# Patient Record
Sex: Male | Born: 1945 | Race: White | Hispanic: No | Marital: Single | Smoking: Former smoker
Health system: Southern US, Community
[De-identification: ages and names within clinical notes are randomized; demographics above are authoritative.]

## PROBLEM LIST (undated history)

## (undated) DIAGNOSIS — I10 Essential (primary) hypertension: Secondary | ICD-10-CM

## (undated) DIAGNOSIS — I493 Ventricular premature depolarization: Secondary | ICD-10-CM

## (undated) DIAGNOSIS — Z720 Tobacco use: Secondary | ICD-10-CM

## (undated) DIAGNOSIS — I2510 Atherosclerotic heart disease of native coronary artery without angina pectoris: Secondary | ICD-10-CM

## (undated) DIAGNOSIS — I70219 Atherosclerosis of native arteries of extremities with intermittent claudication, unspecified extremity: Secondary | ICD-10-CM

## (undated) DIAGNOSIS — I509 Heart failure, unspecified: Secondary | ICD-10-CM

## (undated) DIAGNOSIS — J309 Allergic rhinitis, unspecified: Secondary | ICD-10-CM

## (undated) DIAGNOSIS — E7800 Pure hypercholesterolemia, unspecified: Secondary | ICD-10-CM

## (undated) HISTORY — DX: Pure hypercholesterolemia, unspecified: E78.00

## (undated) HISTORY — DX: Allergic rhinitis, unspecified: J30.9

## (undated) HISTORY — DX: Tobacco use: Z72.0

## (undated) HISTORY — DX: Essential (primary) hypertension: I10

## (undated) HISTORY — DX: Ventricular premature depolarization: I49.3

## (undated) HISTORY — DX: Atherosclerotic heart disease of native coronary artery without angina pectoris: I25.10

## (undated) HISTORY — DX: Atherosclerosis of native arteries of extremities with intermittent claudication, unspecified extremity: I70.219

---

## 1998-03-16 DIAGNOSIS — I2510 Atherosclerotic heart disease of native coronary artery without angina pectoris: Secondary | ICD-10-CM

## 1998-03-16 HISTORY — DX: Atherosclerotic heart disease of native coronary artery without angina pectoris: I25.10

## 1998-03-16 HISTORY — PX: CORONARY ARTERY BYPASS GRAFT: SHX141

## 1998-03-31 ENCOUNTER — Inpatient Hospital Stay (HOSPITAL_COMMUNITY): Admission: EM | Admit: 1998-03-31 | Discharge: 1998-05-21 | Payer: Self-pay | Admitting: Emergency Medicine

## 1998-04-01 ENCOUNTER — Encounter: Payer: Self-pay | Admitting: Cardiothoracic Surgery

## 1998-04-01 ENCOUNTER — Encounter: Payer: Self-pay | Admitting: Thoracic Surgery (Cardiothoracic Vascular Surgery)

## 1998-04-02 ENCOUNTER — Encounter: Payer: Self-pay | Admitting: Thoracic Surgery (Cardiothoracic Vascular Surgery)

## 1998-04-03 ENCOUNTER — Encounter: Payer: Self-pay | Admitting: Cardiothoracic Surgery

## 1998-04-04 ENCOUNTER — Encounter: Payer: Self-pay | Admitting: Cardiothoracic Surgery

## 1998-04-05 ENCOUNTER — Encounter: Payer: Self-pay | Admitting: Cardiothoracic Surgery

## 1998-04-05 ENCOUNTER — Encounter: Payer: Self-pay | Admitting: Emergency Medicine

## 1998-04-06 ENCOUNTER — Encounter: Payer: Self-pay | Admitting: Cardiothoracic Surgery

## 1998-04-07 ENCOUNTER — Encounter: Payer: Self-pay | Admitting: Thoracic Surgery (Cardiothoracic Vascular Surgery)

## 1998-04-08 ENCOUNTER — Encounter: Payer: Self-pay | Admitting: Cardiothoracic Surgery

## 1998-04-09 ENCOUNTER — Encounter: Payer: Self-pay | Admitting: Cardiothoracic Surgery

## 1998-04-10 ENCOUNTER — Encounter: Payer: Self-pay | Admitting: Cardiothoracic Surgery

## 1998-04-11 ENCOUNTER — Encounter: Payer: Self-pay | Admitting: Cardiothoracic Surgery

## 1998-04-12 ENCOUNTER — Encounter: Payer: Self-pay | Admitting: Cardiothoracic Surgery

## 1998-04-13 ENCOUNTER — Encounter: Payer: Self-pay | Admitting: Cardiothoracic Surgery

## 1998-04-14 ENCOUNTER — Encounter: Payer: Self-pay | Admitting: Thoracic Surgery (Cardiothoracic Vascular Surgery)

## 1998-04-14 ENCOUNTER — Encounter: Payer: Self-pay | Admitting: Cardiothoracic Surgery

## 1998-04-15 ENCOUNTER — Encounter: Payer: Self-pay | Admitting: Cardiothoracic Surgery

## 1998-04-16 ENCOUNTER — Encounter: Payer: Self-pay | Admitting: Cardiothoracic Surgery

## 1998-04-17 ENCOUNTER — Encounter: Payer: Self-pay | Admitting: Cardiothoracic Surgery

## 1998-04-18 ENCOUNTER — Encounter: Payer: Self-pay | Admitting: Cardiothoracic Surgery

## 1998-04-19 ENCOUNTER — Encounter: Payer: Self-pay | Admitting: Cardiothoracic Surgery

## 1998-04-20 ENCOUNTER — Encounter: Payer: Self-pay | Admitting: Cardiothoracic Surgery

## 1998-04-21 ENCOUNTER — Encounter: Payer: Self-pay | Admitting: Cardiothoracic Surgery

## 1998-04-21 ENCOUNTER — Encounter: Payer: Self-pay | Admitting: Pulmonary Disease

## 1998-04-22 ENCOUNTER — Encounter: Payer: Self-pay | Admitting: Cardiothoracic Surgery

## 1998-04-23 ENCOUNTER — Encounter: Payer: Self-pay | Admitting: Thoracic Surgery (Cardiothoracic Vascular Surgery)

## 1998-04-24 ENCOUNTER — Encounter: Payer: Self-pay | Admitting: Cardiothoracic Surgery

## 1998-04-25 ENCOUNTER — Encounter: Payer: Self-pay | Admitting: Cardiothoracic Surgery

## 1998-04-27 ENCOUNTER — Encounter: Payer: Self-pay | Admitting: Cardiothoracic Surgery

## 1998-05-01 ENCOUNTER — Encounter: Payer: Self-pay | Admitting: Pulmonary Disease

## 1998-05-15 ENCOUNTER — Encounter: Payer: Self-pay | Admitting: Cardiothoracic Surgery

## 1998-05-21 ENCOUNTER — Encounter: Payer: Self-pay | Admitting: Pulmonary Disease

## 1998-05-21 ENCOUNTER — Inpatient Hospital Stay: Admission: RE | Admit: 1998-05-21 | Discharge: 1998-06-05 | Payer: Self-pay | Admitting: Cardiology

## 1998-06-06 ENCOUNTER — Encounter: Admission: RE | Admit: 1998-06-06 | Discharge: 1998-06-24 | Payer: Self-pay | Admitting: Cardiothoracic Surgery

## 1998-06-26 ENCOUNTER — Encounter: Admission: RE | Admit: 1998-06-26 | Discharge: 1998-09-24 | Payer: Self-pay | Admitting: Cardiothoracic Surgery

## 1998-09-10 ENCOUNTER — Encounter (HOSPITAL_COMMUNITY): Admission: RE | Admit: 1998-09-10 | Discharge: 1998-12-09 | Payer: Self-pay | Admitting: Cardiothoracic Surgery

## 1998-12-02 ENCOUNTER — Encounter (HOSPITAL_COMMUNITY): Admission: RE | Admit: 1998-12-02 | Discharge: 1999-03-02 | Payer: Self-pay | Admitting: Cardiothoracic Surgery

## 1999-03-03 ENCOUNTER — Encounter (HOSPITAL_COMMUNITY): Admission: RE | Admit: 1999-03-03 | Discharge: 1999-06-01 | Payer: Self-pay | Admitting: Cardiothoracic Surgery

## 1999-06-02 ENCOUNTER — Encounter (HOSPITAL_COMMUNITY): Admission: RE | Admit: 1999-06-02 | Discharge: 1999-08-31 | Payer: Self-pay | Admitting: Cardiothoracic Surgery

## 2000-11-18 ENCOUNTER — Inpatient Hospital Stay (HOSPITAL_COMMUNITY): Admission: AC | Admit: 2000-11-18 | Discharge: 2000-11-24 | Payer: Self-pay

## 2000-11-18 ENCOUNTER — Encounter: Payer: Self-pay | Admitting: *Deleted

## 2000-11-19 ENCOUNTER — Encounter: Payer: Self-pay | Admitting: General Surgery

## 2000-11-20 ENCOUNTER — Encounter: Payer: Self-pay | Admitting: General Surgery

## 2000-11-23 ENCOUNTER — Encounter: Payer: Self-pay | Admitting: Neurological Surgery

## 2000-12-24 ENCOUNTER — Encounter: Payer: Self-pay | Admitting: Neurological Surgery

## 2000-12-24 ENCOUNTER — Ambulatory Visit (HOSPITAL_COMMUNITY): Admission: RE | Admit: 2000-12-24 | Discharge: 2000-12-24 | Payer: Self-pay | Admitting: Neurological Surgery

## 2004-11-04 ENCOUNTER — Ambulatory Visit (HOSPITAL_COMMUNITY): Admission: RE | Admit: 2004-11-04 | Discharge: 2004-11-04 | Payer: Self-pay | Admitting: Gastroenterology

## 2006-01-19 ENCOUNTER — Ambulatory Visit: Payer: Self-pay | Admitting: Oncology

## 2013-01-01 ENCOUNTER — Other Ambulatory Visit: Payer: Self-pay | Admitting: Cardiology

## 2013-01-03 ENCOUNTER — Encounter: Payer: Self-pay | Admitting: Cardiology

## 2013-01-03 ENCOUNTER — Encounter: Payer: Self-pay | Admitting: *Deleted

## 2013-01-04 ENCOUNTER — Encounter: Payer: Self-pay | Admitting: Cardiology

## 2013-01-04 DIAGNOSIS — I2510 Atherosclerotic heart disease of native coronary artery without angina pectoris: Secondary | ICD-10-CM | POA: Insufficient documentation

## 2013-01-04 DIAGNOSIS — Z720 Tobacco use: Secondary | ICD-10-CM | POA: Insufficient documentation

## 2013-01-04 DIAGNOSIS — E785 Hyperlipidemia, unspecified: Secondary | ICD-10-CM | POA: Insufficient documentation

## 2013-01-04 DIAGNOSIS — I10 Essential (primary) hypertension: Secondary | ICD-10-CM | POA: Insufficient documentation

## 2013-01-05 ENCOUNTER — Encounter (INDEPENDENT_AMBULATORY_CARE_PROVIDER_SITE_OTHER): Payer: Self-pay

## 2013-01-05 ENCOUNTER — Ambulatory Visit (INDEPENDENT_AMBULATORY_CARE_PROVIDER_SITE_OTHER): Payer: Medicare Other | Admitting: Cardiology

## 2013-01-05 ENCOUNTER — Encounter: Payer: Self-pay | Admitting: Cardiology

## 2013-01-05 VITALS — BP 118/64 | HR 49 | Ht 63.0 in | Wt 124.0 lb

## 2013-01-05 DIAGNOSIS — F17200 Nicotine dependence, unspecified, uncomplicated: Secondary | ICD-10-CM

## 2013-01-05 DIAGNOSIS — I25810 Atherosclerosis of coronary artery bypass graft(s) without angina pectoris: Secondary | ICD-10-CM

## 2013-01-05 DIAGNOSIS — I10 Essential (primary) hypertension: Secondary | ICD-10-CM

## 2013-01-05 DIAGNOSIS — I2510 Atherosclerotic heart disease of native coronary artery without angina pectoris: Secondary | ICD-10-CM

## 2013-01-05 DIAGNOSIS — Z720 Tobacco use: Secondary | ICD-10-CM

## 2013-01-05 DIAGNOSIS — E7800 Pure hypercholesterolemia, unspecified: Secondary | ICD-10-CM

## 2013-01-05 NOTE — Patient Instructions (Addendum)
Your physician recommends that you continue on your current medications as directed. Please refer to the Current Medication list given to you today. Your physician wants you to follow-up in: 12 months with Dr. Turner.  You will receive a reminder letter in the mail two months in advance. If you don't receive a letter, please call our office to schedule the follow-up appointment.  

## 2013-01-05 NOTE — Progress Notes (Signed)
[address] [place], [place]  [address] Phone: [phone] Fax:  [phone]  Date:  01/05/2013   ID:  Derrick Marsh, DOB September 10, 1945, MRN [id number]  PCP:  No primary provider on file.  Cardiologist:  Armanda Magic, MD     History of Present Illness: Derrick Marsh is a 67 y.o. male with a history of CAD s/p CABG, HTN, dyslipidemia and ongoing tobacco abuse.  He is doing well.  He denies any chest pain, SOB, DOE, LEedema, dizziness, palpitations or syncope.   Wt Readings from Last 3 Encounters:  No data found for Wt     Past Medical History  Diagnosis Date  . Allergic rhinitis   . Atherosclerosis of native arteries of the extremities with intermittent claudication   . Tobacco abuse   . CAD (coronary artery disease) 2000    s/p acute IWMI with vfib arrest s/p CABG with LIMA to LAD, SVG to OM, SVG to RCA  . Hypercholesteremia   . HTN (hypertension)     Current Outpatient Prescriptions  Medication Sig Dispense Refill  . albuterol (PROVENTIL HFA;VENTOLIN HFA) 108 (90 BASE) MCG/ACT inhaler Inhale 2 puffs into the lungs every 4 (four) hours as needed for wheezing.      Marland Kitchen aspirin 325 MG tablet Take 325 mg by mouth daily.      Marland Kitchen atorvastatin (LIPITOR) 40 MG tablet TAKE 1 TABLET BY MOUTH EVERY DAY  30 tablet  11  . colchicine 0.6 MG tablet Take 0.6 mg by mouth daily.      Marland Kitchen ezetimibe (ZETIA) 10 MG tablet Take 10 mg by mouth daily.      . metoprolol tartrate (LOPRESSOR) 25 MG tablet Take 25 mg by mouth 2 (two) times daily.      . tadalafil (CIALIS) 20 MG tablet Take 20 mg by mouth daily as needed for erectile dysfunction.      . traMADol-acetaminophen (ULTRACET) 37.5-325 MG per tablet Take 1 tablet by mouth every 6 (six) hours as needed for pain.       No current facility-administered medications for this visit.    Allergies:   No Known Allergies  Social History:  The patient  reports that he has been smoking.  He does not have any smokeless tobacco history on  file. He reports that he does not drink alcohol or use illicit drugs.   Family History:  The patient's family history includes CVA in his father; Emphysema in his father; Heart disease in his father; Heart failure in his mother.   ROS:  Please see the history of present illness.      All other systems reviewed and negative.   PHYSICAL EXAM: VS:  There were no vitals taken for this visit. Well nourished, well developed, in no acute distress HEENT: normal Neck: no JVD Cardiac:  normal S1, S2; RRR; no murmur Lungs:  clear to auscultation bilaterally, no wheezing, rhonchi or rales Abd: soft, nontender, no hepatomegaly Ext: no edema Skin: warm and dry Neuro:  CNs 2-12 intact, no focal abnormalities noted   EKG:  Sinus bradycardia at 49bpm with incomplete LBBB  ASSESSMENT AND PLAN:  1. CAD s/p CABG with no angina  - continue ASA/metoprolol 2. HTN controlled  - continue metoprolol 3. Dyslipidemia  - continue Atorvastatin  - he stopped taking the Zetia - he says he feels better now  - he will have Dr. Nehemiah Settle send a copy of his lipids next month 4.  Tobacco abuse  -  we talked about quitting but he is not ready.  He is down to about 3 cigars daily  Followup with me in 1 year  Signed, Armanda Magic, MD 01/05/2013 11:12 AM

## 2013-01-22 ENCOUNTER — Other Ambulatory Visit: Payer: Self-pay | Admitting: Cardiology

## 2013-04-15 ENCOUNTER — Encounter: Payer: Self-pay | Admitting: *Deleted

## 2013-05-30 ENCOUNTER — Other Ambulatory Visit: Payer: Self-pay

## 2013-05-30 MED ORDER — METOPROLOL TARTRATE 25 MG PO TABS: ORAL_TABLET | ORAL | Status: DC

## 2013-05-30 MED ORDER — ATORVASTATIN CALCIUM 40 MG PO TABS: ORAL_TABLET | ORAL | Status: DC

## 2013-06-30 ENCOUNTER — Encounter: Payer: Self-pay | Admitting: Cardiology

## 2013-12-04 ENCOUNTER — Other Ambulatory Visit: Payer: Self-pay

## 2013-12-04 MED ORDER — ATORVASTATIN CALCIUM 40 MG PO TABS: ORAL_TABLET | ORAL | Status: DC

## 2014-01-09 ENCOUNTER — Ambulatory Visit (INDEPENDENT_AMBULATORY_CARE_PROVIDER_SITE_OTHER): Payer: Medicare Other | Admitting: Cardiology

## 2014-01-09 ENCOUNTER — Encounter: Payer: Self-pay | Admitting: Cardiology

## 2014-01-09 VITALS — BP 124/72 | HR 56 | Ht 63.0 in | Wt 118.0 lb

## 2014-01-09 DIAGNOSIS — E7800 Pure hypercholesterolemia, unspecified: Secondary | ICD-10-CM

## 2014-01-09 DIAGNOSIS — Z720 Tobacco use: Secondary | ICD-10-CM

## 2014-01-09 DIAGNOSIS — I2584 Coronary atherosclerosis due to calcified coronary lesion: Secondary | ICD-10-CM

## 2014-01-09 DIAGNOSIS — I10 Essential (primary) hypertension: Secondary | ICD-10-CM

## 2014-01-09 DIAGNOSIS — I2510 Atherosclerotic heart disease of native coronary artery without angina pectoris: Secondary | ICD-10-CM

## 2014-01-09 NOTE — Progress Notes (Signed)
[address] [place], [place]  [address] Phone: [phone] Fax:  [phone]  Date:  01/09/2014   ID:  Derrick Marsh, DOB 02-17-1946, MRN [id number]  PCP:  No primary provider on file.  Cardiologist:  Armanda Magicraci Kmya Placide, MD    History of Present Illness: Derrick Marsh is a 68 y.o. male with a history of CAD s/p CABG, HTN, dyslipidemia and ongoing tobacco abuse. He is doing well. He denies any chest pain, SOB, DOE, LEedema, dizziness, palpitations or syncope.    Wt Readings from Last 3 Encounters:  01/09/14 118 lb (53.524 kg)  01/05/13 124 lb (56.246 kg)     Past Medical History  Diagnosis Date  . Allergic rhinitis   . Atherosclerosis of native arteries of the extremities with intermittent claudication   . Tobacco abuse   . CAD (coronary artery disease) 2000    s/p acute IWMI with vfib arrest s/p CABG with LIMA to LAD, SVG to OM, SVG to RCA  . Hypercholesteremia   . HTN (hypertension)     Current Outpatient Prescriptions  Medication Sig Dispense Refill  . albuterol (PROVENTIL HFA;VENTOLIN HFA) 108 (90 BASE) MCG/ACT inhaler Inhale 2 puffs into the lungs every 4 (four) hours as needed for wheezing.      Marland Kitchen. aspirin 325 MG tablet Take 325 mg by mouth daily.      Marland Kitchen. atorvastatin (LIPITOR) 40 MG tablet TAKE 1 TABLET BY MOUTH EVERY DAY  90 tablet  1  . colchicine 0.6 MG tablet Take 0.6 mg by mouth daily.      . metoprolol tartrate (LOPRESSOR) 25 MG tablet TAKE 0.5 TABS BY MOUTH TWICE A DAY  90 tablet  1  . tadalafil (CIALIS) 20 MG tablet Take 20 mg by mouth daily as needed for erectile dysfunction.      . traMADol-acetaminophen (ULTRACET) 37.5-325 MG per tablet Take 1 tablet by mouth every 6 (six) hours as needed for pain.       No current facility-administered medications for this visit.    Allergies:   No Known Allergies  Social History:  The patient  reports that he has been smoking Cigars.  He does not have any smokeless tobacco history on file. He reports that he  does not drink alcohol or use illicit drugs.   Family History:  The patient's family history includes CVA in his father; Emphysema in his father; Heart disease in his father; Heart failure in his mother.   ROS:  Please see the history of present illness.      All other systems reviewed and negative.   PHYSICAL EXAM: VS:  BP 124/72  Pulse 56  Ht 5\' 3"  (1.6 m)  Wt 118 lb (53.524 kg)  BMI 20.91 kg/m2 Well nourished, well developed, in no acute distress HEENT: normal Neck: no JVD Cardiac:  normal S1, S2; RRR; no murmur Lungs:  clear to auscultation bilaterally, no wheezing, rhonchi or rales Abd: soft, nontender, no hepatomegaly Ext: no edema Skin: warm and dry Neuro:  CNs 2-12 intact, no focal abnormalities noted  EKG:     NSR with nonspecific T wave abnormality in inferolateral leads - unchanged from 1 year ago  ASSESSMENT AND PLAN:  1. CAD s/p CABG with no angina - continue ASA/metoprolol  2. HTN controlled - continue metoprolol  3. Dyslipidemia - continue Atorvastatin  - I will get his most recent lipids from his PCP 4. Tobacco abuse  - we talked about quitting but he is not  ready. He is down to about 3 cigars daily   Followup with me in 1 year    Signed, Armanda Magicraci Manual Navarra, MD [facility] 01/09/2014 3:37 PM

## 2014-01-09 NOTE — Patient Instructions (Signed)
Your physician wants you to follow-up in: 1 year with Dr. Turner. You will receive a reminder letter in the mail two months in advance. If you don't receive a letter, please call our office to schedule the follow-up appointment.  Your physician recommends that you continue on your current medications as directed. Please refer to the Current Medication list given to you today.  

## 2014-02-12 ENCOUNTER — Encounter: Payer: Self-pay | Admitting: Cardiology

## 2014-05-17 ENCOUNTER — Other Ambulatory Visit: Payer: Self-pay | Admitting: Cardiology

## 2014-05-23 ENCOUNTER — Other Ambulatory Visit: Payer: Self-pay | Admitting: Interventional Cardiology

## 2014-10-18 ENCOUNTER — Encounter: Payer: Self-pay | Admitting: Cardiology

## 2014-11-08 ENCOUNTER — Other Ambulatory Visit: Payer: Self-pay | Admitting: Cardiology

## 2014-11-18 ENCOUNTER — Other Ambulatory Visit: Payer: Self-pay | Admitting: Interventional Cardiology

## 2015-01-09 NOTE — Progress Notes (Signed)
Cardiology Office Note   Date:  01/10/2015   ID:  Derrick Marsh, DOB 1945-11-09, MRN [id number]  PCP:  Katy Apo, MD    Chief Complaint  Patient presents with  . Coronary Artery Disease  . Hypertension      History of Present Illness: Derrick Marsh is a 68 y.o. male with a history of CAD s/p CABG, HTN, dyslipidemia and ongoing tobacco abuse. He is doing well. He denies any chest pain, SOB (escept with allergies), DOE, LEedema, dizziness, palpitations or syncope.      Past Medical History  Diagnosis Date  . Allergic rhinitis   . Atherosclerosis of native arteries of the extremities with intermittent claudication   . Tobacco abuse   . CAD (coronary artery disease) 2000    s/p acute IWMI with vfib arrest s/p CABG with LIMA to LAD, SVG to OM, SVG to RCA  . Hypercholesteremia   . HTN (hypertension)     Past Surgical History  Procedure Laterality Date  . Coronary artery bypass graft  2000     Current Outpatient Prescriptions  Medication Sig Dispense Refill  . albuterol (PROVENTIL HFA;VENTOLIN HFA) 108 (90 BASE) MCG/ACT inhaler Inhale 2 puffs into the lungs every 4 (four) hours as needed for wheezing.    Marland Kitchen aspirin 325 MG tablet Take 325 mg by mouth daily.    Marland Kitchen atorvastatin (LIPITOR) 40 MG tablet TAKE 1 TABLET BY MOUTH EVERY DAY 90 tablet 0  . colchicine 0.6 MG tablet Take 0.6 mg by mouth daily.    . metoprolol tartrate (LOPRESSOR) 25 MG tablet TAKE 1/2 TABLET BY MOUTH TWICE DAILY 90 tablet 1   No current facility-administered medications for this visit.    Allergies:   Review of patient's allergies indicates no known allergies.    Social History:  The patient  reports that he has been smoking Cigars.  He does not have any smokeless tobacco history on file. He reports that he does not drink alcohol or use illicit drugs.   Family History:  The patient's family history includes CVA in his father; Emphysema in his father; Heart disease in his  father; Heart failure in his mother.    ROS:  Please see the history of present illness.   Otherwise, review of systems are positive for none.   All other systems are reviewed and negative.    PHYSICAL EXAM: VS:  BP 140/72 mmHg  Pulse 56  Ht  (1.575 m)  Wt 115 lb (52.164 kg)  BMI 21.03 kg/m2 , BMI Body mass index is 21.03 kg/(m^2). GEN: Well nourished, well developed, in no acute distress HEENT: normal Neck: no JVD, carotid bruits, or masses Cardiac: RRR; no murmurs, rubs, or gallops,no edema  Respiratory:  clear to auscultation bilaterally, normal work of breathing GI: soft, nontender, nondistended, + BS MS: no deformity or atrophy Skin: warm and dry, no rash Neuro:  Strength and sensation are intact Psych: euthymic mood, full affect   EKG:  EKG was ordered today and showed sinus bradycardia at 56bpm with nonspecific IVCD unchanged from EKG 2015   Recent Labs: No results found for requested labs within last 365 days.    Lipid Panel No results found for: CHOL, TRIG, HDL, CHOLHDL, VLDL, LDLCALC, LDLDIRECT    Wt Readings from Last 3 Encounters:  01/10/15 115 lb (52.164 kg)  01/09/14 118 lb (53.524 kg)  01/05/13 124 lb (56.246  kg)    ASSESSMENT AND PLAN:  1.  CAD s/p CABG with no angina - continue ASA/metoprolol/statin 2.  HTN controlled - continue metoprolol 3.  Dyslipidemia - continue Atorvastatin  - I will check an FLP and ALT today 4. Tobacco abuse  - we talked about quitting but he is not ready. He is down to about 3 cigars daily  5.  CKD - he was supposed to see a renal MD but did not go.  I will check a BMET today.      Current medicines are reviewed at length with the patient today.  The patient does not have concerns regarding medicines.  The following changes have been made:  no change  Labs/ tests ordered today: See above Assessment and Plan No orders of the defined types were placed in this encounter.     Disposition:   FU with me in 1  year  Signed, Quintella ReichertURNER,Gabrielle Wakeland R, MD  01/10/2015 11:25 AM    [facility] [address], [place], [place]  [address] Phone: [phone](336) [phone]; Fax: [phone]

## 2015-01-10 ENCOUNTER — Ambulatory Visit (INDEPENDENT_AMBULATORY_CARE_PROVIDER_SITE_OTHER): Payer: Medicare Other | Admitting: Cardiology

## 2015-01-10 ENCOUNTER — Encounter: Payer: Self-pay | Admitting: Cardiology

## 2015-01-10 VITALS — BP 140/72 | HR 56 | Ht 62.0 in | Wt 115.0 lb

## 2015-01-10 DIAGNOSIS — Z720 Tobacco use: Secondary | ICD-10-CM

## 2015-01-10 DIAGNOSIS — I2583 Coronary atherosclerosis due to lipid rich plaque: Principal | ICD-10-CM

## 2015-01-10 DIAGNOSIS — I2510 Atherosclerotic heart disease of native coronary artery without angina pectoris: Secondary | ICD-10-CM

## 2015-01-10 DIAGNOSIS — E7800 Pure hypercholesterolemia, unspecified: Secondary | ICD-10-CM | POA: Diagnosis not present

## 2015-01-10 DIAGNOSIS — I10 Essential (primary) hypertension: Secondary | ICD-10-CM | POA: Diagnosis not present

## 2015-01-10 LAB — BASIC METABOLIC PANEL
BUN: 15 mg/dL (ref 7–25)
CO2: 27 mmol/L (ref 20–31)
Calcium: 9.4 mg/dL (ref 8.6–10.3)
Chloride: 107 mmol/L (ref 98–110)
Creat: 1.64 mg/dL — ABNORMAL HIGH (ref 0.70–1.25)
GLUCOSE: 92 mg/dL (ref 65–99)
Potassium: 4.5 mmol/L (ref 3.5–5.3)
Sodium: 141 mmol/L (ref 135–146)

## 2015-01-10 LAB — HEPATIC FUNCTION PANEL
ALBUMIN: 3.9 g/dL (ref 3.6–5.1)
ALT: 13 U/L (ref 9–46)
AST: 18 U/L (ref 10–35)
Alkaline Phosphatase: 97 U/L (ref 40–115)
Bilirubin, Direct: 0.1 mg/dL (ref <=0.2)
Indirect Bilirubin: 0.3 mg/dL (ref 0.2–1.2)
TOTAL PROTEIN: 7.2 g/dL (ref 6.1–8.1)
Total Bilirubin: 0.4 mg/dL (ref 0.2–1.2)

## 2015-01-10 LAB — LIPID PANEL
CHOLESTEROL: 131 mg/dL (ref 125–200)
HDL: 43 mg/dL (ref >=40)
LDL Cholesterol: 69 mg/dL (ref <130)
Total CHOL/HDL Ratio: 3 Ratio (ref <=5.0)
Triglycerides: 95 mg/dL (ref <150)
VLDL: 19 mg/dL (ref <30)

## 2015-01-10 NOTE — Patient Instructions (Addendum)

## 2015-02-16 ENCOUNTER — Other Ambulatory Visit: Payer: Self-pay | Admitting: Cardiology

## 2015-02-25 ENCOUNTER — Other Ambulatory Visit: Payer: Self-pay | Admitting: Nephrology

## 2015-02-25 DIAGNOSIS — N183 Chronic kidney disease, stage 3 unspecified: Secondary | ICD-10-CM

## 2015-02-28 ENCOUNTER — Ambulatory Visit
Admission: RE | Admit: 2015-02-28 | Discharge: 2015-02-28 | Disposition: A | Discharge status: Home / Self Care | Payer: Medicare Other | Source: Ambulatory Visit | Attending: Nephrology | Admitting: Nephrology

## 2015-02-28 DIAGNOSIS — N183 Chronic kidney disease, stage 3 unspecified: Secondary | ICD-10-CM

## 2015-04-09 ENCOUNTER — Other Ambulatory Visit: Payer: Self-pay | Admitting: Nephrology

## 2015-04-09 DIAGNOSIS — N183 Chronic kidney disease, stage 3 unspecified: Secondary | ICD-10-CM

## 2015-04-11 ENCOUNTER — Other Ambulatory Visit: Payer: Medicare Other

## 2015-05-02 ENCOUNTER — Other Ambulatory Visit: Payer: Self-pay | Admitting: Cardiology

## 2015-09-09 ENCOUNTER — Ambulatory Visit
Admission: RE | Admit: 2015-09-09 | Discharge: 2015-09-09 | Disposition: A | Discharge status: Home / Self Care | Payer: Medicare Other | Source: Ambulatory Visit | Attending: Nephrology | Admitting: Nephrology

## 2015-09-09 DIAGNOSIS — N183 Chronic kidney disease, stage 3 unspecified: Secondary | ICD-10-CM

## 2016-01-10 ENCOUNTER — Encounter (INDEPENDENT_AMBULATORY_CARE_PROVIDER_SITE_OTHER): Payer: Self-pay

## 2016-01-10 ENCOUNTER — Encounter: Payer: Self-pay | Admitting: Cardiology

## 2016-01-10 ENCOUNTER — Ambulatory Visit (INDEPENDENT_AMBULATORY_CARE_PROVIDER_SITE_OTHER): Payer: Medicare Other | Admitting: Cardiology

## 2016-01-10 VITALS — BP 134/80 | HR 62 | Ht 62.0 in | Wt 118.4 lb

## 2016-01-10 DIAGNOSIS — I10 Essential (primary) hypertension: Secondary | ICD-10-CM

## 2016-01-10 DIAGNOSIS — E7800 Pure hypercholesterolemia, unspecified: Secondary | ICD-10-CM

## 2016-01-10 DIAGNOSIS — I2510 Atherosclerotic heart disease of native coronary artery without angina pectoris: Secondary | ICD-10-CM

## 2016-01-10 DIAGNOSIS — I493 Ventricular premature depolarization: Secondary | ICD-10-CM | POA: Insufficient documentation

## 2016-01-10 LAB — HEPATIC FUNCTION PANEL
ALBUMIN: 4 g/dL (ref 3.6–5.1)
ALT: 11 U/L (ref 9–46)
AST: 19 U/L (ref 10–35)
Alkaline Phosphatase: 84 U/L (ref 40–115)
Bilirubin, Direct: 0.1 mg/dL (ref <=0.2)
Indirect Bilirubin: 0.4 mg/dL (ref 0.2–1.2)
TOTAL PROTEIN: 6.9 g/dL (ref 6.1–8.1)
Total Bilirubin: 0.5 mg/dL (ref 0.2–1.2)

## 2016-01-10 LAB — LIPID PANEL
CHOLESTEROL: 128 mg/dL (ref 125–200)
HDL: 52 mg/dL (ref >=40)
LDL Cholesterol: 65 mg/dL (ref <130)
Total CHOL/HDL Ratio: 2.5 Ratio (ref <=5.0)
Triglycerides: 54 mg/dL (ref <150)
VLDL: 11 mg/dL (ref <30)

## 2016-01-10 NOTE — Patient Instructions (Signed)
Medication Instructions:  Your physician recommends that you continue on your current medications as directed. Please refer to the Current Medication list given to you today.   Labwork: TODAY: LFTs, Lipids  Testing/Procedures: None  Follow-Up: Your physician wants you to follow-up in: 1 year with Dr. Turner. You will receive a reminder letter in the mail two months in advance. If you don't receive a letter, please call our office to schedule the follow-up appointment.   Any Other Special Instructions Will Be Listed Below (If Applicable).     If you need a refill on your cardiac medications before your next appointment, please call your pharmacy.   

## 2016-01-10 NOTE — Progress Notes (Signed)
Cardiology Office Note    Date:  01/10/2016   ID:  Derrick Marsh, DOB [birth year], MRN [id number]  PCP:  Katy ApoPOLITE,RONALD D, MD  Cardiologist:  Armanda Magicraci Turner, MD   Chief Complaint  Patient presents with  . Hypertension  . Hyperlipidemia  . Coronary Artery Disease    History of Present Illness:  Derrick Marsh is a 70 y.o. male with a history of CAD s/p CABG, HTN, dyslipidemia and ongoing tobacco abuse. He is doing well. He denies any chest pain, SOB (except with allergies or extreme exertion), PND, orthopnea, LEedema, dizziness, palpitations or syncope.    Past Medical History:  Diagnosis Date  . Allergic rhinitis   . Atherosclerosis of native arteries of the extremities with intermittent claudication   . 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 HTN (hypertension)   . Hypercholesteremia   . PVC's (premature ventricular contractions)   . Tobacco abuse     Past Surgical History:  Procedure Laterality Date  . CORONARY ARTERY BYPASS GRAFT  2000    Current Medications: Outpatient Medications Prior to Visit  Medication Sig Dispense Refill  . aspirin 325 MG tablet Take 325 mg by mouth daily.    Marland Kitchen. atorvastatin (LIPITOR) 40 MG tablet TAKE 1 TABLET BY MOUTH EVERY DAY 90 tablet 3  . colchicine 0.6 MG tablet Take 0.6 mg by mouth daily.    . metoprolol tartrate (LOPRESSOR) 25 MG tablet TAKE 1/2 TABLET BY MOUTH TWICE DAILY 90 tablet 2  . albuterol (PROVENTIL HFA;VENTOLIN HFA) 108 (90 BASE) MCG/ACT inhaler Inhale 2 puffs into the lungs every 4 (four) hours as needed for wheezing.     No facility-administered medications prior to visit.      Allergies:   Review of patient's allergies indicates no known allergies.   Social History   Social History  . Marital status: Single    Spouse name: N/A  . Number of children: N/A  . Years of education: N/A   Social History Main Topics  . Smoking status:  Current Some Day Smoker    Packs/day: 0.50    Types: Cigars  . Smokeless tobacco: Never Used  . Alcohol use No  . Drug use: No  . Sexual activity: Not Asked   Other Topics Concern  . None   Social History Narrative  . None     Family History:  The patient's family history includes CVA in his father; Emphysema in his father; Heart disease in his father; Heart failure in his mother.   ROS:   Please see the history of present illness.    ROS All other systems reviewed and are negative.  No flowsheet data found.     PHYSICAL EXAM:   VS:  BP 134/80   Pulse 62   Ht 5\' 2"  (1.575 m)   Wt 118 lb 6.4 oz (53.7 kg)   BMI 21.66 kg/m    GEN: Well nourished, well developed, in no acute distress  HEENT: normal  Neck: no JVD, carotid bruits, or masses Cardiac: RRR; no murmurs, rubs, or gallops,no edema.  Intact distal pulses bilaterally.  Respiratory:  clear to auscultation bilaterally, normal work of breathing GI: soft, nontender, nondistended, + BS MS: no deformity or atrophy  Skin: warm and dry, no rash Neuro:  Alert and Oriented x 3, Strength and sensation are intact Psych: euthymic mood, full affect  Wt Readings  from Last 3 Encounters:  01/10/16 118 lb 6.4 oz (53.7 kg)  01/10/15 115 lb (52.2 kg)  01/09/14 118 lb (53.5 kg)      Studies/Labs Reviewed:   EKG:  EKG is  ordered today.  The ekg ordered today demonstrates NSR at 62bpm with PVCs and LVH with repolarization abnormality unchanged from 12/2014  Recent Labs: 01/10/2015: ALT 13; BUN 15; Creat 1.64; Potassium 4.5; Sodium 141   Lipid Panel    Component Value Date/Time   CHOL 131 01/10/2015 1148   TRIG 95 01/10/2015 1148   HDL 43 01/10/2015 1148   CHOLHDL 3.0 01/10/2015 1148   VLDL 19 01/10/2015 1148   LDLCALC 69 01/10/2015 1148    Additional studies/ records that were reviewed today include:  none    ASSESSMENT:    1. Coronary artery disease involving native coronary artery of native heart without  angina pectoris   2. Essential hypertension   3. Hypercholesteremia   4. PVC's (premature ventricular contractions)      PLAN:  In order of problems listed above:  1. ASCAD s/p remote CABG.  He has no anginal symptoms.  Continue ASA/BB and statin.  Decrease ASA to 81mg  daily.   2. HTN - BP controlled on current meds.  Continue BB. 3. Hyperlipidemia - LDL goal < 70.  Continue statin.  Check FLP and ALT.    Medication Adjustments/Labs and Tests Ordered: Current medicines are reviewed at length with the patient today.  Concerns regarding medicines are outlined above.  Medication changes, Labs and Tests ordered today are listed in the Patient Instructions below.  There are no Patient Instructions on file for this visit.   Signed, Armanda Magic, MD  01/10/2016 10:07 AM    [facility] [address] [place], [place], [place]  [address] Phone: [phone]; Fax: [phone]

## 2016-01-16 ENCOUNTER — Other Ambulatory Visit: Payer: Self-pay | Admitting: Cardiology

## 2016-01-31 ENCOUNTER — Other Ambulatory Visit: Payer: Self-pay | Admitting: Cardiology

## 2016-12-27 ENCOUNTER — Other Ambulatory Visit: Payer: Self-pay | Admitting: Cardiology

## 2017-01-06 ENCOUNTER — Encounter: Payer: Self-pay | Admitting: Cardiology

## 2017-01-06 ENCOUNTER — Ambulatory Visit (INDEPENDENT_AMBULATORY_CARE_PROVIDER_SITE_OTHER): Payer: Medicare Other | Admitting: Cardiology

## 2017-01-06 VITALS — BP 134/72 | HR 57 | Ht 62.0 in | Wt 111.0 lb

## 2017-01-06 DIAGNOSIS — I10 Essential (primary) hypertension: Secondary | ICD-10-CM

## 2017-01-06 DIAGNOSIS — I493 Ventricular premature depolarization: Secondary | ICD-10-CM

## 2017-01-06 DIAGNOSIS — Z23 Encounter for immunization: Secondary | ICD-10-CM | POA: Diagnosis not present

## 2017-01-06 DIAGNOSIS — E7800 Pure hypercholesterolemia, unspecified: Secondary | ICD-10-CM | POA: Diagnosis not present

## 2017-01-06 DIAGNOSIS — I2510 Atherosclerotic heart disease of native coronary artery without angina pectoris: Secondary | ICD-10-CM

## 2017-01-06 NOTE — Patient Instructions (Signed)
Your physician recommends that you continue on your current medications as directed. Please refer to the Current Medication list given to you today.  Your physician wants you to follow-up in: 1 year with Dr. Turner. You will receive a reminder letter in the mail two months in advance. If you don't receive a letter, please call our office to schedule the follow-up appointment.  

## 2017-01-06 NOTE — Progress Notes (Signed)
Cardiology Office Note:    Date:  01/06/2017   ID:  Derrick Marsh, DOB 09-Aug-1945, MRN [id number]  PCP:  Renford Dills, MD  Cardiologist:  Armanda Magic, MD   Referring MD: Renford Dills, MD   Chief Complaint  Patient presents with  . Coronary Artery Disease  . Hypertension  . Hyperlipidemia    History of Present Illness:    Derrick Marsh is a 71 y.o. male with a hx of CAD s/p CABG, HTN, dyslipidemia and ongoing tobacco abuse.  He is here today for followup and is doing well.  He denies any chest pain or pressure, SOB, DOE ( except with extreme exertion), PND, orthopnea, LE edema, dizziness, palpitations or syncope. He is compliant with his meds and is tolerating meds with no SE.    Past Medical History:  Diagnosis Date  . Allergic rhinitis   . Atherosclerosis of native arteries of the extremities with intermittent claudication   . 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 HTN (hypertension)   . Hypercholesteremia   . PVC's (premature ventricular contractions)   . Tobacco abuse     Past Surgical History:  Procedure Laterality Date  . CORONARY ARTERY BYPASS GRAFT  2000    Current Medications: Current Meds  Medication Sig  . aspirin EC 81 MG tablet Take 81 mg by mouth daily.  Marland Kitchen atorvastatin (LIPITOR) 40 MG tablet TAKE 1 TABLET BY MOUTH EVERY DAY  . colchicine 0.6 MG tablet Take 0.6 mg by mouth daily.  . metoprolol tartrate (LOPRESSOR) 25 MG tablet TAKE 1/2 TABLET BY MOUTH TWICE DAILY     Allergies:   Patient has no known allergies.   Social History   Social History  . Marital status: Single    Spouse name: N/A  . Number of children: N/A  . Years of education: N/A   Social History Main Topics  . Smoking status: Current Some Day Smoker    Packs/day: 0.50    Types: Cigars  . Smokeless tobacco: Never Used  . Alcohol use No  . Drug use: No  . Sexual activity: Not Asked   Other  Topics Concern  . None   Social History Narrative  . None     Family History: The patient's family history includes CVA in his father; Emphysema in his father; Heart disease in his father; Heart failure in his mother.  ROS:   Please see the history of present illness.    ROS  All other systems reviewed and negative.   EKGs/Labs/Other Studies Reviewed:    The following studies were reviewed today: none  EKG:  EKG is  ordered today.  The ekg ordered today demonstrates Sinus bradycardia at 57bpm with low voltage in limb lieads and inferior infarct and nonspecific T wave abnormality  Recent Labs: 01/10/2016: ALT 11   Recent Lipid Panel    Component Value Date/Time   CHOL 128 01/10/2016 1023   TRIG 54 01/10/2016 1023   HDL 52 01/10/2016 1023   CHOLHDL 2.5 01/10/2016 1023   VLDL 11 01/10/2016 1023   LDLCALC 65 01/10/2016 1023    Physical Exam:    VS:  BP 134/72   Pulse (!) 57   Ht 5\' 2"  (1.575 m)   Wt 111 lb (50.3 kg)   BMI 20.30 kg/m     Wt Readings from Last 3 Encounters:  01/06/17 111 lb (  50.3 kg)  01/10/16 118 lb 6.4 oz (53.7 kg)  01/10/15 115 lb (52.2 kg)     GEN:  Well nourished, well developed in no acute distress HEENT: Normal NECK: No JVD; No carotid bruits LYMPHATICS: No lymphadenopathy CARDIAC: RRR, no murmurs, rubs, gallops RESPIRATORY:  Clear to auscultation without rales, wheezing or rhonchi  ABDOMEN: Soft, non-tender, non-distended MUSCULOSKELETAL:  No edema; No deformity  SKIN: Warm and dry NEUROLOGIC:  Alert and oriented x 3 PSYCHIATRIC:  Normal affect   ASSESSMENT:    1. Coronary artery disease involving native coronary artery of native heart without angina pectoris   2. Essential hypertension   3. PVC's (premature ventricular contractions)   4. Hypercholesteremia    PLAN:    In order of problems listed above:  1.  ASCAD - s/p remote CABG.  He has no anginal chest pain.  He will continue on ASA 81mg  daily, metoprolol 25mg  BID and  statin.    2.  HTN - BP is well controlled on exam today. He will continue on current dose of BB.  3.  PVC's - asymptomatic and controlled on BB.  4.  Hyperlipidemia with LDL goal < 70.  He will continue on Atorvastatin 40mg  daily. His LDL at 71 on 09/02/2016   Medication Adjustments/Labs and Tests Ordered: Current medicines are reviewed at length with the patient today.  Concerns regarding medicines are outlined above.  No orders of the defined types were placed in this encounter.  No orders of the defined types were placed in this encounter.   Signed, Armanda Magicraci Turner, MD  01/06/2017 10:14 AM    [facility]

## 2017-01-08 NOTE — Addendum Note (Signed)
Addended by: Madalyn RobOX, Johannes Everage A on: 01/08/2017 10:50 AM   Modules accepted: Orders

## 2017-01-11 ENCOUNTER — Other Ambulatory Visit: Payer: Self-pay | Admitting: Cardiology

## 2017-03-16 DIAGNOSIS — I484 Atypical atrial flutter: Secondary | ICD-10-CM

## 2017-03-16 HISTORY — DX: Atypical atrial flutter: I48.4

## 2017-08-26 ENCOUNTER — Telehealth: Payer: Self-pay | Admitting: Cardiology

## 2017-08-26 NOTE — Telephone Encounter (Signed)
Please have him see his PCP tomorrow to make sure he is not in atrial fibrillation

## 2017-08-26 NOTE — Telephone Encounter (Signed)
Pt is c/o sob, congestion and palpitations that has been ongoing since the start of spring. For the past week he has had some increased palpitations that he describes as flutters. He stated he has been taking mucinex-D and Bronkaid for the past 1 week to help with congestion. I advised patient that he should avoid medication with decongestants because they increase palpitations. Pt denies chest pain, dizziness, weight gain or swelling. I advised pt to stop mucinex-D and call his primary MD to schedule a f/u appt for early next week and if he develops chest pain or sob gets worse to go ED or urgent care to be evaluated. He states he will call today and thankful for the call.

## 2017-08-26 NOTE — Telephone Encounter (Signed)
Left a message informing pt to schedule appt with PCP for tomorrow to make sure he is not in atrial fibrillation and call back to confirm.

## 2017-08-26 NOTE — Telephone Encounter (Signed)
New Message   Pt c/o Shortness Of Breath: STAT if SOB developed within the last 24 hours or pt is noticeably SOB on the phone  1. Are you currently SOB (can you hear that pt is SOB on the phone)? no  2. How long have you been experiencing SOB? When spring first started and the pollen came in  3. Are you SOB when sitting or when up moving around? both  4. Are you currently experiencing any other symptoms? Headache every once in a while and weakness

## 2017-08-27 ENCOUNTER — Other Ambulatory Visit: Payer: Self-pay

## 2017-08-27 ENCOUNTER — Emergency Department (HOSPITAL_COMMUNITY): Payer: Medicare Other

## 2017-08-27 ENCOUNTER — Encounter (HOSPITAL_COMMUNITY): Payer: Self-pay | Admitting: Emergency Medicine

## 2017-08-27 ENCOUNTER — Inpatient Hospital Stay (HOSPITAL_COMMUNITY)
Admission: EM | Admit: 2017-08-27 | Discharge: 2017-09-07 | DRG: 291 | Disposition: A | Discharge status: Home Health | Payer: Medicare Other | Attending: Cardiology | Admitting: Cardiology

## 2017-08-27 DIAGNOSIS — N179 Acute kidney failure, unspecified: Secondary | ICD-10-CM | POA: Diagnosis present

## 2017-08-27 DIAGNOSIS — I130 Hypertensive heart and chronic kidney disease with heart failure and stage 1 through stage 4 chronic kidney disease, or unspecified chronic kidney disease: Principal | ICD-10-CM | POA: Diagnosis present

## 2017-08-27 DIAGNOSIS — I70219 Atherosclerosis of native arteries of extremities with intermittent claudication, unspecified extremity: Secondary | ICD-10-CM | POA: Diagnosis present

## 2017-08-27 DIAGNOSIS — I248 Other forms of acute ischemic heart disease: Secondary | ICD-10-CM | POA: Diagnosis present

## 2017-08-27 DIAGNOSIS — E785 Hyperlipidemia, unspecified: Secondary | ICD-10-CM | POA: Diagnosis present

## 2017-08-27 DIAGNOSIS — N261 Atrophy of kidney (terminal): Secondary | ICD-10-CM

## 2017-08-27 DIAGNOSIS — I361 Nonrheumatic tricuspid (valve) insufficiency: Secondary | ICD-10-CM | POA: Diagnosis not present

## 2017-08-27 DIAGNOSIS — Z515 Encounter for palliative care: Secondary | ICD-10-CM | POA: Diagnosis not present

## 2017-08-27 DIAGNOSIS — I9581 Postprocedural hypotension: Secondary | ICD-10-CM | POA: Diagnosis not present

## 2017-08-27 DIAGNOSIS — I2510 Atherosclerotic heart disease of native coronary artery without angina pectoris: Secondary | ICD-10-CM | POA: Diagnosis present

## 2017-08-27 DIAGNOSIS — J309 Allergic rhinitis, unspecified: Secondary | ICD-10-CM | POA: Diagnosis present

## 2017-08-27 DIAGNOSIS — J810 Acute pulmonary edema: Secondary | ICD-10-CM | POA: Diagnosis not present

## 2017-08-27 DIAGNOSIS — J9621 Acute and chronic respiratory failure with hypoxia: Secondary | ICD-10-CM | POA: Diagnosis present

## 2017-08-27 DIAGNOSIS — Z886 Allergy status to analgesic agent status: Secondary | ICD-10-CM

## 2017-08-27 DIAGNOSIS — J189 Pneumonia, unspecified organism: Secondary | ICD-10-CM

## 2017-08-27 DIAGNOSIS — I471 Supraventricular tachycardia: Secondary | ICD-10-CM | POA: Diagnosis present

## 2017-08-27 DIAGNOSIS — Z681 Body mass index (BMI) 19 or less, adult: Secondary | ICD-10-CM

## 2017-08-27 DIAGNOSIS — K7200 Acute and subacute hepatic failure without coma: Secondary | ICD-10-CM | POA: Diagnosis not present

## 2017-08-27 DIAGNOSIS — R0602 Shortness of breath: Secondary | ICD-10-CM | POA: Diagnosis not present

## 2017-08-27 DIAGNOSIS — I10 Essential (primary) hypertension: Secondary | ICD-10-CM | POA: Diagnosis present

## 2017-08-27 DIAGNOSIS — I340 Nonrheumatic mitral (valve) insufficiency: Secondary | ICD-10-CM | POA: Diagnosis not present

## 2017-08-27 DIAGNOSIS — Z823 Family history of stroke: Secondary | ICD-10-CM | POA: Diagnosis not present

## 2017-08-27 DIAGNOSIS — N184 Chronic kidney disease, stage 4 (severe): Secondary | ICD-10-CM | POA: Diagnosis present

## 2017-08-27 DIAGNOSIS — M1A9XX0 Chronic gout, unspecified, without tophus (tophi): Secondary | ICD-10-CM | POA: Diagnosis present

## 2017-08-27 DIAGNOSIS — I5041 Acute combined systolic (congestive) and diastolic (congestive) heart failure: Secondary | ICD-10-CM | POA: Diagnosis not present

## 2017-08-27 DIAGNOSIS — E872 Acidosis: Secondary | ICD-10-CM | POA: Diagnosis not present

## 2017-08-27 DIAGNOSIS — Z66 Do not resuscitate: Secondary | ICD-10-CM | POA: Diagnosis not present

## 2017-08-27 DIAGNOSIS — E7800 Pure hypercholesterolemia, unspecified: Secondary | ICD-10-CM | POA: Diagnosis present

## 2017-08-27 DIAGNOSIS — R570 Cardiogenic shock: Secondary | ICD-10-CM | POA: Diagnosis not present

## 2017-08-27 DIAGNOSIS — I420 Dilated cardiomyopathy: Secondary | ICD-10-CM | POA: Diagnosis present

## 2017-08-27 DIAGNOSIS — Z825 Family history of asthma and other chronic lower respiratory diseases: Secondary | ICD-10-CM | POA: Diagnosis not present

## 2017-08-27 DIAGNOSIS — Z8249 Family history of ischemic heart disease and other diseases of the circulatory system: Secondary | ICD-10-CM

## 2017-08-27 DIAGNOSIS — N170 Acute kidney failure with tubular necrosis: Secondary | ICD-10-CM | POA: Diagnosis not present

## 2017-08-27 DIAGNOSIS — D72829 Elevated white blood cell count, unspecified: Secondary | ICD-10-CM

## 2017-08-27 DIAGNOSIS — E875 Hyperkalemia: Secondary | ICD-10-CM | POA: Diagnosis present

## 2017-08-27 DIAGNOSIS — R748 Abnormal levels of other serum enzymes: Secondary | ICD-10-CM | POA: Diagnosis not present

## 2017-08-27 DIAGNOSIS — I700 Atherosclerosis of aorta: Secondary | ICD-10-CM | POA: Diagnosis present

## 2017-08-27 DIAGNOSIS — J811 Chronic pulmonary edema: Secondary | ICD-10-CM

## 2017-08-27 DIAGNOSIS — N183 Chronic kidney disease, stage 3 unspecified: Secondary | ICD-10-CM | POA: Diagnosis present

## 2017-08-27 DIAGNOSIS — I4892 Unspecified atrial flutter: Secondary | ICD-10-CM | POA: Clinically undetermined

## 2017-08-27 DIAGNOSIS — I252 Old myocardial infarction: Secondary | ICD-10-CM

## 2017-08-27 DIAGNOSIS — I5042 Chronic combined systolic (congestive) and diastolic (congestive) heart failure: Secondary | ICD-10-CM | POA: Diagnosis present

## 2017-08-27 DIAGNOSIS — R778 Other specified abnormalities of plasma proteins: Secondary | ICD-10-CM

## 2017-08-27 DIAGNOSIS — Z951 Presence of aortocoronary bypass graft: Secondary | ICD-10-CM | POA: Diagnosis not present

## 2017-08-27 DIAGNOSIS — Z7982 Long term (current) use of aspirin: Secondary | ICD-10-CM | POA: Diagnosis not present

## 2017-08-27 DIAGNOSIS — I5043 Acute on chronic combined systolic (congestive) and diastolic (congestive) heart failure: Secondary | ICD-10-CM | POA: Diagnosis present

## 2017-08-27 DIAGNOSIS — F17290 Nicotine dependence, other tobacco product, uncomplicated: Secondary | ICD-10-CM | POA: Diagnosis present

## 2017-08-27 DIAGNOSIS — I959 Hypotension, unspecified: Secondary | ICD-10-CM

## 2017-08-27 DIAGNOSIS — R64 Cachexia: Secondary | ICD-10-CM | POA: Diagnosis present

## 2017-08-27 DIAGNOSIS — I4891 Unspecified atrial fibrillation: Secondary | ICD-10-CM | POA: Diagnosis present

## 2017-08-27 DIAGNOSIS — R000 Tachycardia, unspecified: Secondary | ICD-10-CM | POA: Diagnosis not present

## 2017-08-27 DIAGNOSIS — I081 Rheumatic disorders of both mitral and tricuspid valves: Secondary | ICD-10-CM | POA: Diagnosis present

## 2017-08-27 DIAGNOSIS — J441 Chronic obstructive pulmonary disease with (acute) exacerbation: Secondary | ICD-10-CM | POA: Diagnosis present

## 2017-08-27 DIAGNOSIS — R911 Solitary pulmonary nodule: Secondary | ICD-10-CM | POA: Diagnosis present

## 2017-08-27 DIAGNOSIS — Z720 Tobacco use: Secondary | ICD-10-CM | POA: Diagnosis not present

## 2017-08-27 DIAGNOSIS — E876 Hypokalemia: Secondary | ICD-10-CM | POA: Diagnosis not present

## 2017-08-27 DIAGNOSIS — R002 Palpitations: Secondary | ICD-10-CM | POA: Diagnosis present

## 2017-08-27 DIAGNOSIS — I484 Atypical atrial flutter: Secondary | ICD-10-CM | POA: Diagnosis present

## 2017-08-27 DIAGNOSIS — Z452 Encounter for adjustment and management of vascular access device: Secondary | ICD-10-CM

## 2017-08-27 DIAGNOSIS — R7989 Other specified abnormal findings of blood chemistry: Secondary | ICD-10-CM | POA: Diagnosis present

## 2017-08-27 DIAGNOSIS — E874 Mixed disorder of acid-base balance: Secondary | ICD-10-CM | POA: Diagnosis not present

## 2017-08-27 DIAGNOSIS — J439 Emphysema, unspecified: Secondary | ICD-10-CM | POA: Diagnosis present

## 2017-08-27 LAB — I-STAT TROPONIN, ED: Troponin i, poc: 0.09 ng/mL (ref 0.00–0.08)

## 2017-08-27 LAB — CBC
HCT: 39.1 % (ref 39.0–52.0)
Hemoglobin: 12.5 g/dL — ABNORMAL LOW (ref 13.0–17.0)
MCH: 31.3 pg (ref 26.0–34.0)
MCHC: 32 g/dL (ref 30.0–36.0)
MCV: 98 fL (ref 78.0–100.0)
Platelets: 366 K/uL (ref 150–400)
RBC: 3.99 MIL/uL — ABNORMAL LOW (ref 4.22–5.81)
RDW: 15.6 % — ABNORMAL HIGH (ref 11.5–15.5)
WBC: 10.6 K/uL — ABNORMAL HIGH (ref 4.0–10.5)

## 2017-08-27 LAB — BASIC METABOLIC PANEL
Anion gap: 11 (ref 5–15)
BUN: 40 mg/dL — AB (ref 6–20)
CALCIUM: 9.8 mg/dL (ref 8.9–10.3)
CO2: 21 mmol/L — ABNORMAL LOW (ref 22–32)
CREATININE: 2.36 mg/dL — AB (ref 0.61–1.24)
Chloride: 106 mmol/L (ref 101–111)
GFR calc Af Amer: 30 mL/min — ABNORMAL LOW (ref >60)
GFR, EST NON AFRICAN AMERICAN: 26 mL/min — AB (ref >60)
Glucose, Bld: 108 mg/dL — ABNORMAL HIGH (ref 65–99)
POTASSIUM: 4.9 mmol/L (ref 3.5–5.1)
SODIUM: 138 mmol/L (ref 135–145)

## 2017-08-27 MED ORDER — IPRATROPIUM BROMIDE 0.02 % IN SOLN
0.5000 mg | RESPIRATORY_TRACT | Status: DC
  Administered 2017-08-28: 0.5 mg via RESPIRATORY_TRACT
  Filled 2017-08-27: qty 2.5

## 2017-08-27 MED ORDER — ATORVASTATIN CALCIUM 40 MG PO TABS
40.0000 mg | ORAL_TABLET | Freq: Every day | ORAL | Status: DC
  Administered 2017-08-28 – 2017-08-31 (×4): 40 mg via ORAL
  Filled 2017-08-27 (×4): qty 1

## 2017-08-27 MED ORDER — ACETAMINOPHEN 325 MG PO TABS: 650.0000 mg | ORAL_TABLET | Freq: Four times a day (QID) | ORAL | Status: DC | PRN

## 2017-08-27 MED ORDER — MORPHINE SULFATE (PF) 4 MG/ML IV SOLN: 1.0000 mg | INTRAVENOUS | Status: DC | PRN

## 2017-08-27 MED ORDER — ONDANSETRON HCL 4 MG/2ML IJ SOLN
4.0000 mg | Freq: Three times a day (TID) | INTRAMUSCULAR | Status: DC | PRN
Start: 2017-08-27 — End: 2017-09-07

## 2017-08-27 MED ORDER — METOPROLOL TARTRATE 25 MG PO TABS
25.0000 mg | ORAL_TABLET | Freq: Two times a day (BID) | ORAL | Status: DC
  Administered 2017-08-28 (×2): 25 mg via ORAL
  Filled 2017-08-27 (×2): qty 1

## 2017-08-27 MED ORDER — METOPROLOL TARTRATE 5 MG/5ML IV SOLN
5.0000 mg | Freq: Once | INTRAVENOUS | Status: AC
  Administered 2017-08-27: 5 mg via INTRAVENOUS
  Filled 2017-08-27: qty 5

## 2017-08-27 MED ORDER — METOPROLOL TARTRATE 25 MG PO TABS
25.0000 mg | ORAL_TABLET | Freq: Once | ORAL | Status: AC
  Administered 2017-08-27: 25 mg via ORAL
  Filled 2017-08-27: qty 1

## 2017-08-27 MED ORDER — DM-GUAIFENESIN ER 30-600 MG PO TB12
1.0000 | ORAL_TABLET | Freq: Two times a day (BID) | ORAL | Status: DC | PRN
  Filled 2017-08-27: qty 1

## 2017-08-27 MED ORDER — COLCHICINE 0.6 MG PO TABS: 0.6000 mg | ORAL_TABLET | Freq: Every day | ORAL | Status: DC | PRN

## 2017-08-27 MED ORDER — NITROGLYCERIN 0.4 MG SL SUBL: 0.4000 mg | SUBLINGUAL_TABLET | SUBLINGUAL | Status: DC | PRN

## 2017-08-27 MED ORDER — METHYLPREDNISOLONE SODIUM SUCC 125 MG IJ SOLR
60.0000 mg | Freq: Two times a day (BID) | INTRAMUSCULAR | Status: DC
  Administered 2017-08-28 (×2): 60 mg via INTRAVENOUS
  Filled 2017-08-27 (×2): qty 2

## 2017-08-27 MED ORDER — ASPIRIN 81 MG PO CHEW
324.0000 mg | CHEWABLE_TABLET | Freq: Every day | ORAL | Status: DC
  Administered 2017-08-28 – 2017-08-29 (×2): 324 mg via ORAL
  Filled 2017-08-27 (×2): qty 4

## 2017-08-27 MED ORDER — AZITHROMYCIN 250 MG PO TABS
250.0000 mg | ORAL_TABLET | Freq: Every day | ORAL | Status: DC
  Administered 2017-08-28 – 2017-08-30 (×3): 250 mg via ORAL
  Filled 2017-08-27 (×3): qty 1

## 2017-08-27 MED ORDER — FUROSEMIDE 10 MG/ML IJ SOLN
40.0000 mg | Freq: Once | INTRAMUSCULAR | Status: AC
  Administered 2017-08-27: 40 mg via INTRAVENOUS
  Filled 2017-08-27: qty 4

## 2017-08-27 MED ORDER — METOPROLOL TARTRATE 5 MG/5ML IV SOLN
5.0000 mg | INTRAVENOUS | Status: DC | PRN
  Administered 2017-08-28 – 2017-08-30 (×4): 5 mg via INTRAVENOUS
  Filled 2017-08-27 (×4): qty 5

## 2017-08-27 MED ORDER — NICOTINE 21 MG/24HR TD PT24
21.0000 mg | MEDICATED_PATCH | Freq: Every day | TRANSDERMAL | Status: DC
  Administered 2017-08-31 – 2017-09-07 (×8): 21 mg via TRANSDERMAL
  Filled 2017-08-27 (×10): qty 1

## 2017-08-27 MED ORDER — SODIUM CHLORIDE 0.9 % IV SOLN
1.0000 g | Freq: Once | INTRAVENOUS | Status: AC
  Administered 2017-08-27: 1 g via INTRAVENOUS
  Filled 2017-08-27: qty 10

## 2017-08-27 MED ORDER — METOPROLOL TARTRATE 5 MG/5ML IV SOLN: 5.0000 mg | Freq: Once | INTRAVENOUS | Status: DC

## 2017-08-27 MED ORDER — AZITHROMYCIN 250 MG PO TABS
500.0000 mg | ORAL_TABLET | Freq: Once | ORAL | Status: AC
  Administered 2017-08-27: 500 mg via ORAL
  Filled 2017-08-27: qty 2

## 2017-08-27 MED ORDER — HEPARIN SODIUM (PORCINE) 5000 UNIT/ML IJ SOLN
5000.0000 [IU] | Freq: Three times a day (TID) | INTRAMUSCULAR | Status: DC
  Administered 2017-08-28 (×2): 5000 [IU] via SUBCUTANEOUS
  Filled 2017-08-27 (×2): qty 1

## 2017-08-27 MED ORDER — HYDRALAZINE HCL 20 MG/ML IJ SOLN: 5.0000 mg | INTRAMUSCULAR | Status: DC | PRN

## 2017-08-27 MED ORDER — LEVALBUTEROL HCL 1.25 MG/0.5ML IN NEBU
1.2500 mg | INHALATION_SOLUTION | Freq: Four times a day (QID) | RESPIRATORY_TRACT | Status: DC
  Administered 2017-08-28: 1.25 mg via RESPIRATORY_TRACT
  Filled 2017-08-27 (×3): qty 0.5

## 2017-08-27 MED ORDER — ASPIRIN 81 MG PO CHEW
324.0000 mg | CHEWABLE_TABLET | Freq: Once | ORAL | Status: AC
  Administered 2017-08-27: 324 mg via ORAL
  Filled 2017-08-27: qty 4

## 2017-08-27 MED ORDER — ZOLPIDEM TARTRATE 5 MG PO TABS: 5.0000 mg | ORAL_TABLET | Freq: Every evening | ORAL | Status: DC | PRN

## 2017-08-27 NOTE — ED Notes (Signed)
ED Provider at bedside. 

## 2017-08-27 NOTE — ED Provider Notes (Addendum)
[facility] EMERGENCY DEPARTMENT Provider Note   CSN: [id number] Arrival date & time: 08/27/17  1545     History   Chief Complaint Chief Complaint  Patient presents with  . Palpitations  . Shortness of Breath    HPI Derrick Marsh is a 72 y.o. male who presents with shortness of breath.  Past medical history significant for coronary artery disease status post CABG, hypertension, hyperlipidemia, tobacco abuse, COPD.  The patient states that he has had intermittent shortness of breath which started in the spring time.  He states that it was worse when he would mow his lawn.  He will go about 5 steps and get SOB. He is not SOB when lying flat. He has taken several over-the-counter medicines (Mucinex-D, Bronchaid) and every time he would take it it felt like he would have pounding in his chest.  Over the past week he has had gradually worsening shortness of breath.  He also reports a dry cough and mild wheezing occasionally.  He called his cardiologist office who told him to call his PCP.  He went to his PCP today and he was told to come to the emergency room because he had an abnormal EKG.  He states he is possibly had fever and chills.  He denies lightheadedness, syncope, chest pain, leg swelling.  Cardiologist: Dr. Mayford Knife  HPI  Past Medical History:  Diagnosis Date  . Allergic rhinitis   . Atherosclerosis of native arteries of the extremities with intermittent claudication   . 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 HTN (hypertension)   . Hypercholesteremia   . PVC's (premature ventricular contractions)   . Tobacco abuse     Patient Active Problem List   Diagnosis Date Noted  . PVC's (premature ventricular contractions)   . CAD (coronary artery disease)   . Hypercholesteremia   . HTN (hypertension)   . Tobacco abuse     Past Surgical History:  Procedure Laterality Date  .  CORONARY ARTERY BYPASS GRAFT  2000        Home Medications    Prior to Admission medications   Medication Sig Start Date End Date Taking? Authorizing Provider  aspirin EC 81 MG tablet Take 81 mg by mouth daily.    [provider]  atorvastatin (LIPITOR) 40 MG tablet TAKE 1 TABLET EVERY DAY 01/11/17   Quintella Reichert, MD  colchicine 0.6 MG tablet Take 0.6 mg by mouth daily.    [provider]  metoprolol tartrate (LOPRESSOR) 25 MG tablet TAKE 1/2 TABLET BY MOUTH TWICE DAILY 12/28/16   Quintella Reichert, MD    Family History Family History  Problem Relation Age of Onset  . Heart disease Father   . Emphysema Father   . CVA Father   . Heart failure Mother     Social History Social History   Tobacco Use  . Smoking status: Current Some Day Smoker    Packs/day: 0.50    Types: Cigars  . Smokeless tobacco: Never Used  Substance Use Topics  . Alcohol use: No  . Drug use: No     Allergies   Patient has no known allergies.   Review of Systems Review of Systems  Constitutional: Positive for appetite change, chills and fever.  Respiratory: Positive for cough, shortness of breath and wheezing. Negative for chest tightness.   Cardiovascular: Positive for  palpitations. Negative for chest pain and leg swelling.  Gastrointestinal: Negative for abdominal pain, nausea and vomiting.  Neurological: Negative for syncope and light-headedness.  All other systems reviewed and are negative.    Physical Exam Updated Vital Signs BP (!) 139/92 (BP Location: Right Arm)   Pulse (!) 125   Temp 97.9 F (36.6 C) (Oral)   Resp 20   SpO2 92%   Physical Exam  Constitutional: He is oriented to person, place, and time. He appears well-developed and well-nourished. No distress.  Thin, calm, cooperative. Mildly tachypnea noted with talking  HENT:  Head: Normocephalic and atraumatic.  Eyes: Pupils are equal, round, and reactive to light. Conjunctivae are normal. Right eye  exhibits no discharge. Left eye exhibits no discharge. No scleral icterus.  Neck: Normal range of motion.  Cardiovascular: An irregularly irregular rhythm present. Tachycardia present.  Pulmonary/Chest: Effort normal and breath sounds normal. No respiratory distress.  Abdominal: Soft. Bowel sounds are normal. He exhibits no distension. There is no tenderness.  Musculoskeletal:  No peripheral edema  Neurological: He is alert and oriented to person, place, and time.  Skin: Skin is warm and dry.  Psychiatric: He has a normal mood and affect. His behavior is normal.  Nursing note and vitals reviewed.    ED Treatments / Results  Labs (all labs ordered are listed, but only abnormal results are displayed) Labs Reviewed  CBC - Abnormal; Notable for the following components:      Result Value   WBC 10.6 (*)    RBC 3.99 (*)    Hemoglobin 12.5 (*)    RDW 15.6 (*)    All other components within normal limits  I-STAT TROPONIN, ED - Abnormal; Notable for the following components:   Troponin i, poc 0.09 (*)    All other components within normal limits  BASIC METABOLIC PANEL  BRAIN NATRIURETIC PEPTIDE    EKG EKG Interpretation  Date/Time:  Friday August 27 2017 19:29:58 EDT Ventricular Rate:  116 PR Interval:    QRS Duration: 116 QT Interval:  335 QTC Calculation: 466 R Axis:   -109 Text Interpretation:  Sinus or ectopic atrial tachycardia Ventricular premature complex LAD, consider left anterior fascicular block Borderline T abnormalities, inferior leads When compared with ECG of EARLIER SAME DATE No significant change was found Confirmed by Dione Booze ([id number]) on 08/27/2017 11:20:41 PM   Radiology Dg Chest 2 View  Result Date: 08/27/2017 CLINICAL DATA:  Palpitations, shortness of breath with minimal exertion, chest tightness and intermittent dizziness for few days, history COPD EXAM: CHEST - 2 VIEW COMPARISON:  07/04/2009 FINDINGS: Enlargement of cardiac silhouette post CABG with mild  pulmonary vascular congestion. Atherosclerotic calcification aorta. Patchy BILATERAL pulmonary infiltrates could represent pulmonary edema or multifocal pneumonia. More focal opacity in the RIGHT upper lobe, likely related to infiltrate though underlying nodule not excluded. No pleural effusion or pneumothorax. Bones demineralized. IMPRESSION: Enlargement of cardiac silhouette with pulmonary vascular congestion post CABG. Patchy BILATERAL pulmonary infiltrates question pulmonary edema versus multifocal pneumonia. Follow-up exams until resolution recommended to exclude underlying pulmonary nodule in the RIGHT upper lobe. Electronically Signed   By: Ulyses Southward M.D.   On: 08/27/2017 17:02   Ct Chest Wo Contrast  Result Date: 08/27/2017 CLINICAL DATA:  Shortness of breath for 2 months. Worsening over the last 2 days. Tobacco abuse. EXAM: CT CHEST WITHOUT CONTRAST TECHNIQUE: Multidetector CT imaging of the chest was performed following the standard protocol without IV contrast. COMPARISON:  Plain films of earlier today.  No prior CT. FINDINGS: Cardiovascular: Aortic and branch vessel atherosclerosis. Moderate cardiomegaly, without pericardial effusion. Median sternotomy for CABG. Pulmonary artery enlargement, outflow tract 3.1 cm. Mediastinum/Nodes: Right paratracheal node of 1.3 cm. Subcarinal node measures 1.6 cm. Suspect bilateral hilar adenopathy. Lungs/Pleura: Small bilateral pleural effusions. Advanced bullous type emphysema. Upper lung and peripheral predominant areas of patchy airspace and ground-glass opacity. Suspect some areas of mild bronchiectasis related to architectural distortion. These are basilar predominant. Pulmonary nodules, including a 5 mm left upper lobe nodule on image 29/4 and a 7 mm right lower lobe pulmonary nodule on image 99/4. Upper Abdomen: Normal imaged portions of the liver, spleen, stomach, pancreas, adrenal glands, right kidney. Marked left renal atrophy. Abdominal aortic  atherosclerosis. Musculoskeletal: Moderate thoracic spondylosis. Mild to moderate T11 compression deformity. IMPRESSION: 1. Airspace and ground-glass opacity which is bilateral and peripheral predominant. Concurrent bilateral pleural effusions. Favor pulmonary edema and superimposed infection. Inflammatory etiologies such as organizing pneumonia felt less likely. Consider antibiotic therapy and diuresis with subsequent plain film radiographic follow-up. 2.  Emphysema (ICD10-J43.9). 3.  Aortic Atherosclerosis (ICD10-I70.0). 4. Pulmonary artery enlargement suggests pulmonary arterial hypertension. 5. Bilateral pulmonary nodules of maximally 7 mm. Non-contrast chest CT at 6-12 months is recommended. If the nodule is stable at time of repeat CT, then future CT at 18-24 months (from today's scan) is considered optional for low-risk patients, but is recommended for high-risk patients. This recommendation follows the consensus statement: Guidelines for Management of Incidental Pulmonary Nodules Detected on CT Images: From the Fleischner Society 2017; Radiology 2017; 284:228-243. 6. Thoracic adenopathy, favored to be reactive. Electronically Signed   By: Jeronimo Greaves M.D.   On: 08/27/2017 19:36    Procedures Procedures (including critical care time)  CRITICAL CARE Performed by: Bethel Born   Total critical care time: 30 minutes  Critical care time was exclusive of separately billable procedures and treating other patients.  Critical care was necessary to treat or prevent imminent or life-threatening deterioration.  Critical care was time spent personally by me on the following activities: development of treatment plan with patient and/or surrogate as well as nursing, discussions with consultants, evaluation of patient's response to treatment, examination of patient, obtaining history from patient or surrogate, ordering and performing treatments and interventions, ordering and review of laboratory  studies, ordering and review of radiographic studies, pulse oximetry and re-evaluation of patient's condition.   Medications Ordered in ED Medications  aspirin chewable tablet 324 mg (has no administration in time range)  metoprolol tartrate (LOPRESSOR) injection 5 mg (5 mg Intravenous Given 08/27/17 1804)     Initial Impression / Assessment and Plan / ED Course  I have reviewed the triage vital signs and the nursing notes.  Pertinent labs & imaging results that were available during my care of the patient were reviewed by me and considered in my medical decision making (see chart for details).  72 year old male presents with worsening dyspnea on exertion and intermittent palpitations. Initial EKG shows A flutter with RVR with rates in the 120s. POC troponin is 0.09. He was given 324mg  ASA. Initial EKG shows possible A. Flutter. He was given an IV dose of Metoprolol and EKG was repeated which shows more of a ectopic atrial tachycardia. His CXR shows pulmonary edema vs pneumonia. CBC is remarkable for mild leukocytosis of 10.6 and mild anemia. BMP is remarkable for elevated SCr from baseline (2.36). Will obtain CT non-contrast of chest to further characterize.   8:15 PM Spoke with Cardiology who  will come to see. CT chest shows ground glass opacities as well as bilateral pleural effusions with pulmonary edema. Will give Lasix and antibiotics.  9:14 PM Spoke with Dr. Clyde LundborgNiu who will admit.    Final Clinical Impressions(s) / ED Diagnoses   Final diagnoses:  Community acquired pneumonia, unspecified laterality  Acute pulmonary edema (HCC)  AKI (acute kidney injury) (HCC)  Elevated troponin    ED Discharge Orders    None       Bethel BornGekas, Jasman Murri Marie, PA-C 08/28/17 0026    Bethel BornGekas, Deshunda Thackston Marie, PA-C 09/16/17 1633    Vanetta MuldersZackowski, Scott, MD 09/22/17 [id number]

## 2017-08-27 NOTE — ED Notes (Signed)
Trop [facility] 0.09 [facility]

## 2017-08-27 NOTE — H&P (Signed)
History and Physical    Derrick Marsh ZOX:[id number] DOB: 1945-06-06 DOA: 08/27/2017  Referring MD/NP/PA:   PCP: Renford Dills, MD   Patient coming from:  The patient is coming from home.  At baseline, pt is independent for most of ADL.   Chief Complaint: SOB  HPI: Derrick Marsh is a 72 y.o. male with medical history significant of hypertension, hyperlipidemia, COPD, gout, CAD, CABG, PAD, tobacco abuse, PVC, who presents with shortness of breath.  Patient states that he has been having shortness of breath for several weeks, which has worsened in the past several days.  It is aggravated by exertion.  Patient denies chest pain. He has mild dizziness an iIntermittent palpitation. He has dry cough, but no fever or chills. No leg edema. Pt was instructed by cardiologist to get EKG at his PCPs office.The ECG in the office documented atrial flutter/fibrillation and the patient was therefore advised to come to the ED for further evaluation.  Patient denies nausea, vomiting, diarrhea, abdominal pain, symptoms of UTI or unilateral weakness.  He does not have leg edema.  ED Course: pt was found to have troponin 0.09, BNP 3310, cre 2.36 which was 1.64 on 01/10/15, temperature normal, tachycardia, tachypnea, oxygen saturation 90% on room air.  Patient is admitted to telemetry bed as inpatient.  Cardiology, Dr. Deforest Hoyles was consulted.  # CXR showed: Enlargement of cardiac silhouette with pulmonary vascular congestion, post CABG; patchy BILATERAL pulmonary infiltrates question pulmonary edema versus multifocal pneumonia.  CT-chest showed: 1. Airspace and ground-glass opacity which is bilateral and peripheral predominant. Concurrent bilateral pleural effusions. Favor pulmonary edema and superimposed infection. Inflammatory etiologies such as organizing pneumonia felt less likely.\ 2.  Emphysema (ICD10-J43.9). 3.  Aortic Atherosclerosis (ICD10-I70.0). 4. Pulmonary artery enlargement suggests pulmonary  arterial hypertension. 5. Bilateral pulmonary nodules of maximally 7 mm. Non-contrast chest. CT at 6-12 months is recommended. 6. Thoracic adenopathy, favored to be reactive.  Review of Systems:   General: no fevers, chills, no body weight gain, has poor appetite, has fatigue HEENT: no blurry vision, hearing changes or sore throat Respiratory: has dyspnea, coughing, no wheezing CV: no chest pain, has palpitations GI: no nausea, vomiting, abdominal pain, diarrhea, constipation GU: no dysuria, burning on urination, increased urinary frequency, hematuria  Ext: no leg edema Neuro: no unilateral weakness, numbness, or tingling, no vision change or hearing loss Skin: no rash, no skin tear. Has skin tattoo MSK: No muscle spasm, no deformity, no limitation of range of movement in spin Heme: No easy bruising.  Travel history: No recent long distant travel.  Allergy:  Allergies  Allergen Reactions  . Aleve [Naproxen] Other (See Comments)    Was told by a MD to not take this; conflicted with his other meds    Past Medical History:  Diagnosis Date  . Allergic rhinitis   . Atherosclerosis of native arteries of the extremities with intermittent claudication   . 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 HTN (hypertension)   . Hypercholesteremia   . PVC's (premature ventricular contractions)   . Tobacco abuse     Past Surgical History:  Procedure Laterality Date  . CORONARY ARTERY BYPASS GRAFT  2000    Social History:  reports that he has been smoking cigars.  He has been smoking about 0.50 packs per day. He has never used smokeless tobacco. He reports that he does not drink  alcohol or use drugs.  Family History:  Family History  Problem Relation Age of Onset  . Heart disease Father   . Emphysema Father   . CVA Father   . Heart failure Mother      Prior to Admission medications   Medication Sig Start  Date End Date Taking? Authorizing Provider  aspirin EC 81 MG tablet Take 81 mg by mouth daily.    [provider]  atorvastatin (LIPITOR) 40 MG tablet TAKE 1 TABLET EVERY DAY 01/11/17   Quintella Reichert, MD  colchicine 0.6 MG tablet Take 0.6 mg by mouth daily.    [provider]  metoprolol tartrate (LOPRESSOR) 25 MG tablet TAKE 1/2 TABLET BY MOUTH TWICE DAILY 12/28/16   Quintella Reichert, MD    Physical Exam: Vitals:   08/27/17 2342 08/28/17 0026 08/28/17 0529 08/28/17 0600  BP: 100/74   106/71  Pulse: (!) 117   (!) 105  Resp: 18   20  Temp: (!) 97.4 F (36.3 C)   97.7 F (36.5 C)  TempSrc: Oral   Oral  SpO2: 92% 94%  94%  Weight:    48.3 kg (106 lb 7.7 oz)  Height:   5\' 2"  (1.575 m)    General: Not in acute distress HEENT:       Eyes: PERRL, EOMI, no scleral icterus.       ENT: No discharge from the ears and nose, no pharynx injection, no tonsillar enlargement.        Neck: No JVD, no bruit, no mass felt. Heme: No neck lymph node enlargement. Cardiac: S1/S2, RRR, No murmurs, No gallops or rubs. Respiratory: has fine crackles and rhonchi bilaterally GI: Soft, nondistended, nontender, no rebound pain, no organomegaly, BS present. GU: No hematuria Ext: No pitting leg edema bilaterally. 2+DP/PT pulse bilaterally. Musculoskeletal: No joint deformities, No joint redness or warmth, no limitation of ROM in spin. Skin: No rashes. Has skin tattoo Neuro: Alert, oriented X3, cranial nerves II-XII grossly intact, moves all extremities normally.  Psych: Patient is not psychotic, no suicidal or hemocidal ideation.  Labs on Admission: I have personally reviewed following labs and imaging studies  CBC: Recent Labs  Lab 08/27/17 1555  WBC 10.6*  HGB 12.5*  HCT 39.1  MCV 98.0  PLT 366   Basic Metabolic Panel: Recent Labs  Lab 08/27/17 1555  NA 138  K 4.9  CL 106  CO2 21*  GLUCOSE 108*  BUN 40*  CREATININE 2.36*  CALCIUM 9.8   GFR: Estimated Creatinine  Clearance: 19.3 mL/min (A) (by C-G formula based on SCr of 2.36 mg/dL (H)). Liver Function Tests: No results for input(s): AST, ALT, ALKPHOS, BILITOT, PROT, ALBUMIN in the last 168 hours. No results for input(s): LIPASE, AMYLASE in the last 168 hours. No results for input(s): AMMONIA in the last 168 hours. Coagulation Profile: No results for input(s): INR, PROTIME in the last 168 hours. Cardiac Enzymes: Recent Labs  Lab 08/27/17 2239  TROPONINI 0.06*   BNP (last 3 results) No results for input(s): PROBNP in the last 8760 hours. HbA1C: Recent Labs    08/28/17 0520  HGBA1C 6.4*   CBG: No results for input(s): GLUCAP in the last 168 hours. Lipid Profile: No results for input(s): CHOL, HDL, LDLCALC, TRIG, CHOLHDL, LDLDIRECT in the last 72 hours. Thyroid Function Tests: No results for input(s): TSH, T4TOTAL, FREET4, T3FREE, THYROIDAB in the last 72 hours. Anemia Panel: No results for input(s): VITAMINB12, FOLATE, FERRITIN, TIBC, IRON, RETICCTPCT in the  last 72 hours. Urine analysis: No results found for: COLORURINE, APPEARANCEUR, LABSPEC, PHURINE, GLUCOSEU, HGBUR, BILIRUBINUR, KETONESUR, PROTEINUR, UROBILINOGEN, NITRITE, LEUKOCYTESUR Sepsis Labs: @LABRCNTIP (procalcitonin:4,lacticidven:4) ) Recent Results (from the past 240 hour(s))  Respiratory Panel by PCR     Status: None   Collection Time: 08/27/17 10:50 PM  Result Value Ref Range Status   Adenovirus NOT DETECTED NOT DETECTED Final   Coronavirus 229E NOT DETECTED NOT DETECTED Final   Coronavirus HKU1 NOT DETECTED NOT DETECTED Final   Coronavirus NL63 NOT DETECTED NOT DETECTED Final   Coronavirus OC43 NOT DETECTED NOT DETECTED Final   Metapneumovirus NOT DETECTED NOT DETECTED Final   Rhinovirus / Enterovirus NOT DETECTED NOT DETECTED Final   Influenza A NOT DETECTED NOT DETECTED Final   Influenza B NOT DETECTED NOT DETECTED Final   Parainfluenza Virus 1 NOT DETECTED NOT DETECTED Final   Parainfluenza Virus 2 NOT DETECTED  NOT DETECTED Final   Parainfluenza Virus 3 NOT DETECTED NOT DETECTED Final   Parainfluenza Virus 4 NOT DETECTED NOT DETECTED Final   Respiratory Syncytial Virus NOT DETECTED NOT DETECTED Final   Bordetella pertussis NOT DETECTED NOT DETECTED Final   Chlamydophila pneumoniae NOT DETECTED NOT DETECTED Final   Mycoplasma pneumoniae NOT DETECTED NOT DETECTED Final     Radiological Exams on Admission: Dg Chest 2 View  Result Date: 08/27/2017 CLINICAL DATA:  Palpitations, shortness of breath with minimal exertion, chest tightness and intermittent dizziness for few days, history COPD EXAM: CHEST - 2 VIEW COMPARISON:  07/04/2009 FINDINGS: Enlargement of cardiac silhouette post CABG with mild pulmonary vascular congestion. Atherosclerotic calcification aorta. Patchy BILATERAL pulmonary infiltrates could represent pulmonary edema or multifocal pneumonia. More focal opacity in the RIGHT upper lobe, likely related to infiltrate though underlying nodule not excluded. No pleural effusion or pneumothorax. Bones demineralized. IMPRESSION: Enlargement of cardiac silhouette with pulmonary vascular congestion post CABG. Patchy BILATERAL pulmonary infiltrates question pulmonary edema versus multifocal pneumonia. Follow-up exams until resolution recommended to exclude underlying pulmonary nodule in the RIGHT upper lobe. Electronically Signed   By: Ulyses SouthwardMark  Boles M.D.   On: 08/27/2017 17:02   Ct Chest Wo Contrast  Result Date: 08/27/2017 CLINICAL DATA:  Shortness of breath for 2 months. Worsening over the last 2 days. Tobacco abuse. EXAM: CT CHEST WITHOUT CONTRAST TECHNIQUE: Multidetector CT imaging of the chest was performed following the standard protocol without IV contrast. COMPARISON:  Plain films of earlier today.  No prior CT. FINDINGS: Cardiovascular: Aortic and branch vessel atherosclerosis. Moderate cardiomegaly, without pericardial effusion. Median sternotomy for CABG. Pulmonary artery enlargement, outflow tract  3.1 cm. Mediastinum/Nodes: Right paratracheal node of 1.3 cm. Subcarinal node measures 1.6 cm. Suspect bilateral hilar adenopathy. Lungs/Pleura: Small bilateral pleural effusions. Advanced bullous type emphysema. Upper lung and peripheral predominant areas of patchy airspace and ground-glass opacity. Suspect some areas of mild bronchiectasis related to architectural distortion. These are basilar predominant. Pulmonary nodules, including a 5 mm left upper lobe nodule on image 29/4 and a 7 mm right lower lobe pulmonary nodule on image 99/4. Upper Abdomen: Normal imaged portions of the liver, spleen, stomach, pancreas, adrenal glands, right kidney. Marked left renal atrophy. Abdominal aortic atherosclerosis. Musculoskeletal: Moderate thoracic spondylosis. Mild to moderate T11 compression deformity. IMPRESSION: 1. Airspace and ground-glass opacity which is bilateral and peripheral predominant. Concurrent bilateral pleural effusions. Favor pulmonary edema and superimposed infection. Inflammatory etiologies such as organizing pneumonia felt less likely. Consider antibiotic therapy and diuresis with subsequent plain film radiographic follow-up. 2.  Emphysema (ICD10-J43.9). 3.  Aortic Atherosclerosis (ICD10-I70.0).  4. Pulmonary artery enlargement suggests pulmonary arterial hypertension. 5. Bilateral pulmonary nodules of maximally 7 mm. Non-contrast chest CT at 6-12 months is recommended. If the nodule is stable at time of repeat CT, then future CT at 18-24 months (from today's scan) is considered optional for low-risk patients, but is recommended for high-risk patients. This recommendation follows the consensus statement: Guidelines for Management of Incidental Pulmonary Nodules Detected on CT Images: From the Fleischner Society 2017; Radiology 2017; 284:228-243. 6. Thoracic adenopathy, favored to be reactive. Electronically Signed   By: Jeronimo Greaves M.D.   On: 08/27/2017 19:36     EKG: Independently reviewed.  QTC  460, tachycardia, narrow complex, LAD, poor R wave progression, T wave inversion in V6.   Assessment/Plan Principal Problem:   Acute on chronic respiratory failure with hypoxia (HCC) Active Problems:   CAD (coronary artery disease)   Hypercholesteremia   HTN (hypertension)   Tobacco abuse   Acute renal failure superimposed on stage 3 chronic kidney disease (HCC)   COPD with acute exacerbation (HCC)   Lung nodule   Elevated troponin   Acute on chronic respiratory failure with hypoxia (HCC): Probable atypical infection and COPD exacerbation.. CT chest showed groundglas opacity. His BNP is 3310, but he does not have any leg edema JVD, does not seem to have CHF, but will f/u  2d echo for next decision.  He received 1 dose of Lasix 40 mg in ED, will not continue due to worsening renal function.  -will admit to tele bed as inpt -Nebulizers: scheduled Atrovent and prn Xopenex Nebs -Solu-Medrol 60 mg IV bid -Z pak -Mucinex for cough  -Incentive spirometry -Urine S. pneumococcal antigen -Follow up blood culture x2, sputum culture, respiratory virus panel -Nasal cannula oxygen as needed to maintain O2 saturation 92% or greater  Elevated troponin and hx of CAD: s/p of CABG. Troponin 0 0.09.  Currently no chest pain.  Cardiology, Dr. Gilmore Laroche was consulted. - cycle CE q6 x3 and repeat EKG in the am  - prn Nitroglycerin, Morphine, and aspirin, lipitor  - Risk factor stratification: will check FLP and A1C  - 2d echo  Narrow complex tachycardia: HR is 120s to 130s. Per Dr. Deforest Hoyles, "differential diagnosis can include atypical atrial flutter versus atrial tachycardia versus atrial fibrillation". -will increase metoprolol dose from 12.5 to 25 mg twice daily -PRN metoprolol IV for HR>125  HLD: -lipitor  HTN:  -Continue home medications: Metoprolol -IV hydralazine prn  Tobacco abuse: -nicotine patch  AKI vs. CKD: pt had cre 1.64 on 01/10/15, not sure if this was one time point elevation or  CKD. His cre is up to 2.36 and BUN 40. -will hold off further diuretics - Follow up renal function by BMP - Check FeUrea  Lung nodule: CT showed Bilateral pulmonary nodules of maximally 7 mm -f/u with PCP   DVT ppx: SQ Heparin   Code Status: Full code Family Communication:    Yes, patient's friend at bed side Disposition Plan:  Anticipate discharge back to previous home environment Consults called:  Dr. Deforest Hoyles Admission status:   Inpatient/tele     Date of Service 08/28/2017    Lorretta Harp [facility] Pager [phone]  If 7PM-7AM, please contact night-coverage [email or web address] Password [facility] 08/28/2017, 6:54 AM

## 2017-08-27 NOTE — ED Notes (Addendum)
Lab results reported to Derrick Marsh at [facility].

## 2017-08-27 NOTE — ED Triage Notes (Signed)
Patient to ED c/o SOB, worse with minimal exertion, chest tightness, and intermittent dizziness for a few days - states the pollen gets bad and he got the wrong Mucinex, which makes his heart race. Patient has hx COPD but states it usually isn't a problem. He denies lightheadedness/dizziness at this time. Resp e/u, skin warm/dry.

## 2017-08-27 NOTE — Consult Note (Signed)
[facility] Consult Note   Primary Physician:  Renford Dills Primary Cardiologist:   Carolanne Grumbling  Reason for Consultation: Exertional shortness of breath and palpitations  HPI:    Derrick Marsh is a 72 year old male with a past medical history significant for CAD with CABG (in 2000), COPD, hypertension, hyperlipidemia, and tobacco abuse. He has been noticing a steady decrease in his exercise tolerance over the past 2 weeks. He states that he takes 15 steps and then has to stop to catch his breath. He has also noticed some off and on palpitations and dizziness. He also has a dry cough with mild wheezing. He had been taking over the counter decongestants (Mucinex-D and Bronchaid) for allergy symptoms. He stopped these in the recent past.  He does not report any orthopnea or PND. He does not have any leg swelling. There has been no recent weight gain. His appetite has been poor over the past few days.  Concerned about his symptoms the patient called the Cardiology office. He was told to get an ECG at his PCPs office. The ECG in the office documented atrial flutter/fibrillation and the patient was therefore advised to come to the ED for further evaluation.  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 He is s/p CABG in 2000 with LIMA to LAD, SVG to OM and SVG to RCA.     Review of Systems  Constitutional:       No recent weight gain  HENT: Negative.   Eyes: Negative.   Respiratory: Positive for cough.   Cardiovascular: Positive for palpitations. Negative for orthopnea and PND.       Shortness breath on exertion  Gastrointestinal: Negative for abdominal pain, nausea and vomiting.  Genitourinary: Negative for frequency.  Musculoskeletal: Negative for myalgias.  Skin: Negative.  Negative for rash.  Neurological: Positive for dizziness.  Endo/Heme/Allergies: Does not bruise/bleed easily.  Psychiatric/Behavioral: Negative for depression.        Home Medications Prior to Admission medications   Medication Sig Start Date End Date Taking? Authorizing Provider  aspirin EC 81 MG tablet Take 81 mg by mouth daily.    [provider]  atorvastatin (LIPITOR) 40 MG tablet TAKE 1 TABLET EVERY DAY 01/11/17   Quintella Reichert, MD  colchicine 0.6 MG tablet Take 0.6 mg by mouth daily.    [provider]  metoprolol tartrate (LOPRESSOR) 25 MG tablet TAKE 1/2 TABLET BY MOUTH TWICE DAILY 12/28/16   Quintella Reichert, MD    Past Medical History: Past Medical History:  Diagnosis Date  . Allergic rhinitis   . Atherosclerosis of native arteries of the extremities with intermittent claudication   . 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 HTN (hypertension)   . Hypercholesteremia   . PVC's (premature ventricular contractions)   . Tobacco abuse     Past Surgical History: Past Surgical History:  Procedure Laterality Date  . CORONARY ARTERY BYPASS GRAFT  2000    Family History: Family History  Problem Relation Age of Onset  . Heart disease Father   . Emphysema Father   . CVA Father   . Heart failure Mother     Social History: smokes 2-3 cigars per day   Social History   Socioeconomic History  . Marital status: Single    Spouse name: Not  on file  . Number of children: Not on file  . Years of education: Not on file  . Highest education level: Not on file  Occupational History  . Not on file  Social Needs  . Financial resource strain: Not on file  . Food insecurity:    Worry: Not on file    Inability: Not on file  . Transportation needs:    Medical: Not on file    Non-medical: Not on file  Tobacco Use  . Smoking status: Current Some Day Smoker    Packs/day: 0.50    Types: Cigars  . Smokeless tobacco: Never Used  Substance and Sexual Activity  . Alcohol use: No  . Drug use: No  . Sexual activity: Not on file  Lifestyle   . Physical activity:    Days per week: Not on file    Minutes per session: Not on file  . Stress: Not on file  Relationships  . Social connections:    Talks on phone: Not on file    Gets together: Not on file    Attends religious service: Not on file    Active member of club or organization: Not on file    Attends meetings of clubs or organizations: Not on file    Relationship status: Not on file  Other Topics Concern  . Not on file  Social History Narrative  . Not on file    Allergies:  No Known Allergies  Objective:    Vital Signs:   Temp:  [97.9 F (36.6 C)] 97.9 F (36.6 C) (06/14 1613) Pulse Rate:  [114-128] 114 (06/14 1948) Resp:  [20-27] 27 (06/14 1948) BP: (122-139)/(90-99) 132/95 (06/14 1948) SpO2:  [90 %-96 %] 96 % (06/14 1948)    Weight change: There were no vitals filed for this visit.  Intake/Output:  No intake or output data in the 24 hours ending 08/27/17 2022    Physical Exam    General:  Well appearing. No resp difficulty HEENT: normal Neck: supple. JVP . Carotids 2+ bilat; no bruits. No lymphadenopathy or thyromegaly appreciated. Cor: PMI nondisplaced. Tachycardiac, No rubs, gallops or murmurs. Lungs: Occasional rhonchi Abdomen: soft, nontender, nondistended. No hepatosplenomegaly. No bruits or masses. Good bowel sounds. Extremities: no cyanosis, clubbing, rash, edema Neuro: alert & orientedx3, cranial nerves grossly intact. moves all 4 extremities w/o difficulty. Affect pleasant    EKG    Ventricular rate of 116 bpm, atrial tachycardia, flat T waves in inferior leads, poor R progression in the precordial leads.  Labs   Basic Metabolic Panel: Recent Labs  Lab 08/27/17 1555  NA 138  K 4.9  CL 106  CO2 21*  GLUCOSE 108*  BUN 40*  CREATININE 2.36*  CALCIUM 9.8    Liver Function Tests: No results for input(s): AST, ALT, ALKPHOS, BILITOT, PROT, ALBUMIN in the last 168 hours. No results for input(s): LIPASE, AMYLASE in the last  168 hours. No results for input(s): AMMONIA in the last 168 hours.  CBC: Recent Labs  Lab 08/27/17 1555  WBC 10.6*  HGB 12.5*  HCT 39.1  MCV 98.0  PLT 366    Cardiac Enzymes: Troponin 0.09  BNP: BNP (last 3 results) No results for input(s): BNP in the last 8760 hours.  ProBNP (last 3 results) No results for input(s): PROBNP in the last 8760 hours.   CBG: No results for input(s): GLUCAP in the last 168 hours.  Coagulation Studies: No results for input(s): LABPROT, INR in the last 72 hours.  Imaging   Chest XRAY 2 View Result Date: 08/27/2017 FINDINGS: Enlargement of cardiac silhouette post CABG with mild pulmonary vascular congestion. Atherosclerotic calcification aorta. Patchy BILATERAL pulmonary infiltrates could represent pulmonary edema or multifocal pneumonia. More focal opacity in the RIGHT upper lobe, likely related to infiltrate though underlying nodule not excluded. No pleural effusion or pneumothorax. Bones demineralized. IMPRESSION: Enlargement of cardiac silhouette with pulmonary vascular congestion post CABG. Patchy BILATERAL pulmonary infiltrates question pulmonary edema versus multifocal pneumonia. Follow-up exams until resolution recommended to exclude underlying pulmonary nodule in the RIGHT upper lobe. Electronically Signed   By: Ulyses SouthwardMark  Boles M.D.   On: 08/27/2017 17:02   Ct Chest Wo Contrast Result Date: 08/27/2017 IMPRESSION: 1. Airspace and ground-glass opacity which is bilateral and peripheral predominant. Concurrent bilateral pleural effusions. Favor pulmonary edema and superimposed infection. Inflammatory etiologies such as organizing pneumonia felt less likely. Consider antibiotic therapy and diuresis with subsequent plain film radiographic follow-up. 2.  Emphysema (ICD10-J43.9). 3.  Aortic Atherosclerosis (ICD10-I70.0). 4. Pulmonary artery enlargement suggests pulmonary arterial hypertension. 5. Bilateral pulmonary nodules of maximally 7 mm. 6. Thoracic  adenopathy, favored to be reactive.    Medications:     Current Medications:   Infusions: . cefTRIAXone (ROCEPHIN)  IV 1 g (08/27/17 2004)        Assessment/Plan    1. Narrow complex tachycardia  The patient is tachycardiac with a heart rate in the 120s to 140s. Differential diagnosis can include atypical atrial flutter versus atrial tachycardia versus atrial fibrillation.   He is symptomatic because of his heart rhythm with reduction in his exercise tolerance. He does not appear to be in fulminant heart failure. I would hold-off on any diuresis for now.  - Admit to telemetry - Repeat 12 lead ECGs at regular intervals - Attempt to control rate with IV/PO beta-blockers if heart rate >120 bpm - Consider a pulmonic process (inflammation vs infection) as a trigger for the arrhythmia   2. CAD with CABG in 2000  There are no dynamic ST changes on the ECG. The troponin is 0.09. The presentation is atypical for an acute coronary syndrome. Would investigate other etiologies of shortness of breath  - Continue ASA, Metoprolol and Statins  3. Essential hypertension  - Continue Metoprolol  4. Renal insufficiency  The serum creatinine is elevated (from 1.64  two years ago to 2.36 now). The patient does admit to poor oral intake. He is known to have an abnormal renal ultrasound in the past  - Monitor serum creatinine and GFR - Urine electrolytes  5. Shortness of breath  There are bilateral pulmonary infiltrates on CXR. Would rule out a community acquired pneumonia.  CT chest reveals moderate cardiomegaly. There is suspected bilateral hilar adenopathy. There are small bilateral pleural effusions. Also noted are ground glass opacities, patchy airspaces, pulmonary nodules and pulmonary artery enlargement (? pulm hypertension).    Lonie PeakMohammed W Akhter, MD  08/27/2017, 8:22 PM  Cardiology Overnight Team Please contact [facility] for night-coverage after hours (4p -7a ) and  weekends on [email or web address]

## 2017-08-27 NOTE — ED Notes (Signed)
Pt ambulated to the restroom without difficulty. Gait steady and even.  

## 2017-08-28 ENCOUNTER — Other Ambulatory Visit: Payer: Self-pay

## 2017-08-28 ENCOUNTER — Inpatient Hospital Stay (HOSPITAL_COMMUNITY): Payer: Medicare Other

## 2017-08-28 DIAGNOSIS — I361 Nonrheumatic tricuspid (valve) insufficiency: Secondary | ICD-10-CM

## 2017-08-28 DIAGNOSIS — E7800 Pure hypercholesterolemia, unspecified: Secondary | ICD-10-CM

## 2017-08-28 DIAGNOSIS — R911 Solitary pulmonary nodule: Secondary | ICD-10-CM

## 2017-08-28 DIAGNOSIS — J9621 Acute and chronic respiratory failure with hypoxia: Secondary | ICD-10-CM

## 2017-08-28 DIAGNOSIS — R000 Tachycardia, unspecified: Secondary | ICD-10-CM

## 2017-08-28 DIAGNOSIS — R0602 Shortness of breath: Secondary | ICD-10-CM

## 2017-08-28 DIAGNOSIS — J441 Chronic obstructive pulmonary disease with (acute) exacerbation: Secondary | ICD-10-CM

## 2017-08-28 LAB — BASIC METABOLIC PANEL
ANION GAP: 11 (ref 5–15)
Anion gap: 14 (ref 5–15)
BUN: 54 mg/dL — ABNORMAL HIGH (ref 6–20)
BUN: 60 mg/dL — AB (ref 6–20)
CALCIUM: 9.3 mg/dL (ref 8.9–10.3)
CALCIUM: 9.2 mg/dL (ref 8.9–10.3)
CHLORIDE: 102 mmol/L (ref 101–111)
CO2: 25 mmol/L (ref 22–32)
CO2: 23 mmol/L (ref 22–32)
CREATININE: 2.64 mg/dL — AB (ref 0.61–1.24)
CREATININE: 2.57 mg/dL — AB (ref 0.61–1.24)
Chloride: 104 mmol/L (ref 101–111)
GFR calc Af Amer: 27 mL/min — ABNORMAL LOW (ref >60)
GFR calc non Af Amer: 23 mL/min — ABNORMAL LOW (ref >60)
GFR, EST AFRICAN AMERICAN: 26 mL/min — AB (ref >60)
GFR, EST NON AFRICAN AMERICAN: 23 mL/min — AB (ref >60)
Glucose, Bld: 184 mg/dL — ABNORMAL HIGH (ref 65–99)
Glucose, Bld: 168 mg/dL — ABNORMAL HIGH (ref 65–99)
Potassium: 5.4 mmol/L — ABNORMAL HIGH (ref 3.5–5.1)
Potassium: 3.9 mmol/L (ref 3.5–5.1)
SODIUM: 140 mmol/L (ref 135–145)
SODIUM: 139 mmol/L (ref 135–145)

## 2017-08-28 LAB — RESPIRATORY PANEL BY PCR
Adenovirus: NOT DETECTED
Bordetella pertussis: NOT DETECTED
CORONAVIRUS 229E-RVPPCR: NOT DETECTED
CORONAVIRUS HKU1-RVPPCR: NOT DETECTED
CORONAVIRUS NL63-RVPPCR: NOT DETECTED
CORONAVIRUS OC43-RVPPCR: NOT DETECTED
Chlamydophila pneumoniae: NOT DETECTED
Influenza A: NOT DETECTED
Influenza B: NOT DETECTED
METAPNEUMOVIRUS-RVPPCR: NOT DETECTED
Mycoplasma pneumoniae: NOT DETECTED
PARAINFLUENZA VIRUS 1-RVPPCR: NOT DETECTED
Parainfluenza Virus 2: NOT DETECTED
Parainfluenza Virus 3: NOT DETECTED
Parainfluenza Virus 4: NOT DETECTED
Respiratory Syncytial Virus: NOT DETECTED
Rhinovirus / Enterovirus: NOT DETECTED

## 2017-08-28 LAB — BRAIN NATRIURETIC PEPTIDE: B NATRIURETIC PEPTIDE 5: 3310.3 pg/mL — AB (ref 0.0–100.0)

## 2017-08-28 LAB — URINALYSIS, ROUTINE W REFLEX MICROSCOPIC
BACTERIA UA: NONE SEEN
BILIRUBIN URINE: NEGATIVE
Glucose, UA: NEGATIVE mg/dL
Hgb urine dipstick: NEGATIVE
KETONES UR: NEGATIVE mg/dL
Leukocytes, UA: NEGATIVE
Nitrite: NEGATIVE
PROTEIN: 30 mg/dL — AB
Specific Gravity, Urine: 1.013 (ref 1.005–1.030)
pH: 5 (ref 5.0–8.0)

## 2017-08-28 LAB — LIPID PANEL
CHOL/HDL RATIO: 3.7 ratio
Cholesterol: 119 mg/dL (ref 0–200)
HDL: 32 mg/dL — AB (ref >40)
LDL CALC: 79 mg/dL (ref 0–99)
TRIGLYCERIDES: 41 mg/dL (ref <150)
VLDL: 8 mg/dL (ref 0–40)

## 2017-08-28 LAB — ECHOCARDIOGRAM COMPLETE
Height: 62 in
WEIGHTICAEL: 1703.71 [oz_av]

## 2017-08-28 LAB — PROTEIN / CREATININE RATIO, URINE
Creatinine, Urine: 87.06 mg/dL
PROTEIN CREATININE RATIO: 0.62 mg/mg{creat} — AB (ref 0.00–0.15)
Total Protein, Urine: 54 mg/dL

## 2017-08-28 LAB — TROPONIN I
Troponin I: 0.06 ng/mL (ref <0.03)
Troponin I: 0.06 ng/mL (ref <0.03)
Troponin I: 0.04 ng/mL (ref <0.03)

## 2017-08-28 LAB — CREATININE, URINE, RANDOM: CREATININE, URINE: 28.35 mg/dL

## 2017-08-28 LAB — STREP PNEUMONIAE URINARY ANTIGEN: Strep Pneumo Urinary Antigen: NEGATIVE

## 2017-08-28 LAB — HEMOGLOBIN A1C
Hgb A1c MFr Bld: 6.4 % — ABNORMAL HIGH (ref 4.8–5.6)
Mean Plasma Glucose: 136.98 mg/dL

## 2017-08-28 LAB — HIV ANTIBODY (ROUTINE TESTING W REFLEX): HIV Screen 4th Generation wRfx: NONREACTIVE

## 2017-08-28 MED ORDER — PREDNISONE 20 MG PO TABS
40.0000 mg | ORAL_TABLET | Freq: Every day | ORAL | Status: DC
  Administered 2017-08-29: 40 mg via ORAL
  Filled 2017-08-28 (×2): qty 2

## 2017-08-28 MED ORDER — LEVALBUTEROL HCL 1.25 MG/0.5ML IN NEBU
1.2500 mg | INHALATION_SOLUTION | Freq: Three times a day (TID) | RESPIRATORY_TRACT | Status: DC
  Administered 2017-08-28 – 2017-08-29 (×4): 1.25 mg via RESPIRATORY_TRACT
  Filled 2017-08-28 (×4): qty 0.5

## 2017-08-28 MED ORDER — IPRATROPIUM BROMIDE 0.02 % IN SOLN
0.5000 mg | Freq: Three times a day (TID) | RESPIRATORY_TRACT | Status: DC
  Administered 2017-08-28 – 2017-08-29 (×4): 0.5 mg via RESPIRATORY_TRACT
  Filled 2017-08-28 (×4): qty 2.5

## 2017-08-28 MED ORDER — HEPARIN BOLUS VIA INFUSION
2500.0000 [IU] | Freq: Once | INTRAVENOUS | Status: AC
  Administered 2017-08-28: 2500 [IU] via INTRAVENOUS
  Filled 2017-08-28: qty 2500

## 2017-08-28 MED ORDER — SODIUM CHLORIDE 0.9 % IV SOLN
1.0000 g | Freq: Once | INTRAVENOUS | Status: AC
  Administered 2017-08-28: 1 g via INTRAVENOUS
  Filled 2017-08-28: qty 10

## 2017-08-28 MED ORDER — PATIROMER SORBITEX CALCIUM 8.4 G PO PACK
8.4000 g | PACK | Freq: Once | ORAL | Status: AC
  Administered 2017-08-28: 8.4 g via ORAL
  Filled 2017-08-28: qty 1

## 2017-08-28 MED ORDER — HEPARIN (PORCINE) IN NACL 100-0.45 UNIT/ML-% IJ SOLN
750.0000 [IU]/h | INTRAMUSCULAR | Status: DC
  Administered 2017-08-28 – 2017-08-31 (×3): 700 [IU]/h via INTRAVENOUS
  Filled 2017-08-28 (×4): qty 250

## 2017-08-28 MED ORDER — FUROSEMIDE 10 MG/ML IJ SOLN
40.0000 mg | Freq: Once | INTRAMUSCULAR | Status: AC
  Administered 2017-08-28: 40 mg via INTRAVENOUS
  Filled 2017-08-28: qty 4

## 2017-08-28 NOTE — Progress Notes (Signed)
Patient with HR sustaining in 130-150. PRN IV Metoprolol given.  Patient asymptomatic.  Will continue to monitor.  Ipek Westra, RN

## 2017-08-28 NOTE — Progress Notes (Signed)
Pt educated about safety and importance of bed alarm during the night however pt refuses to be on bed alarm. Will continue to round on patient.   Trixy Loyola, RN    

## 2017-08-28 NOTE — Progress Notes (Signed)
Progress Note  Patient Name: Derrick Marsh Date of Encounter: 08/28/2017  Primary Cardiologist:   No primary care provider on file.   Subjective   He says that he is breathing better.  He does not notice his HR.  He has no chest pain.   Inpatient Medications    Scheduled Meds: . aspirin  324 mg Oral Daily  . atorvastatin  40 mg Oral Daily  . azithromycin  250 mg Oral Daily  . heparin  5,000 Units Subcutaneous Q8H  . ipratropium  0.5 mg Nebulization TID  . levalbuterol  1.25 mg Nebulization TID  . methylPREDNISolone (SOLU-MEDROL) injection  60 mg Intravenous Q12H  . metoprolol tartrate  25 mg Oral BID  . nicotine  21 mg Transdermal Daily   Continuous Infusions:  PRN Meds: acetaminophen, colchicine, dextromethorphan-guaiFENesin, hydrALAZINE, metoprolol tartrate, morphine injection, nitroGLYCERIN, ondansetron (ZOFRAN) IV, zolpidem   Vital Signs    Vitals:   08/28/17 0026 08/28/17 0529 08/28/17 0600 08/28/17 1129  BP:   106/71 110/76  Pulse:   (!) 105 (!) 121  Resp:   20 18  Temp:   97.7 F (36.5 C) 98 F (36.7 C)  TempSrc:   Oral Oral  SpO2: 94%  94%   Weight:   106 lb 7.7 oz (48.3 kg)   Height:  5\' 2"  (1.575 m)      Intake/Output Summary (Last 24 hours) at 08/28/2017 1312 Last data filed at 08/28/2017 1222 Gross per 24 hour  Intake 340 ml  Output 850 ml  Net -510 ml   Filed Weights   08/27/17 2331 08/28/17 0600  Weight: 106 lb 8 oz (48.3 kg) 106 lb 7.7 oz (48.3 kg)    Telemetry    Tachycardia, intermittent sinus beats.  Question flutter atypical - Personally Reviewed  ECG    NA - Personally Reviewed  Physical Exam   GEN: No acute distress.   Neck: No  JVD Cardiac: RRR, no murmurs, rubs, or gallops.  Respiratory:   Bilateral crackles at the bases GI: Soft, nontender, non-distended  MS: No  edema; No deformity. Neuro:  Nonfocal  Psych: Normal affect   Labs    Chemistry Recent Labs  Lab 08/27/17 1555  NA 138  K 4.9  CL 106  CO2 21*    GLUCOSE 108*  BUN 40*  CREATININE 2.36*  CALCIUM 9.8  GFRNONAA 26*  GFRAA 30*  ANIONGAP 11     Hematology Recent Labs  Lab 08/27/17 1555  WBC 10.6*  RBC 3.99*  HGB 12.5*  HCT 39.1  MCV 98.0  MCH 31.3  MCHC 32.0  RDW 15.6*  PLT 366    Cardiac Enzymes Recent Labs  Lab 08/27/17 2239 08/28/17 0520 08/28/17 1038  TROPONINI 0.06* 0.06* 0.04*    Recent Labs  Lab 08/27/17 1626  TROPIPOC 0.09*     BNP Recent Labs  Lab 08/27/17 2239  BNP 3,310.3*     DDimer No results for input(s): DDIMER in the last 168 hours.   Radiology    Dg Chest 2 View  Result Date: 08/27/2017 CLINICAL DATA:  Palpitations, shortness of breath with minimal exertion, chest tightness and intermittent dizziness for few days, history COPD EXAM: CHEST - 2 VIEW COMPARISON:  07/04/2009 FINDINGS: Enlargement of cardiac silhouette post CABG with mild pulmonary vascular congestion. Atherosclerotic calcification aorta. Patchy BILATERAL pulmonary infiltrates could represent pulmonary edema or multifocal pneumonia. More focal opacity in the RIGHT upper lobe, likely related to infiltrate though underlying nodule not excluded. No  pleural effusion or pneumothorax. Bones demineralized. IMPRESSION: Enlargement of cardiac silhouette with pulmonary vascular congestion post CABG. Patchy BILATERAL pulmonary infiltrates question pulmonary edema versus multifocal pneumonia. Follow-up exams until resolution recommended to exclude underlying pulmonary nodule in the RIGHT upper lobe. Electronically Signed   By: Ulyses SouthwardMark  Boles M.D.   On: 08/27/2017 17:02   Ct Chest Wo Contrast  Result Date: 08/27/2017 CLINICAL DATA:  Shortness of breath for 2 months. Worsening over the last 2 days. Tobacco abuse. EXAM: CT CHEST WITHOUT CONTRAST TECHNIQUE: Multidetector CT imaging of the chest was performed following the standard protocol without IV contrast. COMPARISON:  Plain films of earlier today.  No prior CT. FINDINGS: Cardiovascular:  Aortic and branch vessel atherosclerosis. Moderate cardiomegaly, without pericardial effusion. Median sternotomy for CABG. Pulmonary artery enlargement, outflow tract 3.1 cm. Mediastinum/Nodes: Right paratracheal node of 1.3 cm. Subcarinal node measures 1.6 cm. Suspect bilateral hilar adenopathy. Lungs/Pleura: Small bilateral pleural effusions. Advanced bullous type emphysema. Upper lung and peripheral predominant areas of patchy airspace and ground-glass opacity. Suspect some areas of mild bronchiectasis related to architectural distortion. These are basilar predominant. Pulmonary nodules, including a 5 mm left upper lobe nodule on image 29/4 and a 7 mm right lower lobe pulmonary nodule on image 99/4. Upper Abdomen: Normal imaged portions of the liver, spleen, stomach, pancreas, adrenal glands, right kidney. Marked left renal atrophy. Abdominal aortic atherosclerosis. Musculoskeletal: Moderate thoracic spondylosis. Mild to moderate T11 compression deformity. IMPRESSION: 1. Airspace and ground-glass opacity which is bilateral and peripheral predominant. Concurrent bilateral pleural effusions. Favor pulmonary edema and superimposed infection. Inflammatory etiologies such as organizing pneumonia felt less likely. Consider antibiotic therapy and diuresis with subsequent plain film radiographic follow-up. 2.  Emphysema (ICD10-J43.9). 3.  Aortic Atherosclerosis (ICD10-I70.0). 4. Pulmonary artery enlargement suggests pulmonary arterial hypertension. 5. Bilateral pulmonary nodules of maximally 7 mm. Non-contrast chest CT at 6-12 months is recommended. If the nodule is stable at time of repeat CT, then future CT at 18-24 months (from today's scan) is considered optional for low-risk patients, but is recommended for high-risk patients. This recommendation follows the consensus statement: Guidelines for Management of Incidental Pulmonary Nodules Detected on CT Images: From the Fleischner Society 2017; Radiology 2017;  284:228-243. 6. Thoracic adenopathy, favored to be reactive. Electronically Signed   By: Jeronimo GreavesKyle  Talbot M.D.   On: 08/27/2017 19:36    Cardiac Studies   Echo pending.    Patient Profile     72 y.o. male with a past medical history significant for CAD with CABG (in 2000), COPD, hypertension, hyperlipidemia, and tobacco abuse.  We were asked to see him secondary to tachycardia and dyspnea.    Assessment & Plan    TACHYCARDIA:  Probably an atypical flutter.  I will start on heparin.  Repeat EKG. Tele with intermittent sinus beats but otherwise narrow complex tach without clear flutter waves and for the most part regular.    CAD:  Mild troponin elevation.  Flat.  Likely secondary to elevated HR.   I do not strongly suspect and ACS.   CKD:  Repeat BMET.      For questions or updates, please contact [facility] Please consult [email or web address] for contact info under Cardiology/STEMI.   Signed, Rollene RotundaJames Nga Rabon, MD  08/28/2017, 1:12 PM

## 2017-08-28 NOTE — Progress Notes (Signed)
ANTICOAGULATION CONSULT NOTE - Initial Consult  Pharmacy Consult for Heparin Indication: atrial fibrillation  Allergies  Allergen Reactions  . Aleve [Naproxen] Other (See Comments)    Was told by a MD to not take this; conflicted with his other meds    Patient Measurements: Height: 5\' 2"  (157.5 cm) Weight: 106 lb 7.7 oz (48.3 kg) IBW/kg (Calculated) : 54.6 Heparin Dosing Weight: 48.3 kg  Vital Signs: Temp: 98 F (36.7 C) (06/15 1129) Temp Source: Oral (06/15 1129) BP: 110/76 (06/15 1129) Pulse Rate: 121 (06/15 1129)  Labs: Recent Labs    08/27/17 1555 08/27/17 2239 08/28/17 0520 08/28/17 1038  HGB 12.5*  --   --   --   HCT 39.1  --   --   --   PLT 366  --   --   --   CREATININE 2.36*  --   --   --   TROPONINI  --  0.06* 0.06* 0.04*    Estimated Creatinine Clearance: 19.3 mL/min (A) (by C-G formula based on SCr of 2.36 mg/dL (H)).   Medical History: Past Medical History:  Diagnosis Date  . Allergic rhinitis   . Atherosclerosis of native arteries of the extremities with intermittent claudication   . 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 HTN (hypertension)   . Hypercholesteremia   . PVC's (premature ventricular contractions)   . Tobacco abuse     Assessment: 72 yo with PMH CAD w/ CABG (2000) p/w tachycardia and dyspnea. Pharmacy consulted to dose heparin for afib. CBC low stable with no signs/symptoms of bleeding.  Goal of Therapy:  Heparin level 0.3-0.7 units/ml Monitor platelets by anticoagulation protocol: Yes   Plan:  Bolus heparin 2500 units, then Start heparin gtt 700 units/hr Daily heparin level, CBC Monitor clinical course, s/sx bleeding  Donnella Biyler Salvatore Poe, PharmD PGY1 Acute Care Pharmacy Resident  08/28/2017,1:37 PM

## 2017-08-28 NOTE — Progress Notes (Signed)
Bladder scan per order, volume ranged from 160-212 max.  Will continue to monitor.

## 2017-08-28 NOTE — Progress Notes (Signed)
Patient's heart rate up to 160 not sustained.  HR back into low 120s.  Day and night nurse doing bedside report at the time.  Patient asymptomatic. Will continue to monitor.

## 2017-08-28 NOTE — Progress Notes (Signed)
Per CCMD patient HR went up to 183 bpm, nonsustained. I was with patient at that time,he was moving around in the bed, patient was asymptomatic at that time. Scheduled BB given, will continue to monitor.  Fadia Marlar, RN

## 2017-08-28 NOTE — Progress Notes (Signed)
CRITICAL VALUE ALERT  Critical Value:  Troponin of 0.06  Date & Time Notied:  08/28/17 0600  Provider Notified: Via Amion  Orders Received/Actions taken: Waiting for response

## 2017-08-28 NOTE — Progress Notes (Signed)
PROGRESS NOTE  Derrick Marsh  ZOX:[id number] DOB: 1945-06-06 DOA: 08/27/2017 PCP: Renford Dills, MD  Outpatient Specialists: [facility] Brief Narrative: Derrick Marsh is a 72 y.o. male with a history of CAD s/p CABG 2000, PAD, HTN, HLD, COPD not on home oxygen and tobacco use (quit a couple weeks ago) who presented to the ED with shortness of breath for a few weeks worsening initially on exertion, now also at rest not improved by OTC cold/cough preparations including mucinex-D. He presented on the advice of his PCP after abnormal ECG in the office. On arrival troponin 0.09, BNP 3310, creatinine elevated at 2.36 from unknown baseline, WBC 10.6. He was hypoxic and afebrile. ECG demonstrated atypical AFlutter with rapid ventricular response. CT chest demonstrated peripheral-predominant airspace opacities concerning for pulmonary edema and superimposed infection on background of bullous emphysema and bronchiectasis.  Lasix and azithromycin were given, cardiology consulted and pt was admitted.   Assessment & Plan: Principal Problem:   Acute on chronic respiratory failure with hypoxia (HCC) Active Problems:   CAD (coronary artery disease)   Hypercholesteremia   HTN (hypertension)   Tobacco abuse   Acute renal failure superimposed on stage 3 chronic kidney disease (HCC)   COPD with acute exacerbation (HCC)   Lung nodule   Elevated troponin  Acute hypoxic respiratory failure: Due to atypical infection, pulmonary edema, and rapid arrhythmia on baseline poor pulmonary function/reserve.  - Continue supplemental oxygen as needed - Treat as below  Atypical atrial flutter: Rate improved and mostly regular.  - Cardiology consulted, started heparin for CHA2DS2-VASc score >1.  - Continue metoprolol po and IV prn  Atypical pneumonia: RVP negative.  - Continue azithromycin - Monitor cultures  COPD with acute exacerbation: Pretty significant bullous emphysema on CT and ~60 pack years  of smoking.  - Deescalate steroids as wheezing has resolved - Continue scheduled and prn nebs  CAD s/p CABG and mildly elevated troponin, HLD, HTN:  - Low suspicion of ACS.  - Continue ASA, statin (LDL 79), beta blocker  - HbA1c 6.4% would benefit from PCP follow up. Not starting metformin due to elevated creatinine.  - Follow up echocardiogram. Elevated BNP possibly related to advanced CKD. Has crackles still on exam, agree with additional dose of diuretic today.  AKI on stage III CKD: Followed by Dr. Allena Katz with [facility], last Cr in Sept 2018 was 1.55. - Check renal U/S (CT finding w/left atrophy), urinalysis, UPr:Cr - Trend BMP mildly worse overnight with development of hyperK - Monitor UOP.   Hyperkalemia: No mention of hemolysis.  - Given lasix, will give patiromer, CaGluc and recheck later this PM.  - D/w Dr. Marisue Humble who will consult on patient.   Pulmonary nodules: Noted on CT chest (see full report) 7mm and 5mm.  - Will need repeat imaging following abx given extensive tobacco history.   Tobacco use: Congratulated on smoking cessation these past couple weeks.  - Nicotine patch ordered  Gout: Chronic, stable.  - Continue colchicine prn  DVT prophylaxis: Heparin Code Status: Full Family Communication: None at bedside Disposition Plan: Home once improved  Consultants:   Cardiology Nephrology  Procedures:   Echocardiogram ordered  Antimicrobials:  Azithromycin 6/14 >>    Subjective: Breathing much better than at admission, still dyspneic with exertion. No chest pain/pressure. Reports having intermittent chills over the past week or so, no temperature measured. No sputum from cough.   Objective: Vitals:   08/28/17 0026 08/28/17 0529 08/28/17 0600 08/28/17 1129  BP:  106/71 110/76  Pulse:   (!) 105 (!) 121  Resp:   20 18  Temp:   97.7 F (36.5 C) 98 F (36.7 C)  TempSrc:   Oral Oral  SpO2: 94%  94%   Weight:   48.3 kg (106 lb 7.7 oz)   Height:  5\' 2"  (1.575  m)      Intake/Output Summary (Last 24 hours) at 08/28/2017 1554 Last data filed at 08/28/2017 1523 Gross per 24 hour  Intake 24420.13 ml  Output 1150 ml  Net 23270.13 ml   Filed Weights   08/27/17 2331 08/28/17 0600  Weight: 48.3 kg (106 lb 8 oz) 48.3 kg (106 lb 7.7 oz)    Gen: Chronically ill-appearing, thin, talkative, pleasant male in no distress Pulm: Non-labored breathing supplemental oxygen. Diffusely diminished with crackles at bases.  CV: Regular with rate in 100's. No murmur, rub, or gallop. No JVD, no pedal edema. GI: Abdomen soft, non-tender, non-distended, with normoactive bowel sounds. No organomegaly or masses felt. Ext: Warm, no deformities Skin: No rashes, lesions or ulcers. Diffusely covered in tattoos  Neuro: Alert and oriented. No focal neurological deficits. Psych: Judgement and insight appear normal. Mood & affect appropriate.   Data Reviewed: I have personally reviewed following labs and imaging studies  CBC: Recent Labs  Lab 08/27/17 1555  WBC 10.6*  HGB 12.5*  HCT 39.1  MCV 98.0  PLT 366   Basic Metabolic Panel: Recent Labs  Lab 08/27/17 1555  NA 138  K 4.9  CL 106  CO2 21*  GLUCOSE 108*  BUN 40*  CREATININE 2.36*  CALCIUM 9.8   GFR: Estimated Creatinine Clearance: 19.3 mL/min (A) (by C-G formula based on SCr of 2.36 mg/dL (H)). Liver Function Tests: No results for input(s): AST, ALT, ALKPHOS, BILITOT, PROT, ALBUMIN in the last 168 hours. No results for input(s): LIPASE, AMYLASE in the last 168 hours. No results for input(s): AMMONIA in the last 168 hours. Coagulation Profile: No results for input(s): INR, PROTIME in the last 168 hours. Cardiac Enzymes: Recent Labs  Lab 08/27/17 2239 08/28/17 0520 08/28/17 1038  TROPONINI 0.06* 0.06* 0.04*   BNP (last 3 results) No results for input(s): PROBNP in the last 8760 hours. HbA1C: Recent Labs    08/28/17 0520  HGBA1C 6.4*   CBG: No results for input(s): GLUCAP in the last 168  hours. Lipid Profile: Recent Labs    08/28/17 0520  CHOL 119  HDL 32*  LDLCALC 79  TRIG 41  CHOLHDL 3.7   Thyroid Function Tests: No results for input(s): TSH, T4TOTAL, FREET4, T3FREE, THYROIDAB in the last 72 hours. Anemia Panel: No results for input(s): VITAMINB12, FOLATE, FERRITIN, TIBC, IRON, RETICCTPCT in the last 72 hours. Urine analysis:    Component Value Date/Time   COLORURINE YELLOW 08/28/2017 1412   APPEARANCEUR CLEAR 08/28/2017 1412   LABSPEC 1.013 08/28/2017 1412   PHURINE 5.0 08/28/2017 1412   GLUCOSEU NEGATIVE 08/28/2017 1412   HGBUR NEGATIVE 08/28/2017 1412   BILIRUBINUR NEGATIVE 08/28/2017 1412   KETONESUR NEGATIVE 08/28/2017 1412   PROTEINUR 30 (A) 08/28/2017 1412   NITRITE NEGATIVE 08/28/2017 1412   LEUKOCYTESUR NEGATIVE 08/28/2017 1412   Recent Results (from the past 240 hour(s))  Respiratory Panel by PCR     Status: None   Collection Time: 08/27/17 10:50 PM  Result Value Ref Range Status   Adenovirus NOT DETECTED NOT DETECTED Final   Coronavirus 229E NOT DETECTED NOT DETECTED Final   Coronavirus HKU1 NOT DETECTED NOT DETECTED Final  Coronavirus NL63 NOT DETECTED NOT DETECTED Final   Coronavirus OC43 NOT DETECTED NOT DETECTED Final   Metapneumovirus NOT DETECTED NOT DETECTED Final   Rhinovirus / Enterovirus NOT DETECTED NOT DETECTED Final   Influenza A NOT DETECTED NOT DETECTED Final   Influenza B NOT DETECTED NOT DETECTED Final   Parainfluenza Virus 1 NOT DETECTED NOT DETECTED Final   Parainfluenza Virus 2 NOT DETECTED NOT DETECTED Final   Parainfluenza Virus 3 NOT DETECTED NOT DETECTED Final   Parainfluenza Virus 4 NOT DETECTED NOT DETECTED Final   Respiratory Syncytial Virus NOT DETECTED NOT DETECTED Final   Bordetella pertussis NOT DETECTED NOT DETECTED Final   Chlamydophila pneumoniae NOT DETECTED NOT DETECTED Final   Mycoplasma pneumoniae NOT DETECTED NOT DETECTED Final      Radiology Studies: Dg Chest 2 View  Result Date:  08/27/2017 CLINICAL DATA:  Palpitations, shortness of breath with minimal exertion, chest tightness and intermittent dizziness for few days, history COPD EXAM: CHEST - 2 VIEW COMPARISON:  07/04/2009 FINDINGS: Enlargement of cardiac silhouette post CABG with mild pulmonary vascular congestion. Atherosclerotic calcification aorta. Patchy BILATERAL pulmonary infiltrates could represent pulmonary edema or multifocal pneumonia. More focal opacity in the RIGHT upper lobe, likely related to infiltrate though underlying nodule not excluded. No pleural effusion or pneumothorax. Bones demineralized. IMPRESSION: Enlargement of cardiac silhouette with pulmonary vascular congestion post CABG. Patchy BILATERAL pulmonary infiltrates question pulmonary edema versus multifocal pneumonia. Follow-up exams until resolution recommended to exclude underlying pulmonary nodule in the RIGHT upper lobe. Electronically Signed   By: Ulyses SouthwardMark  Boles M.D.   On: 08/27/2017 17:02   Ct Chest Wo Contrast  Result Date: 08/27/2017 CLINICAL DATA:  Shortness of breath for 2 months. Worsening over the last 2 days. Tobacco abuse. EXAM: CT CHEST WITHOUT CONTRAST TECHNIQUE: Multidetector CT imaging of the chest was performed following the standard protocol without IV contrast. COMPARISON:  Plain films of earlier today.  No prior CT. FINDINGS: Cardiovascular: Aortic and branch vessel atherosclerosis. Moderate cardiomegaly, without pericardial effusion. Median sternotomy for CABG. Pulmonary artery enlargement, outflow tract 3.1 cm. Mediastinum/Nodes: Right paratracheal node of 1.3 cm. Subcarinal node measures 1.6 cm. Suspect bilateral hilar adenopathy. Lungs/Pleura: Small bilateral pleural effusions. Advanced bullous type emphysema. Upper lung and peripheral predominant areas of patchy airspace and ground-glass opacity. Suspect some areas of mild bronchiectasis related to architectural distortion. These are basilar predominant. Pulmonary nodules, including a  5 mm left upper lobe nodule on image 29/4 and a 7 mm right lower lobe pulmonary nodule on image 99/4. Upper Abdomen: Normal imaged portions of the liver, spleen, stomach, pancreas, adrenal glands, right kidney. Marked left renal atrophy. Abdominal aortic atherosclerosis. Musculoskeletal: Moderate thoracic spondylosis. Mild to moderate T11 compression deformity. IMPRESSION: 1. Airspace and ground-glass opacity which is bilateral and peripheral predominant. Concurrent bilateral pleural effusions. Favor pulmonary edema and superimposed infection. Inflammatory etiologies such as organizing pneumonia felt less likely. Consider antibiotic therapy and diuresis with subsequent plain film radiographic follow-up. 2.  Emphysema (ICD10-J43.9). 3.  Aortic Atherosclerosis (ICD10-I70.0). 4. Pulmonary artery enlargement suggests pulmonary arterial hypertension. 5. Bilateral pulmonary nodules of maximally 7 mm. Non-contrast chest CT at 6-12 months is recommended. If the nodule is stable at time of repeat CT, then future CT at 18-24 months (from today's scan) is considered optional for low-risk patients, but is recommended for high-risk patients. This recommendation follows the consensus statement: Guidelines for Management of Incidental Pulmonary Nodules Detected on CT Images: From the Fleischner Society 2017; Radiology 2017; 284:228-243. 6. Thoracic adenopathy, favored to  be reactive. Electronically Signed   By: Jeronimo Greaves M.D.   On: 08/27/2017 19:36    Scheduled Meds: . aspirin  324 mg Oral Daily  . atorvastatin  40 mg Oral Daily  . azithromycin  250 mg Oral Daily  . ipratropium  0.5 mg Nebulization TID  . levalbuterol  1.25 mg Nebulization TID  . methylPREDNISolone (SOLU-MEDROL) injection  60 mg Intravenous Q12H  . metoprolol tartrate  25 mg Oral BID  . nicotine  21 mg Transdermal Daily   Continuous Infusions: . heparin 700 Units/hr (08/28/17 1439)     LOS: 1 day   Time spent: 35 minutes.  Tyrone Nine,  MD [facility] [email or web address] Password [facility] 08/28/2017, 3:54 PM

## 2017-08-28 NOTE — Consult Note (Signed)
Derrick Marsh Admit Date: 08/27/2017 08/28/2017 [name] Requesting Physician:  Jarvis Newcomer MD  Reason for Consult:  AoCKD, Hyperkalemia HPI:  51M admitted overnight when he presented with progressive shortness of breath.  He was evaluated by primary care with an EKG showing atrial flutter/fibrillation prompting presentation to the emergency room.  PMH Incudes:  Former tobacco user, quit last month  CAD with history of CABG  COPD  Hypertension  Hyperlipidemia  CKD 3, followed in our office by Dr. Briant Cedar, not seen since September; creatinine have been stable at 1.5-1.6  Work-up in the emergency room included CT scan of the chest demonstrating bilateral groundglass opacities and bilateral pleural effusions most likely reflecting edema with possible pneumonia as well.  Emphysema was noted.  Atrial tachycardia treated with attempt at rate control, started on heparin, echocardiogram pending.  Creatinine was elevated at 2.36 with a BUN of 40, potassium 4.9.  Patient denies use of any nonsteroidals.  He does not take ACE inhibitors or ARB's.  No recent antibiotics.  He states that he has had increasing urinary hesitancy and weak stream over the past 2 weeks.  Renal ultrasound for this admission is pending, renal ultrasound from 2017 and 2016 reviewed demonstrating a left-sided atrophic kidney with cortical thinning right side was normal at 10.4 cm in length.  The 2016 renal ultrasound mentioned an enlarged prostate gland with mass upon the bladder base and diffuse bladder wall thickening potentially reflecting chronic bladder outlet obstruction  UA with no hematuria, pyuria; protein to creatinine ratio 0.6.  Creat (mg/dL)  Date Value  16/12/9602 1.64 (H)   Creatinine, Ser (mg/dL)  Date Value  54/11/8117 2.64 (H)  08/27/2017 2.36 (H)  ] I/Os: I/O last 3 completed shifts: In: 100 [IV Piggyback:100] Out: 550 [Urine:550]   ROS NSAIDS: Denies use IV Contrast no exposure TMP/SMX  no exposure Hypotension not present Balance of 12 systems is negative w/ exceptions as above  PMH  Past Medical History:  Diagnosis Date  . Allergic rhinitis   . Atherosclerosis of native arteries of the extremities with intermittent claudication   . 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 HTN (hypertension)   . Hypercholesteremia   . PVC's (premature ventricular contractions)   . Tobacco abuse    PSH  Past Surgical History:  Procedure Laterality Date  . CORONARY ARTERY BYPASS GRAFT  2000   FH  Family History  Problem Relation Age of Onset  . Heart disease Father   . Emphysema Father   . CVA Father   . Heart failure Mother    SH  reports that he has been smoking cigars.  He has been smoking about 0.50 packs per day. He has never used smokeless tobacco. He reports that he does not drink alcohol or use drugs. Allergies  Allergies  Allergen Reactions  . Aleve [Naproxen] Other (See Comments)    Was told by a MD to not take this; conflicted with his other meds   Home medications Prior to Admission medications   Medication Sig Start Date End Date Taking? Authorizing Provider  aspirin EC 81 MG tablet Take 81 mg by mouth daily.   Yes [provider]  atorvastatin (LIPITOR) 40 MG tablet TAKE 1 TABLET EVERY DAY 01/11/17  Yes Turner, Cornelious Bryant, MD  colchicine 0.6 MG tablet Take 0.6 mg by mouth daily as needed (for gout flares).  Yes [provider]  metoprolol tartrate (LOPRESSOR) 25 MG tablet TAKE 1/2 TABLET BY MOUTH TWICE DAILY 12/28/16  Yes Turner, Cornelious Bryantraci R, MD    Current Medications Scheduled Meds: . aspirin  324 mg Oral Daily  . atorvastatin  40 mg Oral Daily  . azithromycin  250 mg Oral Daily  . ipratropium  0.5 mg Nebulization TID  . levalbuterol  1.25 mg Nebulization TID  . metoprolol tartrate  25 mg Oral BID  . nicotine  21 mg Transdermal Daily  . [START ON 08/29/2017]  predniSONE  40 mg Oral Q breakfast   Continuous Infusions: . heparin 700 Units/hr (08/28/17 1439)   PRN Meds:.acetaminophen, colchicine, dextromethorphan-guaiFENesin, hydrALAZINE, metoprolol tartrate, morphine injection, nitroGLYCERIN, ondansetron (ZOFRAN) IV, zolpidem  CBC Recent Labs  Lab 08/27/17 1555  WBC 10.6*  HGB 12.5*  HCT 39.1  MCV 98.0  PLT 366   Basic Metabolic Panel Recent Labs  Lab 08/27/17 1555 08/28/17 1513  NA 138 140  K 4.9 5.4*  CL 106 104  CO2 21* 25  GLUCOSE 108* 184*  BUN 40* 54*  CREATININE 2.36* 2.64*  CALCIUM 9.8 9.3    Physical Exam  Blood pressure 110/76, pulse (!) 121, temperature 98 F (36.7 C), temperature source Oral, resp. rate 18, height 5\' 2"  (1.575 m), weight 48.3 kg (106 lb 7.7 oz), SpO2 94 %. GEN: No acute distress ENT: NCAT EYES: EOMI CV: Tachycardic, irregular, no murmur or rub PULM: Crackles in the bases bilaterally, clear in the upper lobes ABD: Soft, nontender, no suprapubic distention or tenderness, bowel sounds present SKIN: Tattoos throughout the entirety of his body EXT: No edema  Assessment 69M with AoCKD3 during presentation for progressive SOB, found to have atrial tachycardia and suspectel pneumonia .  1. AoCKD3; follows at our office (prev Briant CedarMattingly, no visit since his retirement) SCr was 1.6 11/2016; ? If related to subacute obstructive process with some LUTS  2. SOB, exertional and progressive, pulmonary edema / COPD / Pleural effusions; PNA; on azithromycin, prednisone, bronchodialtors 3. Atrial tachycardia on MTP + Heparin  Plan 1. Renal US pending; bladder scan now if PVR > 250 place foley 2. Will reassess after this imaging 3. Daily weights, Daily Renal Panel, Strict I/Os, Avoid nephrotoxins (NSAIDs, judicious IV Contrast)     Sabra Heckyan Esme Freund MD [phone] pgr 08/28/2017, 6:41 PM

## 2017-08-29 ENCOUNTER — Inpatient Hospital Stay (HOSPITAL_COMMUNITY): Payer: Medicare Other

## 2017-08-29 LAB — PROTIME-INR
INR: 1.16
Prothrombin Time: 14.7 seconds (ref 11.4–15.2)

## 2017-08-29 LAB — HEPARIN LEVEL (UNFRACTIONATED)
Heparin Unfractionated: 0.41 IU/mL (ref 0.30–0.70)
Heparin Unfractionated: 0.52 IU/mL (ref 0.30–0.70)

## 2017-08-29 LAB — BASIC METABOLIC PANEL
Anion gap: 11 (ref 5–15)
BUN: 60 mg/dL — AB (ref 6–20)
CHLORIDE: 104 mmol/L (ref 101–111)
CO2: 25 mmol/L (ref 22–32)
CREATININE: 2.46 mg/dL — AB (ref 0.61–1.24)
Calcium: 9.3 mg/dL (ref 8.9–10.3)
GFR calc Af Amer: 29 mL/min — ABNORMAL LOW (ref >60)
GFR calc non Af Amer: 25 mL/min — ABNORMAL LOW (ref >60)
GLUCOSE: 154 mg/dL — AB (ref 65–99)
Potassium: 3.9 mmol/L (ref 3.5–5.1)
SODIUM: 140 mmol/L (ref 135–145)

## 2017-08-29 LAB — UREA NITROGEN, URINE: Urea Nitrogen, Ur: 205 mg/dL

## 2017-08-29 MED ORDER — WARFARIN - PHARMACIST DOSING INPATIENT: Freq: Every day | Status: DC

## 2017-08-29 MED ORDER — FUROSEMIDE 10 MG/ML IJ SOLN
40.0000 mg | Freq: Three times a day (TID) | INTRAMUSCULAR | Status: DC
  Administered 2017-08-29 (×3): 40 mg via INTRAVENOUS
  Filled 2017-08-29 (×4): qty 4

## 2017-08-29 MED ORDER — ASPIRIN 81 MG PO CHEW
81.0000 mg | CHEWABLE_TABLET | Freq: Every day | ORAL | Status: DC
  Administered 2017-08-30 – 2017-09-01 (×3): 81 mg via ORAL
  Filled 2017-08-29 (×3): qty 1

## 2017-08-29 MED ORDER — METOPROLOL TARTRATE 50 MG PO TABS
50.0000 mg | ORAL_TABLET | Freq: Two times a day (BID) | ORAL | Status: DC
  Administered 2017-08-29 – 2017-08-30 (×3): 50 mg via ORAL
  Filled 2017-08-29 (×3): qty 1

## 2017-08-29 MED ORDER — IPRATROPIUM BROMIDE 0.02 % IN SOLN
0.5000 mg | Freq: Four times a day (QID) | RESPIRATORY_TRACT | Status: DC | PRN
  Administered 2017-08-30: 0.5 mg via RESPIRATORY_TRACT
  Filled 2017-08-29: qty 2.5

## 2017-08-29 MED ORDER — WARFARIN SODIUM 2 MG PO TABS
4.0000 mg | ORAL_TABLET | Freq: Once | ORAL | Status: AC
  Administered 2017-08-29: 4 mg via ORAL
  Filled 2017-08-29: qty 2

## 2017-08-29 MED ORDER — LEVALBUTEROL HCL 1.25 MG/0.5ML IN NEBU
1.2500 mg | INHALATION_SOLUTION | Freq: Four times a day (QID) | RESPIRATORY_TRACT | Status: DC | PRN
  Administered 2017-08-30: 1.25 mg via RESPIRATORY_TRACT
  Filled 2017-08-29: qty 0.5

## 2017-08-29 NOTE — Progress Notes (Signed)
Patient heart rate still 130s-150s after po metoprolol given.  Lillia AbedLindsay with cardiology made aware. MD to see patient.  Will continue to monitor.

## 2017-08-29 NOTE — Progress Notes (Signed)
Patient's heart rate this morning 130s-150s with patient laying in bed.  No chest pain, but states his breathing is different than yesterday.  Dr. Jarvis NewcomerGrunz increased metoprolol this morning and Lillia AbedLindsay with cardiology stated to give morning dose now.  Will continue to monitor.

## 2017-08-29 NOTE — Progress Notes (Signed)
Admit: 08/27/2017 LOS: 2  64M with AoCKD3 during presentation for progressive SOB, found to have atrial tachycardia, new acue systolic CHF, and suspectel pneumonia .  Subjective:  . Still with RVR overnight . TTE LVEF 20 to 25% with regional wall motion abnormalities and mild to moderate dilatation and reduction of systolic function in the right ventricle . Good UOP . He feels breathing is somewhat improved . Postvoid residual was less than 200, no catheterization; still needs renal ultrasound . Renal function stable this morning, potassium 3.9  06/15 0701 - 06/16 0700 In: 1010.6 [P.O.:840; I.V.:70.6; IV Piggyback:100] Out: 2535 [Urine:2535]  Filed Weights   08/27/17 2331 08/28/17 0600 08/29/17 0520  Weight: 48.3 kg (106 lb 8 oz) 48.3 kg (106 lb 7.7 oz) 46.8 kg (103 lb 3.2 oz)    Scheduled Meds: . aspirin  324 mg Oral Daily  . atorvastatin  40 mg Oral Daily  . azithromycin  250 mg Oral Daily  . ipratropium  0.5 mg Nebulization TID  . levalbuterol  1.25 mg Nebulization TID  . metoprolol tartrate  50 mg Oral BID  . nicotine  21 mg Transdermal Daily  . predniSONE  40 mg Oral Q breakfast   Continuous Infusions: . heparin 700 Units/hr (08/28/17 1439)   PRN Meds:.acetaminophen, colchicine, dextromethorphan-guaiFENesin, hydrALAZINE, metoprolol tartrate, morphine injection, nitroGLYCERIN, ondansetron (ZOFRAN) IV, zolpidem  Current Labs: reviewed  UA without hematuria, pyuria Protein to creatinine ratio of the urine 0.6  Physical Exam:  Blood pressure 110/88, pulse (!) 131, temperature 98.1 F (36.7 C), temperature source Oral, resp. rate 20, height 5\' 2"  (1.575 m), weight 46.8 kg (103 lb 3.2 oz), SpO2 94 %. GEN: No acute distress ENT: NCAT EYES: EOMI CV: Tachycardic, irregular, no murmur or rub PULM: Crackles in the bases bilaterally, clear in the upper lobes ABD: Soft, nontender, no suprapubic distention or tenderness, bowel sounds present SKIN: Tattoos throughout the  entirety of his body EXT: No edema  A 1. AoCKD3; follows at our office (prev Briant CedarMattingly, no visit since his retirement) SCr was 1.6 11/2016; less evidence now obstruction but still need renal ultrasound.  Probably now more of an acute cardiorenal phenomenon  2. SOB, exertional and progressive, pulmonary edema / COPD / Pleural effusions; new finding of acute systolic heart failure with regional wall motion abnormalities on echocardiogram  3. PNA; on azithromycin, prednisone, bronchodialtors 4. Atrial tachycardia on MTP + Heparin   P . Start Lasix 40 IV 3 times daily . Follow-up renal ultrasound . Daily weights, Daily Renal Panel, Strict I/Os, Avoid nephrotoxins (NSAIDs, judicious IV Contrast)  . Medication Issues; o Preferred narcotic agents for pain control are hydromorphone, fentanyl, and methadone. Morphine should not be used.  o Baclofen should be avoided o Avoid oral sodium phosphate and magnesium citrate based laxatives / bowel preps    Sabra Heckyan Reisha Wos MD 08/29/2017, 7:44 AM  Recent Labs  Lab 08/28/17 1513 08/28/17 2238 08/29/17 0601  NA 140 139 140  K 5.4* 3.9 3.9  CL 104 102 104  CO2 25 23 25   GLUCOSE 184* 168* 154*  BUN 54* 60* 60*  CREATININE 2.64* 2.57* 2.46*  CALCIUM 9.3 9.2 9.3   Recent Labs  Lab 08/27/17 1555  WBC 10.6*  HGB 12.5*  HCT 39.1  MCV 98.0  PLT 366

## 2017-08-29 NOTE — Progress Notes (Signed)
PROGRESS NOTE  Derrick Marsh  ZOX:[id number] DOB: 12-29-1945 DOA: 08/27/2017 PCP: Renford DillsPolite, Ronald, MD  Outpatient Specialists: [facility] Brief Narrative: Derrick Marsh is a 72 y.o. male with a history of CAD s/p CABG 2000, PAD, HTN, HLD, COPD not on home oxygen and tobacco use (quit a couple weeks ago) who presented to the ED with shortness of breath for a few weeks worsening initially on exertion, now also at rest not improved by OTC cold/cough preparations including mucinex-D. He presented on the advice of his PCP after abnormal ECG in the office. On arrival troponin 0.09, BNP 3310, creatinine elevated at 2.36 from unknown baseline, WBC 10.6. He was hypoxic and afebrile. ECG demonstrated atypical AFlutter with rapid ventricular response. CT chest demonstrated peripheral-predominant airspace opacities concerning for pulmonary edema and superimposed infection on background of bullous emphysema and bronchiectasis.  Lasix and azithromycin were given, cardiology consulted and pt was admitted.   Assessment & Plan: Principal Problem:   Acute on chronic respiratory failure with hypoxia (HCC) Active Problems:   CAD (coronary artery disease)   Hypercholesteremia   HTN (hypertension)   Tobacco abuse   Acute renal failure superimposed on stage 3 chronic kidney disease (HCC)   COPD with acute exacerbation (HCC)   Lung nodule   Elevated troponin  Acute hypoxic respiratory failure: Due to atypical infection, pulmonary edema, and rapid arrhythmia on baseline poor pulmonary function/reserve.  - Continue supplemental oxygen as needed - Treat as below  Atypical atrial flutter: Rate remains uncontrolled - Cardiology consulted, started heparin, bridging to coumadin. - Increase metoprolol and continue prn. Avoid dilt w/LV dysfunction - Planning TEE-DCCV. made NPO p MN in hopes this will be tomorrow.  Atypical pneumonia: RVP negative.  - Continue azithromycin. Will repeat CXR following abx  and diuresis. - Monitor cultures  COPD with acute exacerbation: Pretty significant bullous emphysema on CT and ~60 pack years of smoking.  - Deescalate steroids as wheezing has resolved - Continue scheduled and prn nebs  Acute HFrEF: New Dx. ?ICM vs. arrhythmogenic. LVEF 20-25% w/focal hypokinesis of the basal-mid inferolateral myocardium. Moderately reduced RV systolic function. IVC wnl. Mod pulm HTN.  - Augmenting diuresis per nephrology, lasix TID as ordered - Monitor I/O, daily weights  CAD s/p CABG and mildly elevated troponin, HLD, HTN: Focal WMA on echo. Cardiology with low suspicion of acute coronary syndrome.  - Continue ASA, statin (LDL 79), beta blocker  - HbA1c 6.4%: would benefit from PCP follow up. Not a candidate for metformin  AKI on stage III CKD: Followed by Dr. Briant CedarMattingly prior to retirment with [facility], last Cr in Sept 2018 was 1.55. UPr:Cr 0.62. ?due to obstruction w/LUTS but PVR 160-25312ml and now have likely answer in cardiorenal syndrome.  - Check renal U/S (CT finding w/known left atrophy). - Trend BMP in AM - Avoid nephrotoxins  Hyperkalemia: Resolved. - Monitor   Pulmonary nodules: Noted on CT chest (see full report) 7mm and 5mm.  - Will need repeat imaging following abx given extensive tobacco history.   Tobacco use: Congratulated on smoking cessation these past couple weeks.  - Nicotine patch ordered  Gout: Chronic, stable.  - Continue colchicine prn  DVT prophylaxis: Heparin Code Status: Full Family Communication: None at bedside Disposition Plan: Home once improved.   Consultants:   Cardiology Nephrology  Procedures:   Echocardiogram ordered  Antimicrobials:  Azithromycin 6/14 >>    Subjective: Got out of breath walking slowly from wheelchair to bed (<10 feet). No fever or productive  cough. No swelling. Feels better than admission.   Objective: Vitals:   08/29/17 0747 08/29/17 0808 08/29/17 1136 08/29/17 1140  BP:  102/67  105/65    Pulse:  (!) 122  (!) 109  Resp:  20  19  Temp:  98.1 F (36.7 C) 98.4 F (36.9 C)   TempSrc:  Oral Tympanic   SpO2: 94% 99%  (!) 86%  Weight:      Height:        Intake/Output Summary (Last 24 hours) at 08/29/2017 1525 Last data filed at 08/29/2017 1443 Gross per 24 hour  Intake 1463.34 ml  Output 1835 ml  Net -371.66 ml   Filed Weights   08/27/17 2331 08/28/17 0600 08/29/17 0520  Weight: 48.3 kg (106 lb 8 oz) 48.3 kg (106 lb 7.7 oz) 46.8 kg (103 lb 3.2 oz)   Gen: 72 y.o. male in no distress Pulm: Nonlabored breathing supplemental O2. Bibasilar crackles.  CV: Rapid irregular. No murmur, rub, or gallop. No JVD, no dependent edema. GI: Abdomen soft, non-tender, non-distended, with normoactive bowel sounds.  Ext: Warm, no deformities Skin: No rashes, lesions or ulcers on visualized skin. Diffuse tattoos.  Neuro: Alert and oriented. No focal neurological deficits. Psych: Judgement and insight appear fair. Mood euthymic & affect congruent. Behavior is appropriate.    Data Reviewed: I have personally reviewed following labs and imaging studies  CBC: Recent Labs  Lab 08/27/17 1555  WBC 10.6*  HGB 12.5*  HCT 39.1  MCV 98.0  PLT 366   Basic Metabolic Panel: Recent Labs  Lab 08/27/17 1555 08/28/17 1513 08/28/17 2238 08/29/17 0601  NA 138 140 139 140  K 4.9 5.4* 3.9 3.9  CL 106 104 102 104  CO2 21* 25 23 25   GLUCOSE 108* 184* 168* 154*  BUN 40* 54* 60* 60*  CREATININE 2.36* 2.64* 2.57* 2.46*  CALCIUM 9.8 9.3 9.2 9.3   GFR: Estimated Creatinine Clearance: 18 mL/min (A) (by C-G formula based on SCr of 2.46 mg/dL (H)). Liver Function Tests: No results for input(s): AST, ALT, ALKPHOS, BILITOT, PROT, ALBUMIN in the last 168 hours. No results for input(s): LIPASE, AMYLASE in the last 168 hours. No results for input(s): AMMONIA in the last 168 hours. Coagulation Profile: Recent Labs  Lab 08/29/17 1305  INR 1.16   Cardiac Enzymes: Recent Labs  Lab 08/27/17 2239  08/28/17 0520 08/28/17 1038  TROPONINI 0.06* 0.06* 0.04*   BNP (last 3 results) No results for input(s): PROBNP in the last 8760 hours. HbA1C: Recent Labs    08/28/17 0520  HGBA1C 6.4*   CBG: No results for input(s): GLUCAP in the last 168 hours. Lipid Profile: Recent Labs    08/28/17 0520  CHOL 119  HDL 32*  LDLCALC 79  TRIG 41  CHOLHDL 3.7   Thyroid Function Tests: No results for input(s): TSH, T4TOTAL, FREET4, T3FREE, THYROIDAB in the last 72 hours. Anemia Panel: No results for input(s): VITAMINB12, FOLATE, FERRITIN, TIBC, IRON, RETICCTPCT in the last 72 hours. Urine analysis:    Component Value Date/Time   COLORURINE YELLOW 08/28/2017 1412   APPEARANCEUR CLEAR 08/28/2017 1412   LABSPEC 1.013 08/28/2017 1412   PHURINE 5.0 08/28/2017 1412   GLUCOSEU NEGATIVE 08/28/2017 1412   HGBUR NEGATIVE 08/28/2017 1412   BILIRUBINUR NEGATIVE 08/28/2017 1412   KETONESUR NEGATIVE 08/28/2017 1412   PROTEINUR 30 (A) 08/28/2017 1412   NITRITE NEGATIVE 08/28/2017 1412   LEUKOCYTESUR NEGATIVE 08/28/2017 1412   Recent Results (from the past 240 hour(s))  Culture,  blood (routine x 2) Call MD if unable to obtain prior to antibiotics being given     Status: None (Preliminary result)   Collection Time: 08/27/17 10:45 PM  Result Value Ref Range Status   Specimen Description BLOOD RIGHT HAND  Final   Special Requests   Final    BOTTLES DRAWN AEROBIC AND ANAEROBIC Blood Culture adequate volume   Culture   Final    NO GROWTH 1 DAY Performed at [facility], [address], [place], [place] [address]    Report Status PENDING  Incomplete  Respiratory Panel by PCR     Status: None   Collection Time: 08/27/17 10:50 PM  Result Value Ref Range Status   Adenovirus NOT DETECTED NOT DETECTED Final   Coronavirus 229E NOT DETECTED NOT DETECTED Final   Coronavirus HKU1 NOT DETECTED NOT DETECTED Final   Coronavirus NL63 NOT DETECTED NOT DETECTED Final   Coronavirus OC43 NOT DETECTED NOT  DETECTED Final   Metapneumovirus NOT DETECTED NOT DETECTED Final   Rhinovirus / Enterovirus NOT DETECTED NOT DETECTED Final   Influenza A NOT DETECTED NOT DETECTED Final   Influenza B NOT DETECTED NOT DETECTED Final   Parainfluenza Virus 1 NOT DETECTED NOT DETECTED Final   Parainfluenza Virus 2 NOT DETECTED NOT DETECTED Final   Parainfluenza Virus 3 NOT DETECTED NOT DETECTED Final   Parainfluenza Virus 4 NOT DETECTED NOT DETECTED Final   Respiratory Syncytial Virus NOT DETECTED NOT DETECTED Final   Bordetella pertussis NOT DETECTED NOT DETECTED Final   Chlamydophila pneumoniae NOT DETECTED NOT DETECTED Final   Mycoplasma pneumoniae NOT DETECTED NOT DETECTED Final  Culture, blood (routine x 2) Call MD if unable to obtain prior to antibiotics being given     Status: None (Preliminary result)   Collection Time: 08/27/17 11:00 PM  Result Value Ref Range Status   Specimen Description BLOOD RIGHT ANTECUBITAL  Final   Special Requests   Final    BOTTLES DRAWN AEROBIC AND ANAEROBIC Blood Culture results may not be optimal due to an excessive volume of blood received in culture bottles   Culture   Final    NO GROWTH 1 DAY Performed at [facility], [address], [place], [place] [address]    Report Status PENDING  Incomplete      Radiology Studies: Dg Chest 2 View  Result Date: 08/27/2017 CLINICAL DATA:  Palpitations, shortness of breath with minimal exertion, chest tightness and intermittent dizziness for few days, history COPD EXAM: CHEST - 2 VIEW COMPARISON:  07/04/2009 FINDINGS: Enlargement of cardiac silhouette post CABG with mild pulmonary vascular congestion. Atherosclerotic calcification aorta. Patchy BILATERAL pulmonary infiltrates could represent pulmonary edema or multifocal pneumonia. More focal opacity in the RIGHT upper lobe, likely related to infiltrate though underlying nodule not excluded. No pleural effusion or pneumothorax. Bones demineralized. IMPRESSION: Enlargement  of cardiac silhouette with pulmonary vascular congestion post CABG. Patchy BILATERAL pulmonary infiltrates question pulmonary edema versus multifocal pneumonia. Follow-up exams until resolution recommended to exclude underlying pulmonary nodule in the RIGHT upper lobe. Electronically Signed   By: Ulyses Southward M.D.   On: 08/27/2017 17:02   Ct Chest Wo Contrast  Result Date: 08/27/2017 CLINICAL DATA:  Shortness of breath for 2 months. Worsening over the last 2 days. Tobacco abuse. EXAM: CT CHEST WITHOUT CONTRAST TECHNIQUE: Multidetector CT imaging of the chest was performed following the standard protocol without IV contrast. COMPARISON:  Plain films of earlier today.  No prior CT. FINDINGS: Cardiovascular: Aortic  and branch vessel atherosclerosis. Moderate cardiomegaly, without pericardial effusion. Median sternotomy for CABG. Pulmonary artery enlargement, outflow tract 3.1 cm. Mediastinum/Nodes: Right paratracheal node of 1.3 cm. Subcarinal node measures 1.6 cm. Suspect bilateral hilar adenopathy. Lungs/Pleura: Small bilateral pleural effusions. Advanced bullous type emphysema. Upper lung and peripheral predominant areas of patchy airspace and ground-glass opacity. Suspect some areas of mild bronchiectasis related to architectural distortion. These are basilar predominant. Pulmonary nodules, including a 5 mm left upper lobe nodule on image 29/4 and a 7 mm right lower lobe pulmonary nodule on image 99/4. Upper Abdomen: Normal imaged portions of the liver, spleen, stomach, pancreas, adrenal glands, right kidney. Marked left renal atrophy. Abdominal aortic atherosclerosis. Musculoskeletal: Moderate thoracic spondylosis. Mild to moderate T11 compression deformity. IMPRESSION: 1. Airspace and ground-glass opacity which is bilateral and peripheral predominant. Concurrent bilateral pleural effusions. Favor pulmonary edema and superimposed infection. Inflammatory etiologies such as organizing pneumonia felt less likely.  Consider antibiotic therapy and diuresis with subsequent plain film radiographic follow-up. 2.  Emphysema (ICD10-J43.9). 3.  Aortic Atherosclerosis (ICD10-I70.0). 4. Pulmonary artery enlargement suggests pulmonary arterial hypertension. 5. Bilateral pulmonary nodules of maximally 7 mm. Non-contrast chest CT at 6-12 months is recommended. If the nodule is stable at time of repeat CT, then future CT at 18-24 months (from today's scan) is considered optional for low-risk patients, but is recommended for high-risk patients. This recommendation follows the consensus statement: Guidelines for Management of Incidental Pulmonary Nodules Detected on CT Images: From the Fleischner Society 2017; Radiology 2017; 284:228-243. 6. Thoracic adenopathy, favored to be reactive. Electronically Signed   By: Jeronimo Greaves M.D.   On: 08/27/2017 19:36    Scheduled Meds: . [START ON 08/30/2017] aspirin  81 mg Oral Daily  . atorvastatin  40 mg Oral Daily  . azithromycin  250 mg Oral Daily  . furosemide  40 mg Intravenous TID  . metoprolol tartrate  50 mg Oral BID  . nicotine  21 mg Transdermal Daily  . predniSONE  40 mg Oral Q breakfast  . warfarin  4 mg Oral ONCE-1800  . Warfarin - Pharmacist Dosing Inpatient   Does not apply q1800   Continuous Infusions: . heparin 700 Units/hr (08/28/17 1439)     LOS: 2 days   Time spent: 35 minutes.  Tyrone Nine, MD [facility] [email or web address] Password [facility] 08/29/2017, 3:25 PM

## 2017-08-29 NOTE — Progress Notes (Addendum)
ANTICOAGULATION CONSULT NOTE  Pharmacy Consult for Heparin and Warfarin Indication: atrial fibrillation  Allergies  Allergen Reactions  . Aleve [Naproxen] Other (See Comments)    Was told by a MD to not take this; conflicted with his other meds    Patient Measurements: Height: 5\' 2"  (157.5 cm) Weight: 103 lb 3.2 oz (46.8 kg) IBW/kg (Calculated) : 54.6 Heparin Dosing Weight: 48.3 kg  Vital Signs: Temp: 98.4 F (36.9 C) (06/16 1136) Temp Source: Tympanic (06/16 1136) BP: 105/65 (06/16 1140) Pulse Rate: 109 (06/16 1140)  Labs: Recent Labs    08/27/17 1555 08/27/17 2239 08/28/17 0520 08/28/17 1038 08/28/17 1513 08/28/17 2238 08/29/17 0601 08/29/17 1305  HGB 12.5*  --   --   --   --   --   --   --   HCT 39.1  --   --   --   --   --   --   --   PLT 366  --   --   --   --   --   --   --   LABPROT  --   --   --   --   --   --   --  14.7  INR  --   --   --   --   --   --   --  1.16  HEPARINUNFRC  --   --   --   --   --  0.41 0.52  --   CREATININE 2.36*  --   --   --  2.64* 2.57* 2.46*  --   TROPONINI  --  0.06* 0.06* 0.04*  --   --   --   --     Estimated Creatinine Clearance: 18 mL/min (A) (by C-G formula based on SCr of 2.46 mg/dL (H)).   Medical History: Past Medical History:  Diagnosis Date  . Allergic rhinitis   . Atherosclerosis of native arteries of the extremities with intermittent claudication   . 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 HTN (hypertension)   . Hypercholesteremia   . PVC's (premature ventricular contractions)   . Tobacco abuse     Assessment: 72 yo with PMH CAD w/ CABG (2000) p/w tachycardia and dyspnea. Pharmacy consulted to dose heparin/warfarin for afib. CBC low stable with no signs/symptoms of bleeding. Per MD continue heparin until after TEE/DCCV and until INR therapeutic. Good PO intake, no signs/sx bleeding, and DDI noted with warfarin and azithromycin  and aspirin.  Given low weight, and drug interactions, will start lower than 5mg  warfarin dose.  Goal of Therapy:  Heparin level 0.3-0.7 units/ml Monitor platelets by anticoagulation protocol: Yes   Addendum: Baseline INR 1.16  Plan:  Continue heparin gtt 700 units/hr Warfarin 4 mg PO x 1 Baseline INR, then  Daily INR, CBCs, heparin level F/u s/sx bleeding, PO intake, DDI  Donnella Biyler Fatim Vanderschaaf, PharmD PGY1 Acute Care Pharmacy Resident  08/29/2017,12:55 PM

## 2017-08-29 NOTE — Plan of Care (Signed)
  Problem: Health Behavior/Discharge Planning: Goal: Ability to manage health-related needs will improve Outcome: Progressing   Problem: Clinical Measurements: Goal: Ability to maintain clinical measurements within normal limits will improve Outcome: Progressing   Problem: Clinical Measurements: Goal: Respiratory complications will improve Outcome: Progressing   Problem: Safety: Goal: Ability to remain free from injury will improve Outcome: Progressing   

## 2017-08-29 NOTE — Progress Notes (Addendum)
Patient HR 150-170, patient asymptomatic. States he had just used the urinal when HR was around 170. Metoprolol IV given, now HR 120-140's. Will continue to monitor.   HR continuously high.

## 2017-08-29 NOTE — Progress Notes (Signed)
ANTICOAGULATION CONSULT NOTE - Follow Up Consult  Pharmacy Consult for heparin Indication: atrial fibrillation  Labs: Recent Labs    08/27/17 1555 08/27/17 2239 08/28/17 0520 08/28/17 1038 08/28/17 1513 08/28/17 2238  HGB 12.5*  --   --   --   --   --   HCT 39.1  --   --   --   --   --   PLT 366  --   --   --   --   --   HEPARINUNFRC  --   --   --   --   --  0.41  CREATININE 2.36*  --   --   --  2.64* 2.57*  TROPONINI  --  0.06* 0.06* 0.04*  --   --     Assessment/Plan:  72yo male therapeutic on heparin with initial dosing for Afib. Will continue gtt at current rate and confirm stable with am labs.   Vernard GamblesVeronda Gailyn Crook, PharmD, BCPS  08/29/2017,12:14 AM

## 2017-08-29 NOTE — Progress Notes (Signed)
Progress Note  Patient Name: Derrick Marsh Date of Encounter: 08/29/2017  Primary Cardiologist:   No primary care provider on file.   Subjective   Breathing OK.  No pain.     Inpatient Medications    Scheduled Meds: . aspirin  324 mg Oral Daily  . atorvastatin  40 mg Oral Daily  . azithromycin  250 mg Oral Daily  . furosemide  40 mg Intravenous TID  . ipratropium  0.5 mg Nebulization TID  . levalbuterol  1.25 mg Nebulization TID  . metoprolol tartrate  50 mg Oral BID  . nicotine  21 mg Transdermal Daily  . predniSONE  40 mg Oral Q breakfast   Continuous Infusions: . heparin 700 Units/hr (08/28/17 1439)   PRN Meds: acetaminophen, colchicine, dextromethorphan-guaiFENesin, hydrALAZINE, metoprolol tartrate, morphine injection, nitroGLYCERIN, ondansetron (ZOFRAN) IV, zolpidem   Vital Signs    Vitals:   08/29/17 0747 08/29/17 0808 08/29/17 1136 08/29/17 1140  BP:  102/67  105/65  Pulse:  (!) 122  (!) 109  Resp:  20  19  Temp:  98.1 F (36.7 C) 98.4 F (36.9 C)   TempSrc:  Oral Tympanic   SpO2: 94% 99%  (!) 86%  Weight:      Height:        Intake/Output Summary (Last 24 hours) at 08/29/2017 1141 Last data filed at 08/29/2017 1610 Gross per 24 hour  Intake 770.59 ml  Output 2685 ml  Net -1914.41 ml   Filed Weights   08/27/17 2331 08/28/17 0600 08/29/17 0520  Weight: 106 lb 8 oz (48.3 kg) 106 lb 7.7 oz (48.3 kg) 103 lb 3.2 oz (46.8 kg)    Telemetry    Atrial fib/flutter  - Personally Reviewed  ECG    NA - Personally Reviewed  Physical Exam   GEN: No  acute distress.   Neck: No  JVD Cardiac: Irregular RR, no murmurs, rubs, or gallops.  Respiratory:    Left basilar crackles GI: Soft, nontender, non-distended, normal bowel sounds  MS:  No edema; No deformity. Neuro:   Nonfocal  Psych: Oriented and appropriate    Labs    Chemistry Recent Labs  Lab 08/28/17 1513 08/28/17 2238 08/29/17 0601  NA 140 139 140  K 5.4* 3.9 3.9  CL 104 102 104    CO2 25 23 25   GLUCOSE 184* 168* 154*  BUN 54* 60* 60*  CREATININE 2.64* 2.57* 2.46*  CALCIUM 9.3 9.2 9.3  GFRNONAA 23* 23* 25*  GFRAA 26* 27* 29*  ANIONGAP 11 14 11      Hematology Recent Labs  Lab 08/27/17 1555  WBC 10.6*  RBC 3.99*  HGB 12.5*  HCT 39.1  MCV 98.0  MCH 31.3  MCHC 32.0  RDW 15.6*  PLT 366    Cardiac Enzymes Recent Labs  Lab 08/27/17 2239 08/28/17 0520 08/28/17 1038  TROPONINI 0.06* 0.06* 0.04*    Recent Labs  Lab 08/27/17 1626  TROPIPOC 0.09*     BNP Recent Labs  Lab 08/27/17 2239  BNP 3,310.3*     DDimer No results for input(s): DDIMER in the last 168 hours.   Radiology    Dg Chest 2 View  Result Date: 08/27/2017 CLINICAL DATA:  Palpitations, shortness of breath with minimal exertion, chest tightness and intermittent dizziness for few days, history COPD EXAM: CHEST - 2 VIEW COMPARISON:  07/04/2009 FINDINGS: Enlargement of cardiac silhouette post CABG with mild pulmonary vascular congestion. Atherosclerotic calcification aorta. Patchy BILATERAL pulmonary infiltrates could represent pulmonary  edema or multifocal pneumonia. More focal opacity in the RIGHT upper lobe, likely related to infiltrate though underlying nodule not excluded. No pleural effusion or pneumothorax. Bones demineralized. IMPRESSION: Enlargement of cardiac silhouette with pulmonary vascular congestion post CABG. Patchy BILATERAL pulmonary infiltrates question pulmonary edema versus multifocal pneumonia. Follow-up exams until resolution recommended to exclude underlying pulmonary nodule in the RIGHT upper lobe. Electronically Signed   By: Ulyses SouthwardMark  Boles M.D.   On: 08/27/2017 17:02   Ct Chest Wo Contrast  Result Date: 08/27/2017 CLINICAL DATA:  Shortness of breath for 2 months. Worsening over the last 2 days. Tobacco abuse. EXAM: CT CHEST WITHOUT CONTRAST TECHNIQUE: Multidetector CT imaging of the chest was performed following the standard protocol without IV contrast. COMPARISON:   Plain films of earlier today.  No prior CT. FINDINGS: Cardiovascular: Aortic and branch vessel atherosclerosis. Moderate cardiomegaly, without pericardial effusion. Median sternotomy for CABG. Pulmonary artery enlargement, outflow tract 3.1 cm. Mediastinum/Nodes: Right paratracheal node of 1.3 cm. Subcarinal node measures 1.6 cm. Suspect bilateral hilar adenopathy. Lungs/Pleura: Small bilateral pleural effusions. Advanced bullous type emphysema. Upper lung and peripheral predominant areas of patchy airspace and ground-glass opacity. Suspect some areas of mild bronchiectasis related to architectural distortion. These are basilar predominant. Pulmonary nodules, including a 5 mm left upper lobe nodule on image 29/4 and a 7 mm right lower lobe pulmonary nodule on image 99/4. Upper Abdomen: Normal imaged portions of the liver, spleen, stomach, pancreas, adrenal glands, right kidney. Marked left renal atrophy. Abdominal aortic atherosclerosis. Musculoskeletal: Moderate thoracic spondylosis. Mild to moderate T11 compression deformity. IMPRESSION: 1. Airspace and ground-glass opacity which is bilateral and peripheral predominant. Concurrent bilateral pleural effusions. Favor pulmonary edema and superimposed infection. Inflammatory etiologies such as organizing pneumonia felt less likely. Consider antibiotic therapy and diuresis with subsequent plain film radiographic follow-up. 2.  Emphysema (ICD10-J43.9). 3.  Aortic Atherosclerosis (ICD10-I70.0). 4. Pulmonary artery enlargement suggests pulmonary arterial hypertension. 5. Bilateral pulmonary nodules of maximally 7 mm. Non-contrast chest CT at 6-12 months is recommended. If the nodule is stable at time of repeat CT, then future CT at 18-24 months (from today's scan) is considered optional for low-risk patients, but is recommended for high-risk patients. This recommendation follows the consensus statement: Guidelines for Management of Incidental Pulmonary Nodules Detected on  CT Images: From the Fleischner Society 2017; Radiology 2017; 284:228-243. 6. Thoracic adenopathy, favored to be reactive. Electronically Signed   By: Jeronimo GreavesKyle  Talbot M.D.   On: 08/27/2017 19:36    Cardiac Studies   ECHO:  08/28/17   Study Conclusions  - Left ventricle: The cavity size was normal. Wall thickness was   normal. Systolic function was severely reduced. The estimated   ejection fraction was in the range of 20% to 25%. Diffuse   hypokinesis. - Regional wall motion abnormality: Severe hypokinesis of the   basal-mid inferolateral myocardium. - Aortic valve: Mildly calcified annulus. Trileaflet; moderately   thickened leaflets. Valve area (VTI): 1.59 cm^2. Valve area   (Vmax): 1.54 cm^2. Valve area (Vmean): 1.63 cm^2. - Mitral valve: Mildly calcified annulus. Mildly thickened leaflets   . There was mild regurgitation. Valve area by pressure half-time:   1.16 cm^2. - Left atrium: The atrium was severely dilated. - Right ventricle: The cavity size was mildly to moderately   dilated. Systolic function was moderately reduced. - Right atrium: The atrium was moderately dilated. - Atrial septum: No defect or patent foramen ovale was identified. - Tricuspid valve: There was mild-moderate regurgitation. - Pulmonary arteries: Systolic pressure was  moderately increased.   PA peak pressure: 40 mm Hg (S).   Patient Profile     72 y.o. male with a past medical history significant for CAD with CABG (in 2000), COPD, hypertension, hyperlipidemia, and tobacco abuse.  We were asked to see him secondary to tachycardia and dyspnea.    Assessment & Plan    TACHYCARDIA:  Fib flutter.  Started heparin yesterday.  Will start warfarin and he will need TEE/DCCV.  I don't know if we can do this tomorrow but we will try to schedule.     CAD:  Trop is mildly elevated and I do not suspect ACS  CKD:  Creat is stable.    ACUTE SYSTOLIC HF:  EF severely reduced as above.  Down 1.9 liters since  admission.  Etiology is not clear.  I looked through Epic and I cannot find a previous EF.  I cannot find the notes to open from his bypass in 2000.  This could be progression of an ischemic cardiomyopathy or a rate related cardiomyopathy. Not able to start ACE inhibitor/ARB.      For questions or updates, please contact [facility] Please consult [email or web address] for contact info under Cardiology/STEMI.   Signed, Rollene Rotunda, MD  08/29/2017, 11:41 AM

## 2017-08-29 NOTE — Progress Notes (Signed)
ANTICOAGULATION CONSULT NOTE - Initial Consult  Pharmacy Consult for Heparin Indication: atrial fibrillation  Allergies  Allergen Reactions  . Aleve [Naproxen] Other (See Comments)    Was told by a MD to not take this; conflicted with his other meds    Patient Measurements: Height: 5\' 2"  (157.5 cm) Weight: 103 lb 3.2 oz (46.8 kg) IBW/kg (Calculated) : 54.6 Heparin Dosing Weight: 48.3 kg  Vital Signs: Temp: 98.1 F (36.7 C) (06/16 0520) Temp Source: Oral (06/16 0520) BP: 110/88 (06/16 0520) Pulse Rate: 131 (06/16 0520)  Labs: Recent Labs    08/27/17 1555 08/27/17 2239 08/28/17 0520 08/28/17 1038 08/28/17 1513 08/28/17 2238 08/29/17 0601  HGB 12.5*  --   --   --   --   --   --   HCT 39.1  --   --   --   --   --   --   PLT 366  --   --   --   --   --   --   HEPARINUNFRC  --   --   --   --   --  0.41 0.52  CREATININE 2.36*  --   --   --  2.64* 2.57* 2.46*  TROPONINI  --  0.06* 0.06* 0.04*  --   --   --     Estimated Creatinine Clearance: 18 mL/min (A) (by C-G formula based on SCr of 2.46 mg/dL (H)).   Medical History: Past Medical History:  Diagnosis Date  . Allergic rhinitis   . Atherosclerosis of native arteries of the extremities with intermittent claudication   . 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 HTN (hypertension)   . Hypercholesteremia   . PVC's (premature ventricular contractions)   . Tobacco abuse     Assessment: 72 yo with PMH CAD w/ CABG (2000) p/w tachycardia and dyspnea. Pharmacy consulted to dose heparin for afib. CBC low stable with no signs/symptoms of bleeding.  Goal of Therapy:  Heparin level 0.3-0.7 units/ml Monitor platelets by anticoagulation protocol: Yes   Plan:  Continue heparin gtt 700 units/hr Daily heparin level, CBC Monitor clinical course, s/sx bleeding  Donnella Biyler Carli Lefevers, PharmD PGY1 Acute Care Pharmacy Resident  08/29/2017,7:52 AM

## 2017-08-30 ENCOUNTER — Inpatient Hospital Stay (HOSPITAL_COMMUNITY): Payer: Medicare Other | Admitting: Certified Registered Nurse Anesthetist

## 2017-08-30 ENCOUNTER — Encounter (HOSPITAL_COMMUNITY)
Admission: EM | Disposition: A | Discharge status: Home Health | Payer: Self-pay | Source: Home / Self Care | Attending: Cardiology

## 2017-08-30 ENCOUNTER — Inpatient Hospital Stay: Payer: Self-pay

## 2017-08-30 ENCOUNTER — Inpatient Hospital Stay (HOSPITAL_COMMUNITY): Payer: Medicare Other

## 2017-08-30 ENCOUNTER — Encounter (HOSPITAL_COMMUNITY): Payer: Self-pay | Admitting: *Deleted

## 2017-08-30 DIAGNOSIS — I10 Essential (primary) hypertension: Secondary | ICD-10-CM

## 2017-08-30 DIAGNOSIS — J189 Pneumonia, unspecified organism: Secondary | ICD-10-CM

## 2017-08-30 DIAGNOSIS — J810 Acute pulmonary edema: Secondary | ICD-10-CM

## 2017-08-30 DIAGNOSIS — I2510 Atherosclerotic heart disease of native coronary artery without angina pectoris: Secondary | ICD-10-CM

## 2017-08-30 DIAGNOSIS — I420 Dilated cardiomyopathy: Secondary | ICD-10-CM

## 2017-08-30 DIAGNOSIS — Z720 Tobacco use: Secondary | ICD-10-CM

## 2017-08-30 DIAGNOSIS — I5041 Acute combined systolic (congestive) and diastolic (congestive) heart failure: Secondary | ICD-10-CM

## 2017-08-30 DIAGNOSIS — N183 Chronic kidney disease, stage 3 (moderate): Secondary | ICD-10-CM

## 2017-08-30 DIAGNOSIS — I340 Nonrheumatic mitral (valve) insufficiency: Secondary | ICD-10-CM

## 2017-08-30 DIAGNOSIS — E785 Hyperlipidemia, unspecified: Secondary | ICD-10-CM

## 2017-08-30 DIAGNOSIS — N170 Acute kidney failure with tubular necrosis: Secondary | ICD-10-CM

## 2017-08-30 DIAGNOSIS — I5042 Chronic combined systolic (congestive) and diastolic (congestive) heart failure: Secondary | ICD-10-CM | POA: Diagnosis present

## 2017-08-30 DIAGNOSIS — I4891 Unspecified atrial fibrillation: Secondary | ICD-10-CM

## 2017-08-30 DIAGNOSIS — R748 Abnormal levels of other serum enzymes: Secondary | ICD-10-CM

## 2017-08-30 DIAGNOSIS — N179 Acute kidney failure, unspecified: Secondary | ICD-10-CM

## 2017-08-30 DIAGNOSIS — I4892 Unspecified atrial flutter: Secondary | ICD-10-CM | POA: Clinically undetermined

## 2017-08-30 HISTORY — PX: CARDIOVERSION: SHX1299

## 2017-08-30 HISTORY — PX: TEE WITHOUT CARDIOVERSION: SHX5443

## 2017-08-30 LAB — COMPREHENSIVE METABOLIC PANEL
ALK PHOS: 98 U/L (ref 38–126)
ALT: 327 U/L — ABNORMAL HIGH (ref 17–63)
ANION GAP: 16 — AB (ref 5–15)
AST: 300 U/L — ABNORMAL HIGH (ref 15–41)
Albumin: 3.4 g/dL — ABNORMAL LOW (ref 3.5–5.0)
BUN: 84 mg/dL — ABNORMAL HIGH (ref 6–20)
CALCIUM: 8.8 mg/dL — AB (ref 8.9–10.3)
CO2: 19 mmol/L — ABNORMAL LOW (ref 22–32)
Chloride: 107 mmol/L (ref 101–111)
Creatinine, Ser: 2.96 mg/dL — ABNORMAL HIGH (ref 0.61–1.24)
GFR, EST AFRICAN AMERICAN: 23 mL/min — AB (ref >60)
GFR, EST NON AFRICAN AMERICAN: 20 mL/min — AB (ref >60)
Glucose, Bld: 114 mg/dL — ABNORMAL HIGH (ref 65–99)
POTASSIUM: 5 mmol/L (ref 3.5–5.1)
Sodium: 142 mmol/L (ref 135–145)
TOTAL PROTEIN: 6.5 g/dL (ref 6.5–8.1)
Total Bilirubin: 1.3 mg/dL — ABNORMAL HIGH (ref 0.3–1.2)

## 2017-08-30 LAB — BASIC METABOLIC PANEL
Anion gap: 12 (ref 5–15)
BUN: 79 mg/dL — AB (ref 6–20)
CALCIUM: 9.3 mg/dL (ref 8.9–10.3)
CO2: 26 mmol/L (ref 22–32)
CREATININE: 2.71 mg/dL — AB (ref 0.61–1.24)
Chloride: 101 mmol/L (ref 101–111)
GFR calc Af Amer: 25 mL/min — ABNORMAL LOW (ref >60)
GFR, EST NON AFRICAN AMERICAN: 22 mL/min — AB (ref >60)
GLUCOSE: 134 mg/dL — AB (ref 65–99)
POTASSIUM: 4.4 mmol/L (ref 3.5–5.1)
SODIUM: 139 mmol/L (ref 135–145)

## 2017-08-30 LAB — PROTIME-INR
INR: 1.21
PROTHROMBIN TIME: 15.2 s (ref 11.4–15.2)

## 2017-08-30 LAB — POCT I-STAT 3, ART BLOOD GAS (G3+)
ACID-BASE DEFICIT: 9 mmol/L — AB (ref 0.0–2.0)
Bicarbonate: 18.6 mmol/L — ABNORMAL LOW (ref 20.0–28.0)
O2 Saturation: 93 %
TCO2: 20 mmol/L — ABNORMAL LOW (ref 22–32)
pCO2 arterial: 39.9 mmHg (ref 32.0–48.0)
pH, Arterial: 7.261 — ABNORMAL LOW (ref 7.350–7.450)
pO2, Arterial: 69 mmHg — ABNORMAL LOW (ref 83.0–108.0)

## 2017-08-30 LAB — CBC
HEMATOCRIT: 40.7 % (ref 39.0–52.0)
HEMOGLOBIN: 12.7 g/dL — AB (ref 13.0–17.0)
MCH: 31.5 pg (ref 26.0–34.0)
MCHC: 31.2 g/dL (ref 30.0–36.0)
MCV: 101 fL — ABNORMAL HIGH (ref 78.0–100.0)
Platelets: 358 10*3/uL (ref 150–400)
RBC: 4.03 MIL/uL — AB (ref 4.22–5.81)
RDW: 15.9 % — ABNORMAL HIGH (ref 11.5–15.5)
WBC: 14.2 10*3/uL — ABNORMAL HIGH (ref 4.0–10.5)

## 2017-08-30 LAB — PROCALCITONIN: PROCALCITONIN: 0.12 ng/mL

## 2017-08-30 LAB — TROPONIN I: TROPONIN I: 0.06 ng/mL — AB (ref <0.03)

## 2017-08-30 LAB — GLUCOSE, CAPILLARY
GLUCOSE-CAPILLARY: 152 mg/dL — AB (ref 65–99)
GLUCOSE-CAPILLARY: 79 mg/dL (ref 65–99)

## 2017-08-30 LAB — HEPARIN LEVEL (UNFRACTIONATED): HEPARIN UNFRACTIONATED: 0.42 [IU]/mL (ref 0.30–0.70)

## 2017-08-30 LAB — TSH: TSH: 10.501 u[IU]/mL — ABNORMAL HIGH (ref 0.350–4.500)

## 2017-08-30 SURGERY — ECHOCARDIOGRAM, TRANSESOPHAGEAL
Anesthesia: General

## 2017-08-30 MED ORDER — PHENYLEPHRINE HCL-NACL 10-0.9 MG/250ML-% IV SOLN
0.0000 ug/min | INTRAVENOUS | Status: DC
  Administered 2017-08-30: 150 ug/min via INTRAVENOUS
  Filled 2017-08-30 (×2): qty 250

## 2017-08-30 MED ORDER — BUTAMBEN-TETRACAINE-BENZOCAINE 2-2-14 % EX AERO
INHALATION_SPRAY | CUTANEOUS | Status: DC | PRN
  Administered 2017-08-30: 2 via TOPICAL

## 2017-08-30 MED ORDER — LIDOCAINE HCL (CARDIAC) PF 100 MG/5ML IV SOSY
PREFILLED_SYRINGE | INTRAVENOUS | Status: DC | PRN
  Administered 2017-08-30: 80 mg via INTRATRACHEAL

## 2017-08-30 MED ORDER — PHENYLEPHRINE 8 MG IN D5W 100 ML (0.08MG/ML) PREMIX OPTIME: 30.0000 ug/min | INJECTION | INTRAVENOUS | Status: DC

## 2017-08-30 MED ORDER — INSULIN ASPART 100 UNIT/ML ~~LOC~~ SOLN: 0.0000 [IU] | Freq: Every day | SUBCUTANEOUS | Status: DC

## 2017-08-30 MED ORDER — FUROSEMIDE 10 MG/ML IJ SOLN
20.0000 mg | Freq: Once | INTRAMUSCULAR | Status: AC
  Administered 2017-08-30: 20 mg via INTRAVENOUS
  Filled 2017-08-30: qty 2

## 2017-08-30 MED ORDER — HYDROCORTISONE NA SUCCINATE PF 100 MG IJ SOLR
100.0000 mg | Freq: Three times a day (TID) | INTRAMUSCULAR | Status: DC
  Administered 2017-08-30 – 2017-08-31 (×3): 100 mg via INTRAVENOUS
  Filled 2017-08-30 (×3): qty 2

## 2017-08-30 MED ORDER — SODIUM CHLORIDE 0.9 % IV SOLN
INTRAVENOUS | Status: DC
  Administered 2017-08-30: 13:00:00 via INTRAVENOUS

## 2017-08-30 MED ORDER — EPHEDRINE SULFATE 50 MG/ML IJ SOLN
INTRAMUSCULAR | Status: DC | PRN
  Administered 2017-08-30 (×2): 10 mg via INTRAVENOUS

## 2017-08-30 MED ORDER — PREDNISONE 20 MG PO TABS: 20.0000 mg | ORAL_TABLET | Freq: Every day | ORAL | Status: DC

## 2017-08-30 MED ORDER — PHENYLEPHRINE HCL 10 MG/ML IJ SOLN
INTRAVENOUS | Status: DC | PRN
  Administered 2017-08-30: 15 ug/min via INTRAVENOUS

## 2017-08-30 MED ORDER — FUROSEMIDE 10 MG/ML IJ SOLN: 40.0000 mg | Freq: Two times a day (BID) | INTRAMUSCULAR | Status: DC

## 2017-08-30 MED ORDER — PHENYLEPHRINE HCL 10 MG/ML IJ SOLN
INTRAMUSCULAR | Status: DC | PRN
  Administered 2017-08-30 (×2): 120 ug via INTRAVENOUS

## 2017-08-30 MED ORDER — AZITHROMYCIN 250 MG PO TABS
250.0000 mg | ORAL_TABLET | Freq: Every day | ORAL | Status: AC
  Administered 2017-08-31: 250 mg via ORAL
  Filled 2017-08-30: qty 1

## 2017-08-30 MED ORDER — ONDANSETRON HCL 4 MG/2ML IJ SOLN
INTRAMUSCULAR | Status: DC | PRN
  Administered 2017-08-30: 4 mg via INTRAVENOUS

## 2017-08-30 MED ORDER — SODIUM CHLORIDE 0.9 % IV BOLUS
500.0000 mL | Freq: Once | INTRAVENOUS | Status: AC
  Administered 2017-08-30: 500 mL via INTRAVENOUS

## 2017-08-30 MED ORDER — PROPOFOL 500 MG/50ML IV EMUL
INTRAVENOUS | Status: DC | PRN
  Administered 2017-08-30: 50 ug/kg/min via INTRAVENOUS

## 2017-08-30 MED ORDER — PANTOPRAZOLE SODIUM 40 MG PO TBEC
40.0000 mg | DELAYED_RELEASE_TABLET | Freq: Every day | ORAL | Status: DC
  Administered 2017-08-31 – 2017-09-07 (×8): 40 mg via ORAL
  Filled 2017-08-30 (×8): qty 1

## 2017-08-30 MED ORDER — WARFARIN SODIUM 5 MG PO TABS: 5.0000 mg | ORAL_TABLET | Freq: Once | ORAL | Status: DC

## 2017-08-30 MED ORDER — INSULIN ASPART 100 UNIT/ML ~~LOC~~ SOLN
0.0000 [IU] | Freq: Three times a day (TID) | SUBCUTANEOUS | Status: DC
  Administered 2017-08-31: 3 [IU] via SUBCUTANEOUS
  Administered 2017-08-31 – 2017-09-01 (×2): 2 [IU] via SUBCUTANEOUS
  Administered 2017-09-01 (×2): 1 [IU] via SUBCUTANEOUS
  Administered 2017-09-02: 2 [IU] via SUBCUTANEOUS
  Administered 2017-09-03 – 2017-09-05 (×4): 1 [IU] via SUBCUTANEOUS
  Administered 2017-09-05: 2 [IU] via SUBCUTANEOUS
  Administered 2017-09-06: 1 [IU] via SUBCUTANEOUS

## 2017-08-30 NOTE — Progress Notes (Signed)
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  Subjective:  . For TEE/ cardioversion today. . Renal US with atrophic kidneys, L > R . Breathing improved  06/16 0701 - 06/17 0700 In: 1537.9 [P.O.:1440; I.V.:97.9] Out: 1650 [Urine:1650]  Filed Weights   08/28/17 0600 08/29/17 0520 08/30/17 0543  Weight: 48.3 kg (106 lb 7.7 oz) 46.8 kg (103 lb 3.2 oz) 46.9 kg (103 lb 6.4 oz)    Scheduled Meds: . aspirin  81 mg Oral Daily  . atorvastatin  40 mg Oral Daily  . azithromycin  250 mg Oral Daily  . furosemide  40 mg Intravenous BID  . metoprolol tartrate  50 mg Oral BID  . nicotine  21 mg Transdermal Daily  . predniSONE  40 mg Oral Q breakfast  . warfarin  5 mg Oral ONCE-1800  . Warfarin - Pharmacist Dosing Inpatient   Does not apply q1800   Continuous Infusions: . heparin 700 Units/hr (08/29/17 1749)   PRN Meds:.acetaminophen, colchicine, dextromethorphan-guaiFENesin, hydrALAZINE, ipratropium, levalbuterol, metoprolol tartrate, morphine injection, nitroGLYCERIN, ondansetron (ZOFRAN) IV, zolpidem  Current Labs: reviewed  UA without hematuria, pyuria Protein to creatinine ratio of the urine 0.6  Physical Exam:  Blood pressure 100/74, pulse 75, temperature (!) 97.4 F (36.3 C), temperature source Oral, resp. rate 20, height 5\' 2"  (1.575 m), weight 46.9 kg (103 lb 6.4 oz), SpO2 98 %. GEN: No acute distress ENT: NCAT, JVD to angle of mandible upright EYES: EOMI CV: Tachycardic, irregular, no murmur or rub PULM: Crackles in the bases bilaterally, normal WOB ABD: Soft, nontender, no suprapubic distention or tenderness, bowel sounds present SKIN: heavily tattooed EXT: No edema  A/P 1. AoCKD3; follows at our office (prev Briant Cedar[place]ting[place], no visit since his retirement) SCr was 1.6 11/2016.  Renal US with small, shrunken kidneys, L > R.  Cr 2.7 this AM. 2. SOB, exertional  and progressive, pulmonary edema / COPD / Pleural effusions; new finding of acute systolic heart failure with regional wall motion abnormalities on echocardiogram.  Continue Lasix, agree with decreasing dose to 40 IV BID based on good aiuresis.   3. PNA; on azithromycin, prednisone, bronchodialtors 4. Atrial tachycardia on MTP + Heparin-- for TEE/ cardioversion today   Bufford ButtnerElizabeth Kento Gossman MD [facility] pgr [phone] 08/30/2017, 10:36 AM  Recent Labs  Lab 08/28/17 2238 08/29/17 0601 08/30/17 0438  NA 139 140 139  K 3.9 3.9 4.4  CL 102 104 101  CO2 23 25 26   GLUCOSE 168* 154* 134*  BUN 60* 60* 79*  CREATININE 2.57* 2.46* 2.71*  CALCIUM 9.2 9.3 9.3   Recent Labs  Lab 08/27/17 1555  WBC 10.6*  HGB 12.5*  HCT 39.1  MCV 98.0  PLT 366

## 2017-08-30 NOTE — Progress Notes (Signed)
PROGRESS NOTE  Derrick Marsh  RUE:[id number] DOB: 1945-06-13 DOA: 08/27/2017 PCP: Renford Dills, MD  Outpatient Specialists: [facility] Brief Narrative: Derrick Marsh is a 72 y.o. male with a history of CAD s/p CABG 2000, PAD, HTN, HLD, COPD not on home oxygen and tobacco use (quit a couple weeks ago) who presented to the ED with shortness of breath for a few weeks worsening initially on exertion, now also at rest not improved by OTC cold/cough preparations including mucinex-D. He presented on the advice of his PCP after abnormal ECG in the office. On arrival troponin 0.09, BNP 3310, creatinine elevated at 2.36 from unknown baseline, WBC 10.6. He was hypoxic and afebrile. ECG demonstrated atypical AFlutter with rapid ventricular response. CT chest demonstrated peripheral-predominant airspace opacities concerning for pulmonary edema and superimposed infection on background of bullous emphysema and bronchiectasis.  Lasix and azithromycin were given, cardiology consulted and pt was admitted.   Assessment & Plan: Principal Problem:   Acute on chronic respiratory failure with hypoxia (HCC) Active Problems:   CAD, multiple vessel -s/p CABG   Hyperlipidemia with target LDL less than 70   Essential hypertension   Tobacco abuse   Acute renal failure superimposed on stage 3 chronic kidney disease (HCC)   COPD with acute exacerbation (HCC)   Lung nodule   Elevated troponin = likley demand ischemia   Dilated cardiomyopathy (HCC)   Acute combined systolic and diastolic heart failure (HCC) -complicated by rapid atrial flutter   Atrial flutter with rapid ventricular response (HCC)  Acute hypoxic respiratory failure: Due to atypical infection, pulmonary edema, and rapid arrhythmia on baseline poor pulmonary function/reserve.  - Continue supplemental oxygen as needed - Treat as below  Atypical atrial flutter: Very difficult to control rate, limited by hypotension. Doubt medications alone  would solve this. - DCCV today.  - Continue metoprolol - Cardiology consulted, started heparin, bridging to coumadin.  Atypical pneumonia: RVP negative.  - Will complete azithromycin tomorrow. Will repeat CXR in next couple days. - Monitor cultures  COPD with acute exacerbation: Pretty significant bullous emphysema on CT and ~60 pack years of smoking.  - Wean steroids over next couple days - Continue scheduled and prn nebs  Acute HFrEF: New Dx. ?ICM vs. arrhythmogenic. LVEF 20-25% w/focal hypokinesis of the basal-mid inferolateral myocardium. Moderately reduced RV systolic function. IVC wnl. Mod pulm HTN.  - Continues to have JVD, 1.65L out yesterday with rise in creatinine. Decrease lasix to 40mg  IV BID.  - Monitor I/O, daily weights, BMP  CAD s/p CABG and mildly elevated troponin, HLD, HTN: Focal WMA on echo. Cardiology with low suspicion of acute coronary syndrome.  - Continue ASA, statin (LDL 79), beta blocker  - HbA1c 6.4%: would benefit from PCP follow up. Not a candidate for metformin  AKI on stage III CKD: Followed by Dr. Briant Cedar prior to retirment with [facility], last Cr in Sept 2018 was 1.55. UPr:Cr 0.62. ?due to obstruction w/LUTS but PVR 160-239ml and now have likely answer in cardiorenal syndrome. Renal U/S with known L atrophy and small right kidney, no hydro.  - Nephrology following - Trend BMP  - Avoid nephrotoxins  Hyperkalemia: Resolved. - Monitor   Pulmonary nodules: Noted on CT chest (see full report) 7mm and 5mm.  - Will need repeat imaging following abx given extensive tobacco history.   Tobacco use: Congratulated on smoking cessation these past couple weeks.  - Nicotine patch ordered  Gout: Chronic, stable.  - Continue colchicine prn - Monitor with  diuresis.  DVT prophylaxis: Heparin Code Status: Full Family Communication: None at bedside Disposition Plan: Home once improved.   Consultants:   Cardiology Nephrology  Procedures:   Echocardiogram  08/28/2017: - Left ventricle: The cavity size was normal. Wall thickness was   normal. Systolic function was severely reduced. The estimated   ejection fraction was in the range of 20% to 25%. Diffuse   hypokinesis. - Regional wall motion abnormality: Severe hypokinesis of the   basal-mid inferolateral myocardium. - Aortic valve: Mildly calcified annulus. Trileaflet; moderately   thickened leaflets. Valve area (VTI): 1.59 cm^2. Valve area   (Vmax): 1.54 cm^2. Valve area (Vmean): 1.63 cm^2. - Mitral valve: Mildly calcified annulus. Mildly thickened leaflets   . There was mild regurgitation. Valve area by pressure half-time:   1.16 cm^2. - Left atrium: The atrium was severely dilated. - Right ventricle: The cavity size was mildly to moderately   dilated. Systolic function was moderately reduced. - Right atrium: The atrium was moderately dilated. - Atrial septum: No defect or patent foramen ovale was identified. - Tricuspid valve: There was mild-moderate regurgitation. - Pulmonary arteries: Systolic pressure was moderately increased.   PA peak pressure: 40 mm Hg (S).  TEE/DCCV 08/30/2017: Pending  Antimicrobials:  Azithromycin 6/14 - 6/18   Subjective: Some dyspnea with minimal exertion continues. No chest pain.   Objective: Vitals:   08/30/17 0952 08/30/17 1141 08/30/17 1148 08/30/17 1225  BP: 100/74 (!) 86/73 100/60 98/73  Pulse: 75 (!) 129  (!) 139  Resp:  20  20  Temp:    97.6 F (36.4 C)  TempSrc:    Oral  SpO2:  100%  99%  Weight:    46.9 kg (103 lb 6.4 oz)  Height:    5\' 2"  (1.575 m)    Intake/Output Summary (Last 24 hours) at 08/30/2017 1250 Last data filed at 08/30/2017 0345 Gross per 24 hour  Intake 1057.88 ml  Output 1500 ml  Net -442.12 ml   Filed Weights   08/29/17 0520 08/30/17 0543 08/30/17 1225  Weight: 46.8 kg (103 lb 3.2 oz) 46.9 kg (103 lb 6.4 oz) 46.9 kg (103 lb 6.4 oz)   Gen: 72 y.o. male in no distress Pulm: Nonlabored tachypnea with  supplemental oxygen. Crackles at bases.  CV: Rapid irreg. No murmur, rub, or gallop. + JVD, no dependent edema. GI: Abdomen soft, non-tender, non-distended, with normoactive bowel sounds.  Ext: Warm, no deformities Skin: No rashes, lesions or ulcers on visualized skin.  Neuro: Alert and oriented. No focal neurological deficits. Psych: Judgement and insight appear fair. Mood euthymic & affect congruent. Behavior is appropriate.    Data Reviewed: I have personally reviewed following labs and imaging studies  CBC: Recent Labs  Lab 08/27/17 1555  WBC 10.6*  HGB 12.5*  HCT 39.1  MCV 98.0  PLT 366   Basic Metabolic Panel: Recent Labs  Lab 08/27/17 1555 08/28/17 1513 08/28/17 2238 08/29/17 0601 08/30/17 0438  NA 138 140 139 140 139  K 4.9 5.4* 3.9 3.9 4.4  CL 106 104 102 104 101  CO2 21* 25 23 25 26   GLUCOSE 108* 184* 168* 154* 134*  BUN 40* 54* 60* 60* 79*  CREATININE 2.36* 2.64* 2.57* 2.46* 2.71*  CALCIUM 9.8 9.3 9.2 9.3 9.3   GFR: Estimated Creatinine Clearance: 16.3 mL/min (A) (by C-G formula based on SCr of 2.71 mg/dL (H)). Liver Function Tests: No results for input(s): AST, ALT, ALKPHOS, BILITOT, PROT, ALBUMIN in the last 168 hours. No results  for input(s): LIPASE, AMYLASE in the last 168 hours. No results for input(s): AMMONIA in the last 168 hours. Coagulation Profile: Recent Labs  Lab 08/29/17 1305 08/30/17 0438  INR 1.16 1.21   Cardiac Enzymes: Recent Labs  Lab 08/27/17 2239 08/28/17 0520 08/28/17 1038  TROPONINI 0.06* 0.06* 0.04*   BNP (last 3 results) No results for input(s): PROBNP in the last 8760 hours. HbA1C: Recent Labs    08/28/17 0520  HGBA1C 6.4*   CBG: No results for input(s): GLUCAP in the last 168 hours. Lipid Profile: Recent Labs    08/28/17 0520  CHOL 119  HDL 32*  LDLCALC 79  TRIG 41  CHOLHDL 3.7   Thyroid Function Tests: No results for input(s): TSH, T4TOTAL, FREET4, T3FREE, THYROIDAB in the last 72 hours. Anemia  Panel: No results for input(s): VITAMINB12, FOLATE, FERRITIN, TIBC, IRON, RETICCTPCT in the last 72 hours. Urine analysis:    Component Value Date/Time   COLORURINE YELLOW 08/28/2017 1412   APPEARANCEUR CLEAR 08/28/2017 1412   LABSPEC 1.013 08/28/2017 1412   PHURINE 5.0 08/28/2017 1412   GLUCOSEU NEGATIVE 08/28/2017 1412   HGBUR NEGATIVE 08/28/2017 1412   BILIRUBINUR NEGATIVE 08/28/2017 1412   KETONESUR NEGATIVE 08/28/2017 1412   PROTEINUR 30 (A) 08/28/2017 1412   NITRITE NEGATIVE 08/28/2017 1412   LEUKOCYTESUR NEGATIVE 08/28/2017 1412   Recent Results (from the past 240 hour(s))  Culture, blood (routine x 2) Call MD if unable to obtain prior to antibiotics being given     Status: None (Preliminary result)   Collection Time: 08/27/17 10:45 PM  Result Value Ref Range Status   Specimen Description BLOOD RIGHT HAND  Final   Special Requests   Final    BOTTLES DRAWN AEROBIC AND ANAEROBIC Blood Culture adequate volume   Culture   Final    NO GROWTH 1 DAY Performed at [facility], [address] [place], [place] [address]    Report Status PENDING  Incomplete  Respiratory Panel by PCR     Status: None   Collection Time: 08/27/17 10:50 PM  Result Value Ref Range Status   Adenovirus NOT DETECTED NOT DETECTED Final   Coronavirus 229E NOT DETECTED NOT DETECTED Final   Coronavirus HKU1 NOT DETECTED NOT DETECTED Final   Coronavirus NL63 NOT DETECTED NOT DETECTED Final   Coronavirus OC43 NOT DETECTED NOT DETECTED Final   Metapneumovirus NOT DETECTED NOT DETECTED Final   Rhinovirus / Enterovirus NOT DETECTED NOT DETECTED Final   Influenza A NOT DETECTED NOT DETECTED Final   Influenza B NOT DETECTED NOT DETECTED Final   Parainfluenza Virus 1 NOT DETECTED NOT DETECTED Final   Parainfluenza Virus 2 NOT DETECTED NOT DETECTED Final   Parainfluenza Virus 3 NOT DETECTED NOT DETECTED Final   Parainfluenza Virus 4 NOT DETECTED NOT DETECTED Final   Respiratory Syncytial Virus NOT DETECTED  NOT DETECTED Final   Bordetella pertussis NOT DETECTED NOT DETECTED Final   Chlamydophila pneumoniae NOT DETECTED NOT DETECTED Final   Mycoplasma pneumoniae NOT DETECTED NOT DETECTED Final  Culture, blood (routine x 2) Call MD if unable to obtain prior to antibiotics being given     Status: None (Preliminary result)   Collection Time: 08/27/17 11:00 PM  Result Value Ref Range Status   Specimen Description BLOOD RIGHT ANTECUBITAL  Final   Special Requests   Final    BOTTLES DRAWN AEROBIC AND ANAEROBIC Blood Culture results may not be optimal due to an excessive volume of blood received in culture bottles  Culture   Final    NO GROWTH 1 DAY Performed at [facility], [address] [place], [place] [address]    Report Status PENDING  Incomplete      Radiology Studies: [place] Renal  Result Date: 08/29/2017 CLINICAL DATA:  Elevated serum creatinine.  LEFT renal atrophy. EXAM: RENAL / URINARY TRACT ULTRASOUND COMPLETE COMPARISON:  Renal ultrasound dated 09/09/2015. FINDINGS: Right Kidney: Length: 8.5 cm. Echogenicity within normal limits. No mass or hydronephrosis visualized. Left Kidney: Length: 6.3 cm. LEFT renal cortex is diffusely echogenic. No mass or hydronephrosis visualized. Bladder: Decompressed limiting characterization, but unremarkable. IMPRESSION: 1. RIGHT kidney is unremarkable. 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 No hydronephrosis. 2. LEFT kidney is atrophic with diffusely echogenic cortex indicating chronic medical renal disease. No acute findings. No hydronephrosis. 3. Spleen is somewhat heterogeneous in appearance, of uncertain significance. Electronically Signed   By: Bary RichardStan  Maynard M.D.   On: 08/29/2017 15:36    Scheduled Meds: . [MAR Hold] aspirin  81 mg Oral Daily  . [MAR Hold] atorvastatin  40 mg Oral Daily  . [MAR Hold] azithromycin  250 mg Oral Daily    . [MAR Hold] furosemide  40 mg Intravenous BID  . [MAR Hold] metoprolol tartrate  50 mg Oral BID  . [MAR Hold] nicotine  21 mg Transdermal Daily  . [MAR Hold] predniSONE  40 mg Oral Q breakfast  . [MAR Hold] warfarin  5 mg Oral ONCE-1800  . [MAR Hold] Warfarin - Pharmacist Dosing Inpatient   Does not apply q1800   Continuous Infusions: . sodium chloride    . heparin 700 Units/hr (08/29/17 1749)     LOS: 3 days   Time spent: 35 minutes.  Tyrone Nineyan B Draven Laine, MD [facility] [email or web address] Password [email or web address] 08/30/2017, 12:50 PM

## 2017-08-30 NOTE — Progress Notes (Signed)
Dr. Noreene LarssonJoslin to room to assess pt.  Unable to titrate enosynephrine gtt, see VSS flowsheet. Pt continues in trendelenburg.  Alert and oriented.  Pt c/o needing to urinate, unable to void more then approximately 20 cc.  Continues to states feels like needs to void.  Per Dr. Noreene LarssonJoslin continue to  titrate neo up as needed, will need  ICU bed.

## 2017-08-30 NOTE — Progress Notes (Signed)
eLink Physician-Brief Progress Note Patient Name: Derrick Marsh DOB: 1946-01-05 MRN: [id number]   Date of Service  08/30/2017  HPI/Events of Note  Hypotension - BP = 85/48 with MAP = 60. LVEF = 20% to 25%. Fingers and toes turned blue by report on Phenylephrine IV infusion earlier today.   eICU Interventions  Will bolus with 0.9 NaCl 500 mL IV over 30 minutes.      Intervention Category Major Interventions: Hypotension - evaluation and management  Derrick Marsh,Derrick Marsh 08/30/2017, 9:33 PM

## 2017-08-30 NOTE — Anesthesia Preprocedure Evaluation (Addendum)
Anesthesia Evaluation  Patient identified by MRN, date of birth, ID band Patient awake    Reviewed: Allergy & Precautions, NPO status , Patient's Chart, lab work & pertinent test results  Airway Mallampati: II  TM Distance: >3 FB Neck ROM: Full    Dental  (+) Edentulous Upper, Partial Lower, Dental Advisory Given   Pulmonary Current Smoker,    breath sounds clear to auscultation       Cardiovascular hypertension,  Rhythm:Irregular Rate:Tachycardia     Neuro/Psych    GI/Hepatic   Endo/Other    Renal/GU      Musculoskeletal   Abdominal   Peds  Hematology   Anesthesia Other Findings   Reproductive/Obstetrics                            Anesthesia Physical Anesthesia Plan  ASA: III  Anesthesia Plan: MAC   Post-op Pain Management:    Induction: Intravenous  PONV Risk Score and Plan: 1 and Ondansetron and Dexamethasone  Airway Management Planned: Natural Airway and Nasal Cannula  Additional Equipment:   Intra-op Plan:   Post-operative Plan:   Informed Consent: I have reviewed the patients History and Physical, chart, labs and discussed the procedure including the risks, benefits and alternatives for the proposed anesthesia with the patient or authorized representative who has indicated his/her understanding and acceptance.   Dental advisory given  Plan Discussed with: CRNA and Anesthesiologist  Anesthesia Plan Comments:        Anesthesia Quick Evaluation

## 2017-08-30 NOTE — Progress Notes (Signed)
Patient transferred to 2H24 from Endo. Personal belongings, wallet, eyeglasses,dentures, piercings, clothes ,shoes was endorsed to [employer] Rn.

## 2017-08-30 NOTE — Progress Notes (Signed)
   08/30/17 0335  Vitals  ECG Heart Rate (!) 155  Cardiac Rhythm Atrial fibrillation  Pt's HR sustained in the 150's, pt resting comfortably, asymptomatic. Metoprolol 5mg  IV given. Will continue to monitor.

## 2017-08-30 NOTE — Progress Notes (Signed)
Pt arrived to Unit at 1600. Skin color purple/blue, lying flat. Per Endo nurse report this coloring was not normal for the pt. Upon sitting up in stretcher and increase in O2 color improved. Fingers, ears, and tongue cyanotic. Lung sounds coarse crackles throughout. Unable to obtain an appropriate O2 Sat.   Dr. Sherryll BurgerShah present upon arrival. Pt with urgency to urinate, unable to urinate. Pt states "It feels like I have a stricture". Bladder scan performed by x2 RN, results 0cc present. Bilateral uretal hernias noted upon visualization of pelvis. Pt states he has had them for "some time". Verbal order from Dr. Sherryll BurgerShah to place foley catheter.   IV team contacted for PICC placement r/t Neo gtt, Heparin gtt, and difficult stick. Approval needed from Nephrology. Nephrologist paged with no response.   Foley placed with 2 RN at bedside. Urine return present. Total output 2cc.   Neo gtt in process of being weaned for MAP >60 and SBP>90.  At 1830 Pt laid flat to remove off of bedpan and for perineal care. Skin color became cyanotic in appearance. Pt repositioned with HOB at 60 degrees, and deep breathing instructed. Color improved. Fingers, toes, tongue, and ears cyanotic.  Dr. Sherryll BurgerShah paged with response. Neo gtt turned off per verbal order. Stat Chest Xray, and ABG ordered.   Post Neo gtt being stopped color returned to fingers, toes, and ears. Tongue continues to be cyanotic in appearance.   Pt now converting into A-Fib, and back into NSR regularly.   1905 Dr. Sherryll BurgerShah at bedside.   BP Stable. HR stable. Color returned. Lung sounds reassessed, coarse crackles throughout. Continue to be unable to obtain an appropriate O2 sat. Awaiting ABG, respiratory therapist contacted.

## 2017-08-30 NOTE — Progress Notes (Signed)
ANTICOAGULATION CONSULT NOTE  Pharmacy Consult for Heparin and Warfarin Indication: atrial fibrillation  Allergies  Allergen Reactions  . Aleve [Naproxen] Other (See Comments)    Was told by a MD to not take this; conflicted with his other meds    Patient Measurements: Height: 5\' 2"  (157.5 cm) Weight: 103 lb 6.4 oz (46.9 kg) IBW/kg (Calculated) : 54.6 Heparin Dosing Weight: 48.3 kg  Vital Signs: Temp: 97.4 F (36.3 C) (06/17 0738) Temp Source: Oral (06/17 0738) BP: 97/69 (06/17 0738) Pulse Rate: 76 (06/17 0738)  Labs: Recent Labs    08/27/17 1555 08/27/17 2239 08/28/17 0520 08/28/17 1038  08/28/17 2238 08/29/17 0601 08/29/17 1305 08/30/17 0438  HGB 12.5*  --   --   --   --   --   --   --   --   HCT 39.1  --   --   --   --   --   --   --   --   PLT 366  --   --   --   --   --   --   --   --   LABPROT  --   --   --   --   --   --   --  14.7 15.2  INR  --   --   --   --   --   --   --  1.16 1.21  HEPARINUNFRC  --   --   --   --   --  0.41 0.52  --  0.42  CREATININE 2.36*  --   --   --    < > 2.57* 2.46*  --  2.71*  TROPONINI  --  0.06* 0.06* 0.04*  --   --   --   --   --    < > = values in this interval not displayed.    Estimated Creatinine Clearance: 16.3 mL/min (A) (by C-G formula based on SCr of 2.71 mg/dL (H)).   Medical History: Past Medical History:  Diagnosis Date  . Allergic rhinitis   . Atherosclerosis of native arteries of the extremities with intermittent claudication   . 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 HTN (hypertension)   . Hypercholesteremia   . PVC's (premature ventricular contractions)   . Tobacco abuse     Assessment: 72 yo with PMH CAD w/ CABG (2000) p/w tachycardia and dyspnea. Pharmacy consulted to dose heparin/warfarin for afib. CBC low stable with no signs/symptoms of bleeding. Per MD continue heparin until after TEE/DCCV and until INR  therapeutic. Good PO intake but NPO after midnight for possible DCCV today, DDI noted with warfarin and azithromycin. INR SUBtherapeutic at 1.21, as expected, today. Heparin level remains therapeutic this morning at 0.42. No bleeding noted   Goal of Therapy:  Heparin level 0.3-0.7 units/ml Monitor platelets by anticoagulation protocol: Yes   Plan:  Continue heparin gtt 700 units/hr Warfarin 5 mg PO x 1 Daily INR, CBCs, heparin level F/u s/sx bleeding, PO intake, DDI F/u DCCV 6/17   Thank you for allowing us to participate in this patients care.  Signe Coltonya C Rama Sorci, PharmD Clinical phone for 08/30/2017 from 7a-3:30p: x [phone]  08/30/2017 8:53 AM

## 2017-08-30 NOTE — Anesthesia Procedure Notes (Signed)
Procedure Name: Intubation Date/Time: 08/30/2017 1:32 PM Performed by: Glynda Jaeger, CRNA Pre-anesthesia Checklist: Patient identified, Patient being monitored, Timeout performed, Emergency Drugs available and Suction available Patient Re-evaluated:Patient Re-evaluated prior to induction Oxygen Delivery Method: Circle System Utilized Preoxygenation: Pre-oxygenation with 100% oxygen Induction Type: IV induction Ventilation: Mask ventilation without difficulty Laryngoscope Size: Mac and 3 Grade View: Grade I Tube type: Oral Tube size: 7.5 mm Number of attempts: 1 Airway Equipment and Method: Stylet Placement Confirmation: ETT inserted through vocal cords under direct vision,  positive ETCO2 and breath sounds checked- equal and bilateral Secured at: 21 cm Tube secured with: Tape Dental Injury: Teeth and Oropharynx as per pre-operative assessment

## 2017-08-30 NOTE — Progress Notes (Signed)
  Echocardiogram Echocardiogram Transesophageal has been performed.  Derrick SavoyCasey N Jemiah Ellenburg 08/30/2017, 2:09 PM

## 2017-08-30 NOTE — Transfer of Care (Signed)
Immediate Anesthesia Transfer of Care Note  Patient: Derrick Marsh  Procedure(s) Performed: TRANSESOPHAGEAL ECHOCARDIOGRAM (TEE) (N/A ) CARDIOVERSION (N/A )  Patient Location: Endoscopy Unit  Anesthesia Type:General  Level of Consciousness: awake, alert , oriented, patient cooperative and responds to stimulation  Airway & Oxygen Therapy: Patient Spontanous Breathing and Patient connected to face mask oxygen  Post-op Assessment: Report given to RN, Post -op Vital signs reviewed and stable and Patient moving all extremities X 4  Post vital signs: Reviewed and stable  Last Vitals:  Vitals Value Taken Time  BP 99/59 08/30/2017  2:27 PM  Temp    Pulse 119 08/30/2017  2:28 PM  Resp 17 08/30/2017  2:29 PM  SpO2 94 % 08/30/2017  2:28 PM  Vitals shown include unvalidated device data.  Last Pain:  Vitals:   08/30/17 1427  TempSrc: Oral  PainSc: 0-No pain         Complications: No apparent anesthesia complications

## 2017-08-30 NOTE — Progress Notes (Signed)
Received patient to recovery with phenylephrine gtt at 150 mcg/min. See VS.  Pt alert and oriented.  Placed in trendelburg.  Continue to monitor and attempt to titrate off.

## 2017-08-30 NOTE — Progress Notes (Signed)
Progress Note  Patient Name: Derrick Marsh Date of Encounter: 08/30/2017  Primary Cardiologist: No primary care provider on file.   Subjective   Overall breathing better.  No real orthopnea. Still wearing oxygen, not very much aware of his rate being very fast.  Inpatient Medications    Scheduled Meds: . aspirin  81 mg Oral Daily  . atorvastatin  40 mg Oral Daily  . azithromycin  250 mg Oral Daily  . furosemide  40 mg Intravenous TID  . metoprolol tartrate  50 mg Oral BID  . nicotine  21 mg Transdermal Daily  . predniSONE  40 mg Oral Q breakfast  . warfarin  5 mg Oral ONCE-1800  . Warfarin - Pharmacist Dosing Inpatient   Does not apply q1800   Continuous Infusions: . heparin 700 Units/hr (08/29/17 1749)   PRN Meds: acetaminophen, colchicine, dextromethorphan-guaiFENesin, hydrALAZINE, ipratropium, levalbuterol, metoprolol tartrate, morphine injection, nitroGLYCERIN, ondansetron (ZOFRAN) IV, zolpidem   Vital Signs    Vitals:   08/29/17 2000 08/30/17 0013 08/30/17 0543 08/30/17 0738  BP: 109/73 109/66 111/77 97/69  Pulse: (!) 131 (!) 121 (!) 132 76  Resp: 20 18 20    Temp: (!) 97.5 F (36.4 C) 97.6 F (36.4 C) (!) 97.5 F (36.4 C) (!) 97.4 F (36.3 C)  TempSrc: Oral Oral Oral Oral  SpO2: 97% 98% 100% (!) 79%  Weight:   103 lb 6.4 oz (46.9 kg)   Height:        Intake/Output Summary (Last 24 hours) at 08/30/2017 1610 Last data filed at 08/30/2017 0345 Gross per 24 hour  Intake 1177.88 ml  Output 1650 ml  Net -472.12 ml   Filed Weights   08/28/17 0600 08/29/17 0520 08/30/17 0543  Weight: 106 lb 7.7 oz (48.3 kg) 103 lb 3.2 oz (46.8 kg) 103 lb 6.4 oz (46.9 kg)    Telemetry    Atrial flutter with rapid response rates in 120 to 150 bpm.- Personally Reviewed  ECG    Atrial flutter rate 120 bpm.- Personally Reviewed  Physical Exam   Physical Exam  Constitutional: He is oriented to person, place, and time. No distress.  Thin, frail, diffuse tattoos.  No  acute distress.  HENT:  Head: Normocephalic and atraumatic.  Neck: Normal range of motion. Neck supple. JVD (To earlobe bilaterally. -Partially related to [facility] A waves from atrial flutter) present.  Cardiovascular: Normal heart sounds and intact distal pulses. A regularly irregular rhythm present. Tachycardia present. PMI is displaced (Mildly laterally displaced). Exam reveals no gallop and no friction rub.  No murmur heard. Pulmonary/Chest: Effort normal. No respiratory distress. He has no wheezes. He exhibits no tenderness.  Mild interstitial breath sounds, but otherwise no rales or rhonchi  Abdominal: Soft. Bowel sounds are normal. He exhibits no distension. There is no tenderness. There is no rebound.  Musculoskeletal: Normal range of motion. He exhibits no edema.  Neurological: He is alert and oriented to person, place, and time.  Skin: He is not diaphoretic.  Psychiatric: He has a normal mood and affect. His behavior is normal. Judgment and thought content normal.  Vitals reviewed.   Labs    Chemistry Recent Labs  Lab 08/28/17 2238 08/29/17 0601 08/30/17 0438  NA 139 140 139  K 3.9 3.9 4.4  CL 102 104 101  CO2 23 25 26   GLUCOSE 168* 154* 134*  BUN 60* 60* 79*  CREATININE 2.57* 2.46* 2.71*  CALCIUM 9.2 9.3 9.3  GFRNONAA 23* 25* 22*  GFRAA 27* 29*  25*  ANIONGAP 14 11 12      Hematology Recent Labs  Lab 08/27/17 1555  WBC 10.6*  RBC 3.99*  HGB 12.5*  HCT 39.1  MCV 98.0  MCH 31.3  MCHC 32.0  RDW 15.6*  PLT 366    Cardiac Enzymes Recent Labs  Lab 08/27/17 2239 08/28/17 0520 08/28/17 1038  TROPONINI 0.06* 0.06* 0.04*    Recent Labs  Lab 08/27/17 1626  TROPIPOC 0.09*     BNP Recent Labs  Lab 08/27/17 2239  BNP 3,310.3*     DDimer No results for input(s): DDIMER in the last 168 hours.   Radiology    US Renal  Result Date: 08/29/2017 CLINICAL DATA:  Elevated serum creatinine.  LEFT renal atrophy. EXAM: RENAL / URINARY TRACT ULTRASOUND  COMPLETE COMPARISON:  Renal ultrasound dated 09/09/2015. FINDINGS: Right Kidney: Length: 8.5 cm. Echogenicity within normal limits. No mass or hydronephrosis visualized. Left Kidney: Length: 6.3 cm. LEFT renal cortex is diffusely echogenic. No mass or hydronephrosis visualized. Bladder: Decompressed limiting characterization, but unremarkable. IMPRESSION: 1. RIGHT kidney is unremarkable. 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 No hydronephrosis. 2. LEFT kidney is atrophic with diffusely echogenic cortex indicating chronic medical renal disease. No acute findings. No hydronephrosis. 3. Spleen is somewhat heterogeneous in appearance, of uncertain significance. Electronically Signed   By: Bary Richard M.D.   On: 08/29/2017 15:36    Cardiac Studies   Echo 08/28/2017: EF 20-25% with diffuse HK.  Aortic sclerosis with no suggestion of stenosis.  Mild to moderate RV dilation with moderate dysfunction.  Moderate RA dilation.  Severe LA dilation.  PA pressure 40 mmHg.  Patient Profile     72 y.o. male with a past medical history significant forCAD with CABG (in 2000),COPD,hypertension, hyperlipidemia,andtobacco abuse.  We were asked to see him secondary to tachycardia and dyspnea --> he was noted to be in atrial flutter with rapid ventricular rate.  Was started on IV heparin with warfarin. Also noted to have dilated cardiomyopathy of unclear etiology (ischemic versus rate related) with severely reduced EF.  He was noted to have acute combined heart failure -on IV Lasix.   Assessment & Plan    Principal Problem:   Acute on chronic respiratory failure with hypoxia (HCC) Active Problems:   CAD, multiple vessel -s/p CABG   Elevated troponin = likley demand ischemia   Dilated cardiomyopathy (HCC)   Acute combined systolic and diastolic heart failure (HCC) -complicated by rapid atrial  flutter   Atrial flutter with rapid ventricular response (HCC)   Hyperlipidemia with target LDL less than 70   Essential hypertension   Tobacco abuse   Acute renal failure superimposed on stage 3 chronic kidney disease (HCC)   COPD with acute exacerbation (HCC)   Lung nodule     Acute on chronic respiratory failure with hypoxia (HCC) - in part related to Acute combined systolic and diastolic heart failure (HCC) -complicated by rapid atrial flutter  On IV Lasix with good urine output, however creatinine slightly increased today with higher BUN.    We will back down on IV Lasix today from 3 times daily to twice daily. --  Has significant JVD with HJR, but complicated by atrial flutter, reassess in the morning post cardioversion..    CAD, multiple vessel -s/p CABG;   Elevated troponin = likley demand ischemia  Minimal troponin elevation is likely related to demand ischemia not ACS.  Continue beta-blocker  and, aspirin and statin.  History of CABG, but no further reports since  No chest pain despite heart rates in the 150s.  Essentially negative stress test.    Dilated cardiomyopathy (HCC) --echo shows EF 20-25%.  Unsure of etiology.  Unfortunately we do not have any data on EF-from stress test or from echocardiogram since CABG in 2000.      Atrial flutter with rapid ventricular response (HCC) -  Started on IV heparin with warfarin with plans for cardioversion.  --Plan TEE cardioversion today at 1 PM.  Warfarin chosen over DOAC because of CKD 3-4  On metoprolol 50 mg twice daily (avoiding diltiazem because of reduced EF) -- Borderline blood pressures precludes the use of higher dose beta-blocker  May consider outpatient EP consultation to consider possibility of ablation  If unsuccessful and cardioversion, would have low threshold for using amiodarone for short-term     Hyperlipidemia with target LDL less than 70 -on statin.  Stable dose   Essential hypertension -borderline  pressures today on home dose beta-blocker.   Tobacco abuse -counseling recommended; nicotine patch in place      Acute renal failure superimposed on stage 3 chronic kidney disease (HCC)   -hold off on ACE inhibitor/ARB until normalized and blood pressure stable.  Creatinine slightly worse today, may need to back off on IV Lasix for now -change to twice daily from 3 times daily IV Lasix     COPD with acute exacerbation [facility](HCC) -Per hospitalist team  For questions or updates, please contact [facility] Please consult [email or web address] for contact info under Cardiology/STEMI.    Bryan Lemmaavid Aniela Caniglia, MD  08/30/2017, 9:07 AM

## 2017-08-30 NOTE — CV Procedure (Signed)
TEE/CARDIOVERSION NOTE  TRANSESOPHAGEAL ECHOCARDIOGRAM (TEE):  Indictation: Atrial Flutter and systolic congestive heart failure  Consent:   Informed consent was obtained prior to the procedure. The risks, benefits and alternatives for the procedure were discussed and the patient comprehended these risks.  Risks include, but are not limited to, cough, sore throat, vomiting, nausea, somnolence, esophageal and stomach trauma or perforation, bleeding, low blood pressure, aspiration, pneumonia, infection, trauma to the teeth and death.    Time Out: Verified patient identification, verified procedure, site/side was marked, verified correct patient position, special equipment/implants available, medications/allergies/relevent history reviewed, required imaging and test results available. Performed  Procedure:  After a procedural time-out, the patient was given propofol per anesthesia for sedation. The patient's heart rate, blood pressure, and oxygen saturation are monitored continuously during the procedure. The oropharynx was anesthetized with 2 topical cetacaine sprays.  The transesophageal probe was inserted, with some degree of difficulty, and multiple views were obtained. Agitated microbubble saline contrast was not administered.  Complications:    Complications: None Patient did tolerate procedure well.  Findings:  1. LEFT VENTRICLE: The left ventricular wall thickness is normal.  The left ventricular cavity is mildly dilated in size. Wall motion is severely hypokinetic.  LVEF is 15-20%.  2. RIGHT VENTRICLE:  The right ventricle is normal in structure and function without any thrombus or masses.    3. LEFT ATRIUM:  The left atrium is severely dilated in size without any thrombus or masses.  There is not spontaneous echo contrast ("smoke") in the left atrium consistent with a low flow state.  4. LEFT ATRIAL APPENDAGE:  The left atrial appendage is free of any thrombus or masses. The  appendage has single lobes. Pulse doppler indicates low flow in the appendage.  5. ATRIAL SEPTUM:  The atrial septum appears intact and is free of thrombus and/or masses.  There is no evidence for interatrial shunting by color doppler.  6. RIGHT ATRIUM:  The right atrium is moderately dilated without any thrombus or masses.  7. MITRAL VALVE:  The mitral valve is normal in structure and function with Moderate regurgitation.  There were no vegetations or stenosis.  8. AORTIC VALVE:  The aortic valve is trileaflet, normal in structure and function with no regurgitation.  There were no vegetations or stenosis  9. TRICUSPID VALVE:  The tricuspid valve is normal in structure and function with Mild regurgitation.  There were no vegetations or stenosis  10.  PULMONIC VALVE:  The pulmonic valve is normal in structure and function with trivial regurgitation.  There were no vegetations or stenosis.   11. AORTIC ARCH, ASCENDING AND DESCENDING AORTA:  There was minimal Myrtis Ser(Katz et. Al, 1992) atherosclerosis of the ascending aorta, aortic arch, or proximal descending aorta.  12. PULMONARY VEINS: Anomalous pulmonary venous return was not noted.  13. PERICARDIUM: The pericardium appeared normal and non-thickened.  There is no pericardial effusion.  CARDIOVERSION:     Second Time Out: Verified patient identification, verified procedure, site/side was marked, verified correct patient position, special equipment/implants available, medications/allergies/relevent history reviewed, required imaging and test results available.  Performed  Procedure:  1. Patient placed on cardiac monitor, pulse oximetry, supplemental oxygen as necessary.  2. Sedation administered per anesthesia 3. Pacer pads placed anterior and posterior chest. 4. Cardioverted 1 time(s).  5. Cardioverted at 150J biphasic.  Complications:  Complications: None Patient did tolerate procedure well.  Impression:  1. No LAA  thrombus 2. Severe LAE, moderate RAE 3. Moderate MR 4. Dilated  LV with severe global hypokinesis 5. LVEF 15-20% 6. Successful DCCV to NSR with PAC's and PVC's after a single 150J biphasic shock  Recommendations:  1. Further management per inpatient cardiology service.  Time Spent Directly with the Patient:  60 minutes   Chrystie Nose, MD, [facility], FACP  [place]  [facility]  Medical Director of the Advanced Lipid Disorders &  Cardiovascular Risk Reduction Clinic Diplomate of the American Board of Clinical Lipidology Attending Cardiologist  Direct Dial: [phone]  Fax: [phone]  Website:  [email or web address] Nicely Keniah Klemmer 08/30/2017, 1:58 PM

## 2017-08-30 NOTE — Progress Notes (Signed)
Received call from  Nursing that patient fingers were turning blue. Patient was on phenylephrine and most likely due to peripheral vasoconstriction. We stopped phenylephrine, Came to look at patient, He is awake and denies any complain ABG ordered still not done CXR ordered and pending Labs drawn pending results  BP is 97/69 with map of 69 off phenylephrine Will monitor  Hold off on central line as on heparin and bp improved Picc line could not be done as they wished clearance from nephrology and attempts by nursing is trying to get in touch with nephro in this regard Monitor in ICU Will sign out to night team.  Roseanne Renoutul Jaunita Mikels Pulmonary Critical Care & Sleep Medicine

## 2017-08-30 NOTE — H&P (Signed)
   INTERVAL PROCEDURE H&P  History and Physical Interval Note:  08/30/2017 1:07 PM  Verlan Friendsony J Zobrist has presented today for their planned procedure. The various methods of treatment have been discussed with the patient and family. After consideration of risks, benefits and other options for treatment, the patient has consented to the procedure.  The patients' outpatient history has been reviewed, patient examined, and no change in status from most recent office note within the past 30 days. I have reviewed the patients' chart and labs and will proceed as planned. Questions were answered to the patient's satisfaction.   Chrystie NoseKenneth C. Hilty, MD, [facility] MichiganFACC, FACP  [place]  [facility]  Medical Director of the Advanced Lipid Disorders &  Cardiovascular Risk Reduction Clinic Diplomate of the American Board of Clinical Lipidology Attending Cardiologist  Direct Dial: [phone]  Fax: [phone]  Website:  [email or web address]  Kenneth C Hilty 08/30/2017, 1:07 PM

## 2017-08-30 NOTE — Consult Note (Addendum)
PULMONARY / CRITICAL CARE MEDICINE   Name: Derrick Marsh MRN: [id number] DOB: 03-07-1946    ADMISSION DATE:  08/27/2017 CONSULTATION DATE:  6/17   REFERRING MD:  Jarvis Newcomer  CHIEF COMPLAINT: Post procedural hypotension  HISTORY OF PRESENT ILLNESS:   This is 72 year old white male with a significant medical history of coronary artery disease, chronic stage pulmonary disease, peripheral artery disease, and tobacco abuse.  He was admitted on 6/14 chief complaint of shortness of breath, decreased exercise tolerance, dry nonproductive cough, increased exertional dyspnea.  Initially called his cardiologist with these complaints, was told to go to his primary care provider and obtain a EKG which he did.  This showed city of atrial fibrillation/atrial flutter he was then advised for further evaluation.  Subsequent evaluation his identified the following issues: Narrow complex tachycardia, atrial fib versus flutter and new acute on chronic renal failure.Chest x-ray was obtained demonstrating bilateral patchy pulmonary infiltrates, follow-up CT chest demonstrates groundglass opacities bilateral peripherally predominant.  Also had concurrent small bilateral pleural effusions which is favored to be pulmonary edema.  Treatment to date has included: Rate control, telemetry monitoring, IV anticoagulation, furosemide, and empiric antibiotics for potential acquired pneumonia.  An echocardiogram was obtained on 6/15 this demonstrates his systolic function to be severely reduced at 20 to 25% with associated diffuse hypokinesis, also had associated left atrium dilation, right ventricular size mildly to moderately dilated, mild tricuspid regurg, mildly elevated pressures.  Slow progression to the course of his hospitalization.  Was undergoing transesophageal echocardiogram on 6/17.  Preprocedural anesthesia included propofol.  He underwent successful DCCV to normal sinus rhythm following TEE.  Procedure completed by brief  respiratory arrest requiring 3 intubation and mechanical ventilation, postprocedure was extubated by anesthesia, but remained intensive.  Therefore pulmonary asked to evaluate.   PAST MEDICAL HISTORY :  He  has a past medical history of Allergic rhinitis, Atherosclerosis of native arteries of the extremities with intermittent claudication, CAD (coronary artery disease) (2000), HTN (hypertension), Hypercholesteremia, PVC's (premature ventricular contractions), and Tobacco abuse.  PAST SURGICAL HISTORY: He  has a past surgical history that includes Coronary artery bypass graft (2000).  Allergies  Allergen Reactions  . Aleve [Naproxen] Other (See Comments)    Was told by a MD to not take this; conflicted with his other meds    No current facility-administered medications on file prior to encounter.    Current Outpatient Medications on File Prior to Encounter  Medication Sig  . aspirin EC 81 MG tablet Take 81 mg by mouth daily.  Marland Kitchen atorvastatin (LIPITOR) 40 MG tablet TAKE 1 TABLET EVERY DAY  . colchicine 0.6 MG tablet Take 0.6 mg by mouth daily as needed (for gout flares).   . metoprolol tartrate (LOPRESSOR) 25 MG tablet TAKE 1/2 TABLET BY MOUTH TWICE DAILY    FAMILY HISTORY:  His indicated that his mother is deceased. He indicated that his father is deceased. He indicated that both of his sisters are alive. He indicated that his brother is alive.   SOCIAL HISTORY: He  reports that he has been smoking cigars.  He has been smoking about 0.50 packs per day. He has never used smokeless tobacco. He reports that he does not drink alcohol or use drugs.  REVIEW OF SYSTEMS:   Currently still somewhat sedated.  Denies shortness of breath or chest chest pain  SUBJECTIVE:  Shortness of breath  VITAL SIGNS: Blood Pressure (Abnormal) 69/35   Pulse 75   Temperature 97.6 F (36.4 C) (Oral)  Respiration 17   Height 5\' 2"  (1.575 m)   Weight 103 lb 6.4 oz (46.9 kg)   Oxygen Saturation 98%    Body Mass Index 18.91 kg/m    HEMODYNAMICS:    VENTILATOR SETTINGS:    INTAKE / OUTPUT: I/O last 3 completed shifts: In: 1698.7 [P.O.:1560; I.V.:138.7] Out: 2585 [Urine:2585]  PHYSICAL EXAMINATION: General: Frail chronically ill-appearing 72 year old male currently awake, oriented to person and situation Neuro: Awake, no focal deficits HEENT: Cephalic atraumatic does have jugular venous distention Cardiovascular: Regular rate and rhythm sinus rhythm Lungs: Scattered rhonchi and rales no accessory use Abdomen: Soft nontender Musculoskeletal: Equal strength and bulk Skin: Warm and dry multiple tattoos no edema  LABS:  BMET Recent Labs  Lab 08/28/17 2238 08/29/17 0601 08/30/17 0438  NA 139 140 139  K 3.9 3.9 4.4  CL 102 104 101  CO2 23 25 26   BUN 60* 60* 79*  CREATININE 2.57* 2.46* 2.71*  GLUCOSE 168* 154* 134*    Electrolytes Recent Labs  Lab 08/28/17 2238 08/29/17 0601 08/30/17 0438  CALCIUM 9.2 9.3 9.3    CBC Recent Labs  Lab 08/27/17 1555  WBC 10.6*  HGB 12.5*  HCT 39.1  PLT 366    Coag's Recent Labs  Lab 08/29/17 1305 08/30/17 0438  INR 1.16 1.21    Sepsis Markers No results for input(s): LATICACIDVEN, PROCALCITON, O2SATVEN in the last 168 hours.  ABG No results for input(s): PHART, PCO2ART, PO2ART in the last 168 hours.  Liver Enzymes No results for input(s): AST, ALT, ALKPHOS, BILITOT, ALBUMIN in the last 168 hours.  Cardiac Enzymes Recent Labs  Lab 08/27/17 2239 08/28/17 0520 08/28/17 1038  TROPONINI 0.06* 0.06* 0.04*    Glucose No results for input(s): GLUCAP in the last 168 hours.  Imaging No results found.   STUDIES:  Echo 6/15: Wall thickness normal.  Estimated EF 20 to 25% with diffuse hypokinesis.  Severe hypokinesis of the basal mid inferior lateral myocardium.  Left atrium severely dilated, right ventricle mildly to moderately dilated, reduced systolic function of RV.  Right atrium moderately dilated.   Pulmonary arteries with estimated peak pulmonary artery pressure 45 mmHg systolic. TEE 6/15: LVEF 15 to 20% with severely hypokinetic wall.  RV with normal structure and no mass or thrombus.  Left atrium severely dilated no thrombus or mass.  Left atrial appendage free of any thrombus or mass.  Left septum free of thrombus in the past.  No evidence of intra-arterial shunting Renal ultrasound 6/16: Right kidney unremarkable.  Left kidney atrophic with diffusely echogenic cortex suggesting chronic medical disease CT chest 6/14: Diffuse groundglass opacities bilateral perihilar predominant.  Bronchiectatic changes.  Current small bilateral pleural effusions.  Changes consistent with emphysema.  Changes consistent with aortic arthrosclerosis.  Pulmonary artery enlargement.  Bilateral pulmonary nodules next 7 mm.  CULTURES: 6/14 respiratory viral panel negative Blood culture 6/14 Strep 6/14: Negative  ANTIBIOTICS: Azithromycin 6/14  SIGNIFICANT EVENTS: Successful cardioversion 6/17  LINES/TUBES:   DISCUSSION: 72-year-old male patient admitted with acute decompensated heart failure with pulmonary edema +/-CAP.  Treated appropriately with IV diuresis, steroids, bronchodilators, empiric antibiotics.  Also in atrial fibrillation with rapid ventricular response and now status post successful cardioversion.  See post procedural hypotension  ASSESSMENT / PLAN:  Postprocedural hypotension. -Suspect this is residual anesthetic and sedation effect (seveoflurane + propofol) having received general anesthesia or TEE. Plan Transfer to the intensive care Titrate Neo-Synephrine systolic blood pressure 100 Keep diuretics for now Continue treatment. Hold further antihypertensives for  now   Acute systolic heart failure EF 15-20% likely exacerbated by atrial fibrillation with rapid ventricular response now status post successful cardioversion on 6/17 Coronary artery disease the vessel.  Complicated by  demand ischemia Plan Continuing IV anticoagulation Continue telemetry monitoring Continue aspirin and statin.  Holding beta-blockade given hypotension will need to resume once hypotension improves Avoid calcium channel blocker given ejection fraction   Acute on chronic hypoxic respiratory failure appears to be pulmonary edema+/- CAP;  Superimposed on underlying chronic obstructive pulmonary disease Tobacco abuse -More likely to label this is pulmonary edema than infection -Still's, but no significant wheezing Plan Wean oxygen Scheduled bronchodilators Wean prednisone  Day number 4 azithromycin We will repeat x-ray in a.m., check procalcitonin.  May be antibiotics Continue to encourage smoking cessation  Pulmonary nodules Plan We will need repeat CT chest in about 6 months   Acute on chronic renal disease.  CKD stage III. By nephrology.  Suspect cardiorenal syndrome. Plan Lasix when blood pressure will allow at reduced dosing Avoid hypotension Dose medications  DVT prophylaxis: IV heparin  SUP: NA  Diet: adv  Activity: advance Disposition : ICU    Simonne Martinet ACNP-BC [facility] Pulmonary/Critical Care Pager # [phone] OR # [phone] if no answer  08/30/2017, 3:31 PM

## 2017-08-30 NOTE — Progress Notes (Signed)
Call placed to Dr. Jarvis NewcomerGrunz to report patient on 15250mcg/min of neosynephrine while in endo recovery from TEE/cardioversion. Advised unable to titrate down. He will places order for transfer to ICU, 2H preferred. Bed request placed.

## 2017-08-31 ENCOUNTER — Inpatient Hospital Stay (HOSPITAL_COMMUNITY): Payer: Medicare Other

## 2017-08-31 DIAGNOSIS — R570 Cardiogenic shock: Secondary | ICD-10-CM

## 2017-08-31 DIAGNOSIS — N179 Acute kidney failure, unspecified: Secondary | ICD-10-CM

## 2017-08-31 LAB — COOXEMETRY PANEL
CARBOXYHEMOGLOBIN: 1 % (ref 0.5–1.5)
Carboxyhemoglobin: 0.8 % (ref 0.5–1.5)
METHEMOGLOBIN: 1.7 % — AB (ref 0.0–1.5)
Methemoglobin: 1.5 % (ref 0.0–1.5)
O2 Saturation: 53 %
O2 Saturation: 46 %
Total hemoglobin: 10 g/dL — ABNORMAL LOW (ref 12.0–16.0)
Total hemoglobin: 9.9 g/dL — ABNORMAL LOW (ref 12.0–16.0)

## 2017-08-31 LAB — BLOOD GAS, ARTERIAL
Acid-base deficit: 5.3 mmol/L — ABNORMAL HIGH (ref 0.0–2.0)
Bicarbonate: 20.5 mmol/L (ref 20.0–28.0)
Drawn by: 40415
O2 Content: 5 L/min
O2 Saturation: 96.2 %
Patient temperature: 97.9
pCO2 arterial: 45.5 mmHg (ref 32.0–48.0)
pH, Arterial: 7.272 — ABNORMAL LOW (ref 7.350–7.450)
pO2, Arterial: 101 mmHg (ref 83.0–108.0)

## 2017-08-31 LAB — COMPREHENSIVE METABOLIC PANEL
ALK PHOS: 93 U/L (ref 38–126)
ALT: 592 U/L — AB (ref 17–63)
ANION GAP: 16 — AB (ref 5–15)
AST: 523 U/L — AB (ref 15–41)
Albumin: 3.4 g/dL — ABNORMAL LOW (ref 3.5–5.0)
BUN: 87 mg/dL — ABNORMAL HIGH (ref 6–20)
CALCIUM: 8.2 mg/dL — AB (ref 8.9–10.3)
CO2: 20 mmol/L — AB (ref 22–32)
Chloride: 103 mmol/L (ref 101–111)
Creatinine, Ser: 3.03 mg/dL — ABNORMAL HIGH (ref 0.61–1.24)
GFR calc Af Amer: 22 mL/min — ABNORMAL LOW (ref >60)
GFR calc non Af Amer: 19 mL/min — ABNORMAL LOW (ref >60)
Glucose, Bld: 133 mg/dL — ABNORMAL HIGH (ref 65–99)
Potassium: 4.4 mmol/L (ref 3.5–5.1)
SODIUM: 139 mmol/L (ref 135–145)
Total Bilirubin: 1.1 mg/dL (ref 0.3–1.2)
Total Protein: 5.9 g/dL — ABNORMAL LOW (ref 6.5–8.1)

## 2017-08-31 LAB — CBC WITH DIFFERENTIAL/PLATELET
Abs Immature Granulocytes: 0.1 10*3/uL (ref 0.0–0.1)
BASOS ABS: 0 10*3/uL (ref 0.0–0.1)
Basophils Relative: 0 %
EOS ABS: 0 10*3/uL (ref 0.0–0.7)
EOS PCT: 0 %
HCT: 32.8 % — ABNORMAL LOW (ref 39.0–52.0)
Hemoglobin: 10.4 g/dL — ABNORMAL LOW (ref 13.0–17.0)
Immature Granulocytes: 1 %
Lymphocytes Relative: 5 %
Lymphs Abs: 0.7 10*3/uL (ref 0.7–4.0)
MCH: 31.4 pg (ref 26.0–34.0)
MCHC: 31.7 g/dL (ref 30.0–36.0)
MCV: 99.1 fL (ref 78.0–100.0)
Monocytes Absolute: 1.1 10*3/uL — ABNORMAL HIGH (ref 0.1–1.0)
Monocytes Relative: 7 %
NEUTROS PCT: 87 %
Neutro Abs: 13.2 10*3/uL — ABNORMAL HIGH (ref 1.7–7.7)
PLATELETS: 265 10*3/uL (ref 150–400)
RBC: 3.31 MIL/uL — AB (ref 4.22–5.81)
RDW: 15.2 % (ref 11.5–15.5)
WBC: 15.1 10*3/uL — ABNORMAL HIGH (ref 4.0–10.5)

## 2017-08-31 LAB — BRAIN NATRIURETIC PEPTIDE: B NATRIURETIC PEPTIDE 5: 1505.7 pg/mL — AB (ref 0.0–100.0)

## 2017-08-31 LAB — GLUCOSE, CAPILLARY
GLUCOSE-CAPILLARY: 155 mg/dL — AB (ref 65–99)
Glucose-Capillary: 120 mg/dL — ABNORMAL HIGH (ref 65–99)
Glucose-Capillary: 161 mg/dL — ABNORMAL HIGH (ref 65–99)
Glucose-Capillary: 223 mg/dL — ABNORMAL HIGH (ref 65–99)

## 2017-08-31 LAB — T4, FREE: FREE T4: 1.05 ng/dL (ref 0.82–1.77)

## 2017-08-31 LAB — MAGNESIUM: Magnesium: 2.2 mg/dL (ref 1.7–2.4)

## 2017-08-31 LAB — PROTIME-INR
INR: 1.57
Prothrombin Time: 18.6 seconds — ABNORMAL HIGH (ref 11.4–15.2)

## 2017-08-31 LAB — APTT: aPTT: 89 seconds — ABNORMAL HIGH (ref 24–36)

## 2017-08-31 LAB — PROCALCITONIN: Procalcitonin: 1.9 ng/mL

## 2017-08-31 LAB — HEPARIN LEVEL (UNFRACTIONATED): Heparin Unfractionated: 0.3 IU/mL (ref 0.30–0.70)

## 2017-08-31 MED ORDER — FUROSEMIDE 10 MG/ML IJ SOLN
80.0000 mg | Freq: Two times a day (BID) | INTRAMUSCULAR | Status: DC
  Filled 2017-08-31: qty 8

## 2017-08-31 MED ORDER — FUROSEMIDE 10 MG/ML IJ SOLN
80.0000 mg | Freq: Once | INTRAMUSCULAR | Status: AC
  Administered 2017-08-31: 80 mg via INTRAVENOUS
  Filled 2017-08-31: qty 8

## 2017-08-31 MED ORDER — AMIODARONE HCL 200 MG PO TABS: 400.0000 mg | ORAL_TABLET | Freq: Three times a day (TID) | ORAL | Status: DC

## 2017-08-31 MED ORDER — AMIODARONE IV BOLUS ONLY 150 MG/100ML
150.0000 mg | Freq: Once | INTRAVENOUS | Status: DC
  Administered 2017-08-31: 150 mg via INTRAVENOUS

## 2017-08-31 MED ORDER — SODIUM CHLORIDE 0.9% FLUSH
10.0000 mL | Freq: Two times a day (BID) | INTRAVENOUS | Status: DC
  Administered 2017-09-01 – 2017-09-03 (×4): 10 mL
  Administered 2017-09-04: 20 mL
  Administered 2017-09-04 – 2017-09-06 (×4): 10 mL
  Administered 2017-09-06: 20 mL
  Administered 2017-09-07: 10 mL

## 2017-08-31 MED ORDER — AMIODARONE LOAD VIA INFUSION
150.0000 mg | Freq: Once | INTRAVENOUS | Status: DC
  Filled 2017-08-31: qty 83.34

## 2017-08-31 MED ORDER — NOREPINEPHRINE 4 MG/250ML-% IV SOLN
0.0000 ug/min | INTRAVENOUS | Status: DC
  Filled 2017-08-31: qty 250

## 2017-08-31 MED ORDER — AMIODARONE HCL IN DEXTROSE 360-4.14 MG/200ML-% IV SOLN
30.0000 mg/h | INTRAVENOUS | Status: DC
  Administered 2017-08-31: 30 mg/h via INTRAVENOUS
  Administered 2017-09-01: 60 mg/h via INTRAVENOUS
  Administered 2017-09-01: 30 mg/h via INTRAVENOUS
  Administered 2017-09-01 – 2017-09-03 (×7): 60 mg/h via INTRAVENOUS
  Administered 2017-09-03: 30 mg/h via INTRAVENOUS
  Administered 2017-09-03: 60 mg/h via INTRAVENOUS
  Administered 2017-09-04 (×2): 30 mg/h via INTRAVENOUS
  Administered 2017-09-04 (×2): 60 mg/h via INTRAVENOUS
  Administered 2017-09-05: 30 mg/h via INTRAVENOUS
  Filled 2017-08-31 (×18): qty 200

## 2017-08-31 MED ORDER — MILRINONE LACTATE IN DEXTROSE 20-5 MG/100ML-% IV SOLN
0.1250 ug/kg/min | INTRAVENOUS | Status: DC
  Administered 2017-08-31 – 2017-09-01 (×2): 0.25 ug/kg/min via INTRAVENOUS
  Filled 2017-08-31 (×2): qty 100

## 2017-08-31 MED ORDER — HYDROCORTISONE NA SUCCINATE PF 100 MG IJ SOLR
50.0000 mg | Freq: Three times a day (TID) | INTRAMUSCULAR | Status: DC
  Administered 2017-08-31 – 2017-09-01 (×2): 50 mg via INTRAVENOUS
  Filled 2017-08-31 (×2): qty 2

## 2017-08-31 MED ORDER — AMIODARONE LOAD VIA INFUSION
150.0000 mg | Freq: Once | INTRAVENOUS | Status: AC
  Administered 2017-08-31: 150 mg via INTRAVENOUS
  Filled 2017-08-31: qty 83.34

## 2017-08-31 MED ORDER — AMIODARONE HCL IN DEXTROSE 360-4.14 MG/200ML-% IV SOLN
60.0000 mg/h | INTRAVENOUS | Status: AC
  Administered 2017-08-31: 60 mg/h via INTRAVENOUS
  Filled 2017-08-31: qty 400

## 2017-08-31 MED ORDER — SODIUM CHLORIDE 0.9% FLUSH: 10.0000 mL | INTRAVENOUS | Status: DC | PRN

## 2017-08-31 MED ORDER — CHLORHEXIDINE GLUCONATE CLOTH 2 % EX PADS
6.0000 | MEDICATED_PAD | Freq: Every day | CUTANEOUS | Status: DC
  Administered 2017-08-31 – 2017-09-06 (×6): 6 via TOPICAL

## 2017-08-31 MED ORDER — WARFARIN SODIUM 3 MG PO TABS: 3.0000 mg | ORAL_TABLET | Freq: Once | ORAL | Status: DC

## 2017-08-31 MED ORDER — AMIODARONE IV BOLUS ONLY 150 MG/100ML
150.0000 mg | INTRAVENOUS | Status: AC | PRN
  Administered 2017-08-31 – 2017-09-01 (×3): 150 mg via INTRAVENOUS

## 2017-08-31 MED ORDER — AMIODARONE HCL 200 MG PO TABS: 400.0000 mg | ORAL_TABLET | Freq: Every day | ORAL | Status: DC

## 2017-08-31 NOTE — Progress Notes (Signed)
PULMONARY / CRITICAL CARE MEDICINE   Name: Derrick Marsh MRN: [id number] DOB: 02-06-1946    ADMISSION DATE:  08/27/2017 CONSULTATION DATE:  6/17   REFERRING MD:  Jarvis Newcomer  CHIEF COMPLAINT: Post procedural hypotension  HISTORY OF PRESENT ILLNESS:   This is 72 year old white male with a significant medical history of coronary artery disease, chronic stage pulmonary disease, peripheral artery disease, and tobacco abuse.  He was admitted on 6/14 chief complaint of shortness of breath, decreased exercise tolerance, dry nonproductive cough, increased exertional dyspnea.  Initially called his cardiologist with these complaints, was told to go to his primary care provider and obtain a EKG which he did.  This showed city of atrial fibrillation/atrial flutter he was then advised for further evaluation.  Subsequent evaluation his identified the following issues: Narrow complex tachycardia, atrial fib versus flutter and new acute on chronic renal failure.Chest x-ray was obtained demonstrating bilateral patchy pulmonary infiltrates, follow-up CT chest demonstrates groundglass opacities bilateral peripherally predominant.  Also had concurrent small bilateral pleural effusions which is favored to be pulmonary edema.  Treatment to date has included: Rate control, telemetry monitoring, IV anticoagulation, furosemide, and empiric antibiotics for potential acquired pneumonia.  An echocardiogram was obtained on 6/15 this demonstrates his systolic function to be severely reduced at 20 to 25% with associated diffuse hypokinesis, also had associated left atrium dilation, right ventricular size mildly to moderately dilated, mild tricuspid regurg, mildly elevated pressures.  Slow progression to the course of his hospitalization.  Was undergoing transesophageal echocardiogram on 6/17.  Preprocedural anesthesia included propofol.  He underwent successful DCCV to normal sinus rhythm following TEE.  Procedure completed by brief  respiratory arrest requiring 3 intubation and mechanical ventilation, postprocedure was extubated by anesthesia, but remained intensive.  Therefore pulmonary asked to evaluate.   SUBJECTIVE:  Off pressors.  Denies shortness of breath Back in atrial fibrillation VITAL SIGNS: Blood Pressure 102/69   Pulse (Abnormal) 116   Temperature 97.9 F (36.6 C) (Oral)   Respiration 16   Height 5\' 2"  (1.575 m)   Weight 111 lb 1.8 oz (50.4 kg)   Oxygen Saturation 100%   Body Mass Index 20.32 kg/m  6 L nasal cannula HEMODYNAMICS:    VENTILATOR SETTINGS:    INTAKE / OUTPUT: I/O last 3 completed shifts: In: 1272 [P.O.:240; I.V.:532; IV Piggyback:500] Out: 1930 [Urine:1929; Stool:1]  PHYSICAL EXAMINATION: General: Frail chronically ill-appearing 72 year old male currently no acute distress speaking full sentences without dyspnea HEENT normocephalic atraumatic positive jugular venous distention mucous membranes moist poor dentition Pulmonary: No accessory muscle use.  Scattered rales,  Heart attack: Regular irregular with atrial fibrillation on telemetry Abdomen: Soft nontender no organomegaly Extremities: Warm dry no significant edema Neuro: Awake oriented no focal deficits LABS:  BMET Recent Labs  Lab 08/30/17 0438 08/30/17 1941 08/31/17 0313  NA 139 142 139  K 4.4 5.0 4.4  CL 101 107 103  CO2 26 19* 20*  BUN 79* 84* 87*  CREATININE 2.71* 2.96* 3.03*  GLUCOSE 134* 114* 133*    Electrolytes Recent Labs  Lab 08/30/17 0438 08/30/17 1941 08/31/17 0313  CALCIUM 9.3 8.8* 8.2*  MG  --   --  2.2    CBC Recent Labs  Lab 08/27/17 1555 08/30/17 1710 08/31/17 0313  WBC 10.6* 14.2* 15.1*  HGB 12.5* 12.7* 10.4*  HCT 39.1 40.7 32.8*  PLT 366 358 265    Coag's Recent Labs  Lab 08/29/17 1305 08/30/17 0438 08/31/17 0313  APTT  --   --  89*  INR 1.16 1.21 1.57    Sepsis Markers Recent Labs  Lab 08/30/17 1710 08/31/17 0313  PROCALCITON 0.12 1.90    ABG Recent  Labs  Lab 08/30/17 2129 08/31/17 0515  PHART 7.261* 7.272*  PCO2ART 39.9 45.5  PO2ART 69.0* 101    Liver Enzymes Recent Labs  Lab 08/30/17 1941 08/31/17 0313  AST 300* 523*  ALT 327* 592*  ALKPHOS 98 93  BILITOT 1.3* 1.1  ALBUMIN 3.4* 3.4*    Cardiac Enzymes Recent Labs  Lab 08/28/17 0520 08/28/17 1038 08/30/17 1710  TROPONINI 0.06* 0.04* 0.06*    Glucose Recent Labs  Lab 08/30/17 1534 08/30/17 2114 08/31/17 0626  GLUCAP 152* 79 120*    Imaging Dg Chest Port 1 View  Result Date: 08/31/2017 CLINICAL DATA:  Shortness of breath EXAM: PORTABLE CHEST 1 VIEW COMPARISON:  08/30/2017 FINDINGS: Cardiac shadow is enlarged but stable. Postsurgical changes are again seen. There are diffuse bilateral infiltrative densities identified somewhat increased from the prior exam particularly on the left. No sizable effusion is seen. No acute bony abnormality is noted. IMPRESSION: Increasing left-sided infiltrates. Electronically Signed   By: Alcide Clever M.D.   On: 08/31/2017 07:11   Dg Chest Port 1 View  Result Date: 08/30/2017 CLINICAL DATA:  Shortness of breath and chest pressure EXAM: PORTABLE CHEST 1 VIEW COMPARISON:  08/27/2017 FINDINGS: Cardiac shadow is enlarged. Postsurgical changes are again identified and stable. Diffuse bilateral infiltrative changes are again identified and stable from the prior exam. No new focal infiltrate or sizable effusion is seen. No pneumothorax is seen. Bony structures are stable. IMPRESSION: No change from the prior exam. Electronically Signed   By: Alcide Clever M.D.   On: 08/30/2017 19:50   [place] Ekg Site Rite  Result Date: 08/30/2017 If Site Rite image not attached, placement could not be confirmed due to current cardiac rhythm.    STUDIES:  Echo 6/15: Wall thickness normal.  Estimated EF 20 to 25% with diffuse hypokinesis.  Severe hypokinesis of the basal mid inferior lateral myocardium.  Left atrium severely dilated, right ventricle mildly to  moderately dilated, reduced systolic function of RV.  Right atrium moderately dilated.  Pulmonary arteries with estimated peak pulmonary artery pressure 45 mmHg systolic. TEE 6/15: LVEF 15 to 20% with severely hypokinetic wall.  RV with normal structure and no mass or thrombus.  Left atrium severely dilated no thrombus or mass.  Left atrial appendage free of any thrombus or mass.  Left septum free of thrombus in the past.  No evidence of intra-arterial shunting Renal ultrasound 6/16: Right kidney unremarkable.  Left kidney atrophic with diffusely echogenic cortex suggesting chronic medical disease CT chest 6/14: Diffuse groundglass opacities bilateral perihilar predominant.  Bronchiectatic changes.  Current small bilateral pleural effusions.  Changes consistent with emphysema.  Changes consistent with aortic arthrosclerosis.  Pulmonary artery enlargement.  Bilateral pulmonary nodules next 7 mm.  CULTURES: 6/14 respiratory viral panel negative Blood culture 6/14 Strep 6/14: Negative  ANTIBIOTICS: Azithromycin 6/14  SIGNIFICANT EVENTS: Successful cardioversion 6/17  LINES/TUBES:   DISCUSSION: 30-year-old male patient admitted with acute decompensated heart failure with pulmonary edema +/-CAP.  Treated appropriately with IV diuresis, steroids, bronchodilators, empiric antibiotics.  Also in atrial fibrillation with rapid ventricular response and now status post successful cardioversion.  See post procedural hypotension  ASSESSMENT / PLAN: Resolved issues: Postprocedural hypotension.  Active issues: Acute systolic heart failure EF 15-20% likely exacerbated by atrial fibrillation with rapid ventricular response now status post successful cardioversion on  6/17 Coronary artery disease the vessel.  Complicated by demand ischemia -Off vasoactive drips. -Back in atrial fibrillation as of 6/18 -Unclear how much of his depressed cardiac function is exacerbated by his heart arrhythmia Plan Continue  anticoagulation Continue telemetry monitoring Continue aspirin and statin Central venous access to ck CVP and co-ox Starting amiodarone gtt w/ plan to possibly cardiovert again in am  May need inotropic support (af may limit this) Full DNR   Acute on chronic hypoxic respiratory failure appears to be pulmonary edema+/- CAP;  Superimposed on underlying chronic obstructive pulmonary disease Tobacco abuse Portable chest x-ray personally reviewed.  This demonstrates diffuse pulmonary infiltrates aeration worse today likely reflecting worsening pulmonary edema Plan Wean oxygen Resume prednisone Day #5 of 5 azithromycin Smoking cessation Continue bronchodilators DO NOT INTUBATE  Pulmonary nodules Plan Consider outpatient follow-up 6 months if he improves   Acute on chronic renal disease.  CKD stage III with associated anion gap metabolic acidosis By nephrology.  Suspect cardiorenal syndrome. Plan Maximize cardiac output Renal dose medications Deemed not a candidate for dialysis  DVT prophylaxis: IV heparin  SUP: NA  Diet: adv  Activity: advance Disposition : ICU   We will place central line, then defer inotrope support to heart failure team.  We will sign off after achieving central access as he is now a DO NOT RESUSCITATE, and hemodynamics will be managed per heart failure   Simonne MartinetPeter E Gailya Tauer ACNP-BC [facility]Critical [facility] Pager # [phone] OR # [phone] if no answer  08/31/2017, 10:26 AM

## 2017-08-31 NOTE — Procedures (Signed)
Central Venous Catheter Insertion Procedure Note Derrick Friendsony J Pogorzelski [id number] 1945/11/03  Procedure: Insertion of Central Venous Catheter Indications: Assessment of intravascular volume, Drug and/or fluid administration and Frequent blood sampling  Procedure Details Consent: Risks of procedure as well as the alternatives and risks of each were explained to the (patient/caregiver).  Consent for procedure obtained. Time Out: Verified patient identification, verified procedure, site/side was marked, verified correct patient position, special equipment/implants available, medications/allergies/relevent history reviewed, required imaging and test results available.  Performed  Maximum sterile technique was used including antiseptics, cap, gloves, gown, hand hygiene, mask and sheet. Skin prep: Chlorhexidine; local anesthetic administered A antimicrobial bonded/coated triple lumen catheter was placed in the right internal jugular vein using the Seldinger technique. Real time US used to ID and cannulate vessel  Evaluation Blood flow good Complications: No apparent complications Patient did tolerate procedure well. Chest X-ray ordered to verify placement.  CXR: pending.  Shelby Mattocksete E Darrik Richman 08/31/2017, 11:42 AM Simonne MartinetPeter E Keani Gotcher ACNP-BC [facility] Pager # [phone] OR # [phone] if no answer

## 2017-08-31 NOTE — Consult Note (Addendum)
Advanced Heart Failure Team Consult Note   Primary Physician: Renford Dills, MD PCP-Cardiologist:  No primary care provider on file.  Reason for Consultation: Cardiogenic shock  HPI:    Derrick Marsh is seen today for evaluation of Cardiogenic shock at the request of Dr. Herbie Baltimore.   Derrick Marsh is a 72 y.o. male with medical history significant of hypertension, hyperlipidemia, COPD, gout, CAD, CABG in 2000, PAD, tobacco abuse, PVC  Last seen in [facility] [place] office 01/06/2017. At that time was doing well. No changes made to medications or treatment plan.   Pt called [facility] office 08/26/17 with SOB and palpitations starting in "the spring". Had been taking de-congestants. Recommended he be seen by PCP to be sure he was not in Afib, and follow up scheduled.  EKG at PCP showed Afib/flutter, so advised to come to [facility] for further evaluation.   Pertinent labs on admission include K 4.9, Cr 2.36 (Unclear baseline with last lab in system from 2016), BNP 3310, Troponin 0.09, WBC 10.6, and Hgb 12.5. CXR with pulmonary congestion and patchy bilateral infiltrates. CT chest showed pulmonary edema with likely PNA, emphysema, PA enlargement, and bilateral pulmonary nodules.   Pt started on ABX and diuresed with IV lasix. Underwent TEE/DCCV 08/30/17 with return to NSR. Developed hypotension post procedure and started on phenylephrine. With resp distress was intubated, but tolerated extubation later that day.  This was stopped due to peripheral cyanosis. Heart Failure team consulted for further. Renal has been following for ARF  He feels better today.  He states he has felt gradually worse over the past 3 months since the big "pollen". He lives at home alone and tries to avoid going to the doctor unless he really needs it. Recently, he has been more and more SOB, up to SOB with ADLs. He has occasional lightheadedness. He quit smoking in may, but states it was only a cigar from time to time. Quite ETOH 29 years  ago, and has not used drugs in around the same time frame. He has had friends who are on dialysis and would "not want to live like that". He denies chest pains.   Echo 08/28/17 LVEF 20-25%, Severe HK, Mild MR, Severe LAE, RV mild to mod dilated, RV moderately reduced, RA moderately dilated, Mild/Mod TR, PA peak pressure 40 mm Hg.  Review of systems complete and found to be negative unless listed in HPI.    Home Medications Prior to Admission medications   Medication Sig Start Date End Date Taking? Authorizing Provider  aspirin EC 81 MG tablet Take 81 mg by mouth daily.   Yes [provider]  atorvastatin (LIPITOR) 40 MG tablet TAKE 1 TABLET EVERY DAY 01/11/17  Yes Turner, Cornelious Bryant, MD  colchicine 0.6 MG tablet Take 0.6 mg by mouth daily as needed (for gout flares).    Yes [provider]  metoprolol tartrate (LOPRESSOR) 25 MG tablet TAKE 1/2 TABLET BY MOUTH TWICE DAILY 12/28/16  Yes Quintella Reichert, MD    Past Medical History: Past Medical History:  Diagnosis Date  . Allergic rhinitis   . Atherosclerosis of native arteries of the extremities with intermittent claudication   . 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 HTN (hypertension)   . Hypercholesteremia   . PVC's (premature ventricular contractions)   . Tobacco abuse     Past Surgical History: Past  Surgical History:  Procedure Laterality Date  . CORONARY ARTERY BYPASS GRAFT  2000    Family History: Family History  Problem Relation Age of Onset  . Heart disease Father   . Emphysema Father   . CVA Father   . Heart failure Mother     Social History: Social History   Socioeconomic History  . Marital status: Single    Spouse name: Not on file  . Number of children: Not on file  . Years of education: Not on file  . Highest education level: Not on file  Occupational History  . Not on file  Social Needs  . Financial  resource strain: Not on file  . Food insecurity:    Worry: Not on file    Inability: Not on file  . Transportation needs:    Medical: Not on file    Non-medical: Not on file  Tobacco Use  . Smoking status: Current Some Day Smoker    Packs/day: 0.50    Types: Cigars  . Smokeless tobacco: Never Used  Substance and Sexual Activity  . Alcohol use: No  . Drug use: No  . Sexual activity: Not on file  Lifestyle  . Physical activity:    Days per week: Not on file    Minutes per session: Not on file  . Stress: Not on file  Relationships  . Social connections:    Talks on phone: Not on file    Gets together: Not on file    Attends religious service: Not on file    Active member of club or organization: Not on file    Attends meetings of clubs or organizations: Not on file    Relationship status: Not on file  Other Topics Concern  . Not on file  Social History Narrative  . Not on file    Allergies:  Allergies  Allergen Reactions  . Aleve [Naproxen] Other (See Comments)    Was told by a MD to not take this; conflicted with his other meds    Objective:    Vital Signs:   Temp:  [94 F (34.4 C)-97.9 F (36.6 C)] 97.9 F (36.6 C) (06/18 0804) Pulse Rate:  [25-139] 116 (06/18 0700) Resp:  [11-32] 14 (06/18 0700) BP: (69-115)/(29-99) 96/59 (06/18 0700) SpO2:  [83 %-100 %] 100 % (06/18 0700) Weight:  [103 lb 6.4 oz (46.9 kg)-111 lb 1.8 oz (50.4 kg)] 111 lb 1.8 oz (50.4 kg) (06/18 0500) Last BM Date: 08/30/17  Weight change: Filed Weights   08/30/17 0543 08/30/17 1225 08/31/17 0500  Weight: 103 lb 6.4 oz (46.9 kg) 103 lb 6.4 oz (46.9 kg) 111 lb 1.8 oz (50.4 kg)    Intake/Output:   Intake/Output Summary (Last 24 hours) at 08/31/2017 0808 Last data filed at 08/31/2017 0700 Gross per 24 hour  Intake 1031.98 ml  Output 730 ml  Net 301.98 ml    Physical Exam    General:  Elderly appearing. NAD. Thin HEENT: normal Neck: supple. JVP to ear. Carotids 2+ bilat; no bruits.  No lymphadenopathy or thyromegaly appreciated. Cor: PMI nondisplaced. Tachy, slightly irregular. No rubs, gallops or murmurs. Lungs: Diminished throughout with basilar crackles. Abdomen: Soft, nontender, nondistended. No hepatosplenomegaly. No bruits or masses. Good bowel sounds. Extremities: no cyanosis, clubbing, or rash. No edema. Legs warm. Neuro: alert & orientedx3, cranial nerves grossly intact. moves all 4 extremities w/o difficulty. Affect pleasant  Telemetry   Sinus tach vs Atypical Flutter, 110-120s, personally reviewed  EKG    EKG  this am appears to show 2:1 flutter.   Labs   Basic Metabolic Panel: Recent Labs  Lab 08/28/17 2238 08/29/17 0601 08/30/17 0438 08/30/17 1941 08/31/17 0313  NA 139 140 139 142 139  K 3.9 3.9 4.4 5.0 4.4  CL 102 104 101 107 103  CO2 23 25 26  19* 20*  GLUCOSE 168* 154* 134* 114* 133*  BUN 60* 60* 79* 84* 87*  CREATININE 2.57* 2.46* 2.71* 2.96* 3.03*  CALCIUM 9.2 9.3 9.3 8.8* 8.2*    Liver Function Tests: Recent Labs  Lab 08/30/17 1941 08/31/17 0313  AST 300* 523*  ALT 327* 592*  ALKPHOS 98 93  BILITOT 1.3* 1.1  PROT 6.5 5.9*  ALBUMIN 3.4* 3.4*   No results for input(s): LIPASE, AMYLASE in the last 168 hours. No results for input(s): AMMONIA in the last 168 hours.  CBC: Recent Labs  Lab 08/27/17 1555 08/30/17 1710 08/31/17 0313  WBC 10.6* 14.2* 15.1*  NEUTROABS  --   --  13.2*  HGB 12.5* 12.7* 10.4*  HCT 39.1 40.7 32.8*  MCV 98.0 101.0* 99.1  PLT 366 358 265   Cardiac Enzymes: Recent Labs  Lab 08/27/17 2239 08/28/17 0520 08/28/17 1038 08/30/17 1710  TROPONINI 0.06* 0.06* 0.04* 0.06*   BNP: BNP (last 3 results) Recent Labs    08/27/17 2239 08/31/17 0313  BNP 3,310.3* 1,505.7*   ProBNP (last 3 results) No results for input(s): PROBNP in the last 8760 hours.  CBG: Recent Labs  Lab 08/30/17 1534 08/30/17 2114 08/31/17 0626  GLUCAP 152* 79 120*    Coagulation Studies: Recent Labs     08/29/17 1305 08/30/17 0438 08/31/17 0313  LABPROT 14.7 15.2 18.6*  INR 1.16 1.21 1.57   Imaging   Dg Chest Port 1 View  Result Date: 08/31/2017 CLINICAL DATA:  Shortness of breath EXAM: PORTABLE CHEST 1 VIEW COMPARISON:  08/30/2017 FINDINGS: Cardiac shadow is enlarged but stable. Postsurgical changes are again seen. There are diffuse bilateral infiltrative densities identified somewhat increased from the prior exam particularly on the left. No sizable effusion is seen. No acute bony abnormality is noted. IMPRESSION: Increasing left-sided infiltrates. Electronically Signed   By: Alcide CleverMark  Lukens M.D.   On: 08/31/2017 07:11   Dg Chest Port 1 View  Result Date: 08/30/2017 CLINICAL DATA:  Shortness of breath and chest pressure EXAM: PORTABLE CHEST 1 VIEW COMPARISON:  08/27/2017 FINDINGS: Cardiac shadow is enlarged. Postsurgical changes are again identified and stable. Diffuse bilateral infiltrative changes are again identified and stable from the prior exam. No new focal infiltrate or sizable effusion is seen. No pneumothorax is seen. Bony structures are stable. IMPRESSION: No change from the prior exam. Electronically Signed   By: Alcide CleverMark  Lukens M.D.   On: 08/30/2017 19:50   [place] Ekg Site Rite  Result Date: 08/30/2017 If Site Rite image not attached, placement could not be confirmed due to current cardiac rhythm.    Medications:    Current Medications: . aspirin  81 mg Oral Daily  . atorvastatin  40 mg Oral Daily  . azithromycin  250 mg Oral Daily  . hydrocortisone sod succinate (SOLU-CORTEF) inj  100 mg Intravenous Q8H  . insulin aspart  0-5 Units Subcutaneous QHS  . insulin aspart  0-9 Units Subcutaneous TID WC  . nicotine  21 mg Transdermal Daily  . pantoprazole  40 mg Oral Daily  . warfarin  5 mg Oral ONCE-1800  . Warfarin - Pharmacist Dosing Inpatient   Does not apply [phone]  Infusions: . heparin 700 Units/hr (08/31/17 0700)  . phenylephrine (NEO-SYNEPHRINE) Adult infusion Stopped  (08/30/17 1846)     Patient Profile   Derrick Marsh is a 72 y.o. male with medical history significant of hypertension, hyperlipidemia, COPD, gout, CAD, CABG, PAD, tobacco abuse, and PVCs.   Admitted to [facility] 08/27/17 with severe weeks of SOB. Found to be in acute HF.   Assessment/Plan   1. Acute on chronic systolic CHF -> Cardiogenic shock - Echo 08/28/17 LVEF 20-25%, Severe HK, Mild MR, Severe LAE, RV mild to mod dilated, RV moderately reduced, RA moderately dilated, Mild/Mod TR, PA peak pressure 40 mm Hg. - TEE 08/30/17 LVEF 15-20%, Normal RV, Severe LAE, Mod RAE, Mod MR - Volume status difficult. Needs central access to follow CVP and Coox, place this morning.  - Lasix held this am with up-trend of creatinine.  - No BB with shock - No ACE/ ARB/ARNI or spiro with ARF - No digoxin with ARF - Not cath candidate currently with ARF.   2. Acute respiratory failure - following DCCV - Now extubated. Follow.  - pulse ox reading low.  - ABG OK this am with PO2 101.   3. Atrial Flutter, ? Atypical - s/p DCCV/TEE 08/30/17 - EKG this am appears to be back in flutter.  - Will start amiodarone.  - On coumadin for [facility] with ARF on CKF III/IV (Unclear baseline) (Can consider Eliquis in future) - CHA2DS2/VASc is at least 4.   4. CAD s/p CABG c LIMA to LAD, SVG to OM, SVG to RCA - No s/s of ischemia. Troponin peaked at 0.09. - Continue ASA  - LFTs going up. Hold statin until trends back down  5. ARF on CKD III (unclear baseline) - Continue to follow closely. Suspect cardiorenal component.  - BUN up to 87. Renal following.  - Per Nephrology he is not an HD candidate.   - Renal US 08/29/17 with 8.5 cm R kidney, and 6 cm L kidney.  6. COPD - Per primary.  - Continue prednisone per primary.  7. Tobacco Abuse - Encouraged complete cessation - He states he stopped the "occasional cigar" on May 24th. Former smoker of up to 2 ppd, but has been years.   8. Transaminitis - In setting of shock.    - Hold statin.  9. Lung nodule - by Chest CT 08/27/17. Will need follow up.   10. Prognosis  - Overall is poor - He is not a candidate for LVAD with renal dysfunction and likely pulmonary dysfunction - Not candidate for Heart/Kidney due to age.   Medication concerns reviewed with patient and pharmacy team. Barriers identified: None at this time.   Length of Stay: [address]  Graciella Freer, [place]  08/31/2017, 8:08 AM  Advanced Heart Failure Team Pager [phone] (M-F; 7a - 4p)  Please contact [facility] for night-coverage after hours (4p -7a ) and weekends on [email or web address]  Patient seen with PA, agree with the above note.    Patient was admitted with increased dyspnea, has been treated for COPD exacerbation with azithromycin and steroids, also initially diuresed for suspected component of volume overload.  He was noted to be in new atrial flutter with RVR at admission.  Echo showed EF 20-25% with moderately decreased RV systolic function (no prior).  He underwent TEE-guided DCCV to NSR yesterday but this was complicated by hypotension requiring phenylephrine as well as a short period of intubation (now extubated).  Creatinine was > 2 at admission (baseline  in 2018 was 1.6), now creatinine up to 3.03.  LFTs elevated.  Currently off pressors with SBP 90s-100s, he has gone back into atrial flutter with rate 110s generally.  No chest pain.   On exam, he is awake and alert.  JVP elevated 14-16 cm.  Lungs with decreased breath sounds and rhonchi.  Heart irregular S1S2, no S3 or murmur.   Impression:  1. Acute systolic CHF/cardiogenic shock: Echo with EF 20-25%, moderate RV systolic dysfunction.  We do not know his baseline EF, no echo in system.  He has history of CABG, so ischemic cardiomyopathy is possible.  No chest pain.  TnI was mildly elevated at 0.09 but likely demand ischema with volume overload/hypotension/tachycardia.  Tachycardia-mediated cardiomyopathy is also possible, he was admitted  in atypical flutter with RVR, unsure of how long he had been in it.  He was cardioverted on 6/17 but is back in atrial flutter with RVR today.  He was hypotensive post-TEE yesterday, required intubation and phenylephrine.  Now extubated with SBP 90s-100s off pressors. On exam, I think he looks volume overloaded with JVD and pulmonary edema on CT chest.  I suspect low output HF at this point with marked rise in creatinine and soft BP.   - He needs central access, will hold heparin gtt briefly and place today.  Will follow CVP and co-ox.  If co-ox is low (suspect it will be), will add milrinone gtt carefully and use amiodarone gtt to control HR.   - I suspect CVP will be elevated.  He will most likely need diuresis, but will see what co-ox looks like and will start milrinone before diuresing if co-ox is low.  I suspect he eventually will need RHC.  - No ACEI/ARB/ARNI/digoxin/spironolactone with AKI, No beta blocker with suspected low output.  - We need to get him back into NSR.  Will start amiodarone gtt (see below) and plan repeat DCCV later this week.  - Cannot rule out progression of CAD with ischemic CMP though doubt ACS this admission.  Creatinine too high for cath though needs eventually if creatinine comes down.   - Not candidate for advanced therapies currently with AKI.  2. Atrial flutter: With RVR.  Atypical flutter.  DCCV yesterday but did not hold, back in flutter with RVR today.  He was admitted with aflutter/RVR, cannot rule out tachy-mediated CMP.   - Add amiodarone gtt for rate control and to hold in NSR after repeat DCCV. - Continue heparin gtt, would eventually transition to apixaban.  - Will need repeat DCCV after he has been on amiodarone for a period of time.  Either tomorrow or Thursday depending on stability.  3. AKI: On suspected CKD stage 3.  Creatinine 1.6 in 2018.  Had small kidneys suggestive of medical renal disease on last [place].  Creatinine up to 3.03 today, suspect due to  hypotension/low output/cardiorenal syndrome.  I am quite concerned by his renal function as his baseline is likely poor.  He would not be a very good HD candidate with CHF/low EF.  Will get an idea of his cardiac output after CVL placed, inotrope/diuresis may help renal function.  4. Elevated LFTs: Possible shock liver in setting of hypotension/low output.   - Statin held for now.  - Will use amiodarone but will monitor LFTs closely.  5. CAD: s/p CABG 2000, no cath since that time.  See discussion above, cannot rule out ischemic cardiomyopathy but doubt ACS => TnI 0.06 with no trend.  6. COPD:  Treating for COPD exacerbation with hydrocortisone/azithromycin.   - Per CCM. - Will need followup of lung nodule.   Marca Ancona 08/31/2017 10:36 AM

## 2017-08-31 NOTE — Progress Notes (Signed)
eLink Physician-Brief Progress Note Patient Name: Derrick Marsh DOB: [birth year] MRN: [id number]   Date of Service  08/31/2017  HPI/Events of Note  HR remains elevated in the 110's to 120's.  eICU Interventions  Will order: 1. 12 Lead EKG now.      Intervention Category Major Interventions: Arrhythmia - evaluation and management  Annebelle Bostic [place] 08/31/2017, 12:39 AM

## 2017-08-31 NOTE — Care Management Note (Signed)
Case Management Note  Patient Details  Name: Derrick Marsh MRN: [id number] Date of Birth: Sep 30, 1945  Subjective/Objective:   Pt admitted with A flutter - developed vascular issues and transferred to ICU                 Action/Plan:   PTA independent from home alone, pt uses cane when needed.  Pt has PCP and denied barriers with obtaining/paying for medications as prescribed.  CM will continue to follow for discharge needs   Expected Discharge Date:                  Expected Discharge Plan:     In-House Referral:  Clinical Social Work  Discharge planning Services  CM Consult  Post Acute Care Choice:    Choice offered to:     DME Arranged:    DME Agency:     HH Arranged:    HH Agency:     Status of Service:     If discussed at [employer] Meetings, dates discussed:    Additional Comments:  Cherylann ParrClaxton, Arelyn Gauer S, RN 08/31/2017, 4:01 PM

## 2017-08-31 NOTE — Progress Notes (Addendum)
ANTICOAGULATION CONSULT NOTE  Pharmacy Consult for Heparin  Indication: atrial fibrillation  Allergies  Allergen Reactions  . Aleve [Naproxen] Other (See Comments)    Was told by a MD to not take this; conflicted with his other meds    Patient Measurements: Height: 5\' 2"  (157.5 cm) Weight: 111 lb 1.8 oz (50.4 kg) IBW/kg (Calculated) : 54.6 Heparin Dosing Weight: 48.3 kg  Vital Signs: Temp: 97.9 F (36.6 C) (06/18 0804) Temp Source: Oral (06/18 0804) BP: 96/59 (06/18 0700) Pulse Rate: 116 (06/18 0700)  Labs: Recent Labs    08/28/17 1038  08/29/17 0601 08/29/17 1305 08/30/17 0438 08/30/17 1710 08/30/17 1941 08/31/17 0313  HGB  --   --   --   --   --  12.7*  --  10.4*  HCT  --   --   --   --   --  40.7  --  32.8*  PLT  --   --   --   --   --  358  --  265  APTT  --   --   --   --   --   --   --  89*  LABPROT  --   --   --  14.7 15.2  --   --  18.6*  INR  --   --   --  1.16 1.21  --   --  1.57  HEPARINUNFRC  --    < > 0.52  --  0.42  --   --  0.30  CREATININE  --    < > 2.46*  --  2.71*  --  2.96* 3.03*  TROPONINI 0.04*  --   --   --   --  0.06*  --   --    < > = values in this interval not displayed.    Estimated Creatinine Clearance: 15.7 mL/min (A) (by C-G formula based on SCr of 3.03 mg/dL (H)).   Medical History: Past Medical History:  Diagnosis Date  . Allergic rhinitis   . Atherosclerosis of native arteries of the extremities with intermittent claudication   . 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 HTN (hypertension)   . Hypercholesteremia   . PVC's (premature ventricular contractions)   . Tobacco abuse     Assessment: 72 yo with PMH CAD w/ CABG (2000) p/w tachycardia and dyspnea. Pharmacy consulted to dose heparin/warfarin for afib. CBC low stable with no signs/symptoms of bleeding.   Heparin level this morning is therapeutic at 0.3, on 700 units/hr. INR increased  from 1.21>1.57, still subtherapeutic. Dose of warfarin was missed last night. Hgb down slightly to 10.4, plt 265. No s/sx of bleeding. DDI noted with warfarin and azithromycin - last dose today. Meal intake documented at 75-100% on 6/16- no intake documented yesterday due to DCCV/TEE.   Tentative plan to transition to apixaban in future from warfarin. Will continue on heparin for central line placement. Would qualify for reduced apixaban dose given weight and renal fx.   Goal of Therapy:  Heparin level 0.3-0.7 units/ml Monitor platelets by anticoagulation protocol: Yes   Plan:  Increase heparin gtt slightly to 750 units/hr Daily CBCs, heparin level F/u s/sx bleeding, PO intake, DDI  Thank you for allowing us to participate in this patients care.  Girard CooterKimberly Perkins, PharmD Clinical Pharmacist  Pager: [phone] Phone: [phone] 08/31/2017 8:12 AM

## 2017-08-31 NOTE — Progress Notes (Signed)
Progress Note  Patient Name: Derrick Marsh Date of Encounter: 08/31/2017  Primary Cardiologist: No primary care provider on file.   Subjective   Remarkably had a very difficult time post cardioversion with hypotension and hypoxia.  He was intubated temporarily.  Successfully extubated, remained hypotensive.  Was on Neo-Synephrine for short period time and then received IV fluid hydration overnight. Remains acidotic -metabolic. Unfortunately after initially successful cardioversion, he is now back in atrial flutter.  Remarkably, he feels fine he feels no different than he did yesterday for his cardioversion procedure.  Inpatient Medications    Scheduled Meds: . aspirin  81 mg Oral Daily  . atorvastatin  40 mg Oral Daily  . azithromycin  250 mg Oral Daily  . hydrocortisone sod succinate (SOLU-CORTEF) inj  100 mg Intravenous Q8H  . insulin aspart  0-5 Units Subcutaneous QHS  . insulin aspart  0-9 Units Subcutaneous TID WC  . nicotine  21 mg Transdermal Daily  . pantoprazole  40 mg Oral Daily  . warfarin  5 mg Oral ONCE-1800  . Warfarin - Pharmacist Dosing Inpatient   Does not apply q1800   Continuous Infusions: . heparin 700 Units/hr (08/31/17 0700)  . phenylephrine (NEO-SYNEPHRINE) Adult infusion Stopped (08/30/17 1846)   PRN Meds: acetaminophen, colchicine, dextromethorphan-guaiFENesin, ipratropium, levalbuterol, nitroGLYCERIN, ondansetron (ZOFRAN) IV   Vital Signs    Vitals:   08/31/17 0500 08/31/17 0600 08/31/17 0700 08/31/17 0804  BP: 94/62  (!) 96/59   Pulse: (!) 116 (!) 115 (!) 116   Resp: 13 12 14    Temp:    97.9 F (36.6 C)  TempSrc:    Oral  SpO2: 100% 100% 100%   Weight: 111 lb 1.8 oz (50.4 kg)     Height:        Intake/Output Summary (Last 24 hours) at 08/31/2017 0818 Last data filed at 08/31/2017 0700 Gross per 24 hour  Intake 1031.98 ml  Output 730 ml  Net 301.98 ml   Filed Weights   08/30/17 0543 08/30/17 1225 08/31/17 0500  Weight: 103 lb 6.4  oz (46.9 kg) 103 lb 6.4 oz (46.9 kg) 111 lb 1.8 oz (50.4 kg)    Telemetry    Atrial flutter with rapid response rates in 110s-120s Bpm.- Personally Reviewed  ECG    Atrial flutter rate 122 (read as sinus tachycardia is more similar to his prior atrial flutter) Bpm.- Personally Reviewed  Physical Exam   Physical Exam  Constitutional: He is oriented to person, place, and time. No distress.  Thin, frail, diffuse tattoos.  No acute distress.  HENT:  Head: Normocephalic and atraumatic.  Neck: Normal range of motion. Neck supple. JVD (To angle of jaw bilaterally. -Partially related to [facility] A waves from atrial flutter) present.  Cardiovascular: Normal heart sounds and intact distal pulses. A regularly irregular rhythm present. Tachycardia present. PMI is displaced (Mildly laterally displaced). Exam reveals no gallop and no friction rub.  No murmur heard. Pulmonary/Chest: Effort normal. No respiratory distress. He has no wheezes. He has no rales (Dry crackles, no rhonchi). He exhibits no tenderness.  Mild interstitial breath sounds - crackles  Abdominal: Soft. Bowel sounds are normal. He exhibits no distension. There is no tenderness. There is no rebound.  Musculoskeletal: Normal range of motion. He exhibits no edema.  Neurological: He is alert and oriented to person, place, and time.  Skin: Skin is warm and dry. He is not diaphoretic. No pallor (No longer purple).  Extensive tattoos on arms and chest  Psychiatric: He has a normal mood and affect. His behavior is normal. Judgment and thought content normal.  Vitals reviewed.   Labs    Chemistry Recent Labs  Lab 08/30/17 0438 08/30/17 1941 08/31/17 0313  NA 139 142 139  K 4.4 5.0 4.4  CL 101 107 103  CO2 26 19* 20*  GLUCOSE 134* 114* 133*  BUN 79* 84* 87*  CREATININE 2.71* 2.96* 3.03*  CALCIUM 9.3 8.8* 8.2*  PROT  --  6.5 5.9*  ALBUMIN  --  3.4* 3.4*  AST  --  300* 523*  ALT  --  327* 592*  ALKPHOS  --  98 93  BILITOT   --  1.3* 1.1  GFRNONAA 22* 20* 19*  GFRAA 25* 23* 22*  ANIONGAP 12 16* 16*     Hematology Recent Labs  Lab 08/27/17 1555 08/30/17 1710 08/31/17 0313  WBC 10.6* 14.2* 15.1*  RBC 3.99* 4.03* 3.31*  HGB 12.5* 12.7* 10.4*  HCT 39.1 40.7 32.8*  MCV 98.0 101.0* 99.1  MCH 31.3 31.5 31.4  MCHC 32.0 31.2 31.7  RDW 15.6* 15.9* 15.2  PLT 366 358 265    Cardiac Enzymes Recent Labs  Lab 08/27/17 2239 08/28/17 0520 08/28/17 1038 08/30/17 1710  TROPONINI 0.06* 0.06* 0.04* 0.06*    Recent Labs  Lab 08/27/17 1626  TROPIPOC 0.09*     BNP Recent Labs  Lab 08/27/17 2239 08/31/17 0313  BNP 3,310.3* 1,505.7*     DDimer No results for input(s): DDIMER in the last 168 hours.   Radiology    [place] Renal  Result Date: 08/29/2017 CLINICAL DATA:  Elevated serum creatinine.  LEFT renal atrophy. EXAM: RENAL / URINARY TRACT ULTRASOUND COMPLETE COMPARISON:  Renal ultrasound dated 09/09/2015. FINDINGS: Right Kidney: Length: 8.5 cm. Echogenicity within normal limits. No mass or hydronephrosis visualized. Left Kidney: Length: 6.3 cm. LEFT renal cortex is diffusely echogenic. No mass or hydronephrosis visualized. Bladder: Decompressed limiting characterization, but unremarkable. IMPRESSION: 1. RIGHT kidney is unremarkable. 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 No hydronephrosis. 2. LEFT kidney is atrophic with diffusely echogenic cortex indicating chronic medical renal disease. No acute findings. No hydronephrosis. 3. Spleen is somewhat heterogeneous in appearance, of uncertain significance. Electronically Signed   By: Bary RichardStan  Maynard M.D.   On: 08/29/2017 15:36   Dg Chest Port 1 View  Result Date: 08/31/2017 CLINICAL DATA:  Shortness of breath EXAM: PORTABLE CHEST 1 VIEW COMPARISON:  08/30/2017 FINDINGS: Cardiac shadow is enlarged but stable. Postsurgical changes are again seen. There  are diffuse bilateral infiltrative densities identified somewhat increased from the prior exam particularly on the left. No sizable effusion is seen. No acute bony abnormality is noted. IMPRESSION: Increasing left-sided infiltrates. Electronically Signed   By: Alcide CleverMark  Lukens M.D.   On: 08/31/2017 07:11   Dg Chest Port 1 View  Result Date: 08/30/2017 CLINICAL DATA:  Shortness of breath and chest pressure EXAM: PORTABLE CHEST 1 VIEW COMPARISON:  08/27/2017 FINDINGS: Cardiac shadow is enlarged. Postsurgical changes are again identified and stable. Diffuse bilateral infiltrative changes are again identified and stable from the prior exam. No new focal infiltrate or sizable effusion is seen. No pneumothorax is seen. Bony structures are stable. IMPRESSION: No change from the prior exam. Electronically Signed   By: Alcide CleverMark  Lukens M.D.   On: 08/30/2017 19:50   [place] Ekg Site Rite  Result Date: 08/30/2017 If Site Rite image not attached, placement could not be  confirmed due to current cardiac rhythm.   Cardiac Studies   Echo 08/28/2017: EF 20-25% with diffuse HK.  Aortic sclerosis with no suggestion of stenosis.  Mild to moderate RV dilation with moderate dysfunction.  Moderate RA dilation.  Severe LA dilation.  PA pressure 40 mmHg.  Patient Profile     72 y.o. male with a past medical history significant forCAD with CABG (in 2000),COPD,hypertension, hyperlipidemia,andtobacco abuse.  We were asked to see him secondary to tachycardia and dyspnea --> he was noted to be in atrial flutter with rapid ventricular rate.  Was started on IV heparin with warfarin. Also noted to have dilated cardiomyopathy of unclear etiology (ischemic versus rate related) with severely reduced EF.  He was noted to have acute combined heart failure -on IV Lasix. Initially successful TEE cardioversion on 08/30/2017, however reverted back to atrial flutter. IV Lasix held - actually IVF given.  Assessment & Plan    Principal Problem:    Acute on chronic respiratory failure with hypoxia (HCC) Active Problems:   CAD, multiple vessel -s/p CABG   Elevated troponin = likley demand ischemia   Dilated cardiomyopathy (HCC)   Acute combined systolic and diastolic heart failure (HCC) -complicated by rapid atrial flutter   Atrial flutter with rapid ventricular response (HCC)   Hyperlipidemia with target LDL less than 70   Essential hypertension   Tobacco abuse   Acute renal failure superimposed on stage 3 chronic kidney disease (HCC)   COPD with acute exacerbation (HCC)   Lung nodule     Acute on chronic respiratory failure with hypoxia (HCC) - in part related to Acute combined systolic and diastolic heart failure (HCC) -complicated by rapid atrial flutter  Has significant JVD with HJR, but complicated by atrial flutter.  Clinically hard to tell with JVD & no edema his volume status -- with Creatinine increasing along with BUN - holding off on  IV Lasix until recommendations by CHF service  Adv CHF Svc consulted for assistance with therapy going forward & OP f/u.    CAD, multiple vessel -s/p CABG;   Elevated troponin = likley demand ischemia  Minimal troponin elevation is likely related to demand ischemia not ACS.    Continue aspirin and statin -- Metoprolol on hold for Hypotension  History of CABG, but no further reports since  No chest pain despite heart rates in the 150s.  Essentially negative stress test .    Dilated cardiomyopathy (HCC) --echo shows EF 20-25%.  Unsure of etiology - clearly at lease partially ischemic meditated with regional WMA on Echo.  Unfortunately we do not have any data on EF-from stress test or from echocardiogram since CABG in 2000. -- Current drop in Fx may likely be related to Aflutter (tachycardia mediated).      Atrial flutter with rapid ventricular response (HCC) -  Started on IV heparin with warfarin -- s/p TEE cardioversion yesterday at 1 PM. -- NOW back in Aflutter with slow  rate.  Warfarin chosen over DOAC because of CKD 3-4  Was on metoprolol 50 mg twice daily - now on hold with hypotension.  With recurrent Afib - will give IV Amio bolus then follow with PO loading.  May consider outpatient EP consultation to consider possibility of ablation    Hyperlipidemia with target LDL less than 70 -on statin.  Stable dose   Essential hypertension - no essentially hypotensive - no BP meds (off pressors)   Tobacco abuse -counseling recommended; nicotine patch in place  Acute renal failure superimposed on stage 3 chronic kidney disease (HCC)  -  Followed by Nephrology.  -hold off on ACE inhibitor/ARB until normalized and blood pressure stable.  Creatinine slightly worse today, may need to hold off on IV Lasix for now -  Not likely an HD candidate with EF 15-20%.     COPD with acute exacerbation (HCC) -Per hospitalist team  Lungs sound more like dry crackles than rales.    For questions or updates, please contact [facility] Please consult [email or web address] for contact info under Cardiology/STEMI.    Bryan Lemma, MD  08/31/2017, 8:18 AM

## 2017-08-31 NOTE — Progress Notes (Signed)
D/W  2H Nursing and pt vitals evaluated HR 140'S  BP noted  Fairly stable. I will inform HF attending as well.  Planned to back off slightly on Milrinone to offset  Tachycarrhythmia,  Amio gtt  Bolus  To be given  As well. Orders  discissed  With nursing  Team .  Will re eval in about  One hour

## 2017-08-31 NOTE — Progress Notes (Signed)
eLink Physician-Brief Progress Note Patient Name: Verlan Friendsony J Nodine DOB: 1946-01-05 MRN: [id number]   Date of Service  08/31/2017  HPI/Events of Note  ABG on 5 L/min Kaycee O2 = 7.27/45.5/101/20.5.   eICU Interventions  Will order: 1. D/C Ambien. 2. Incentive spirometry Q 1 hour while awake.      Intervention Category Major Interventions: Acid-Base disturbance - evaluation and management  Mamadou Breon Eugene 08/31/2017, 6:49 AM

## 2017-08-31 NOTE — Progress Notes (Signed)
HF team took over care of patient. They will be primary. Will sign off  [facility] Marsh [facility]

## 2017-08-31 NOTE — Progress Notes (Signed)
[facility] Progress Note    Assessment/ Plan:   1. AoCKD3; follows at our office (prev [facility] [place], no visit since his retirement) SCr was 1.6 11/2016.  Renal US with small, shrunken kidneys, L > R.  He does not have lots of reserve; Cr continues to inch upwards but this is not entirely unexpected in the setting of hypotension after TEE yesterday and what I would expect to be reduced effective arterial blood volume in setting of advanced CHF and Aflutter.  Of note, he is a poor iHD candidate at present based on his suboptimal hemodynamics and severely depressed EF. Fortunately there are no immediate needs for dialysis at this time.  He is still making some urine  2. Acute systolic CHF: EF 16-10%15-20% by TEE yesterday, severely dilated LA.  Advanced CHF consulted.  CXR this AM with increased bilateral infiltrates- was previously on Lasix 40 IV BID, will hold off on restarting pending recs of advanced CHF.  3. Aflutter: DCCV unsuccessful, per cardiology will start amio, continue on hep gtt.  Per cardiology 4. CAP: on antibiotics, sputum culture when able 5. Elevated TSH: TSH > 10 6/17-- per primary 6. Elevated LFTs: I suspect shock liver/ possible congestive hepatopathy, per primary 7. Mild metabolic acidosis:  After hypotension, CO2 was WNL yesterday. If no improvement in next 24-48 hrs, would do sodium bicarb but has such a sig sodium load will want to see if this corrects on its own before pursuing. 8. Dispo: in ICU until amio given, then possibly out to floor   Subjective:    Had TEE/ cardioversion  Yesterday.  Post-procedure, became hypotensive, was placed on phenylephrine without any real improvement in hemodynamics, got a small IVF bolus.  This AM sitting in bed eating breakfast, back in Aflutter.  Advanced HF consulted.  EF by TEE 15%.     Objective:   BP 102/69   Pulse (!) 116   Temp 97.9 F (36.6 C) (Oral)   Resp 16   Ht 5\' 2"  (1.575 m)   Wt 50.4 kg (111 lb 1.8 oz)    SpO2 100%   BMI 20.32 kg/m   Intake/Output Summary (Last 24 hours) at 08/31/2017 0914 Last data filed at 08/31/2017 0900 Gross per 24 hour  Intake 1046.03 ml  Output 860 ml  Net 186.03 ml   Weight change: 0 kg (0 lb)  Physical Exam: GEN:No acute distress, sitting upright in bed RUE:AVWUENT:NCAT, JVD to angle of mandible upright- prominent JVP EYES:EOMI JW:[id number]:Tachycardic, regular today, no rub PULM:L > R inspiratory crackles OZH:YQMVABD:Soft, nontender, no suprapubic distention or tenderness, bowel sounds present GU: Foley placed SKIN:heavily tattooed EXT:No edema    Imaging: [place] Renal  Result Date: 08/29/2017 CLINICAL DATA:  Elevated serum creatinine.  LEFT renal atrophy. EXAM: RENAL / URINARY TRACT ULTRASOUND COMPLETE COMPARISON:  Renal ultrasound dated 09/09/2015. FINDINGS: Right Kidney: Length: 8.5 cm. Echogenicity within normal limits. No mass or hydronephrosis visualized. Left Kidney: Length: 6.3 cm. LEFT renal cortex is diffusely echogenic. No mass or hydronephrosis visualized. Bladder: Decompressed limiting characterization, but unremarkable. IMPRESSION: 1. RIGHT kidney is unremarkable. 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 No hydronephrosis. 2. LEFT kidney is atrophic with diffusely echogenic cortex indicating chronic medical renal disease. No acute findings. No hydronephrosis. 3. Spleen is somewhat heterogeneous in appearance, of uncertain significance. Electronically Signed   By: Bary RichardStan  Maynard M.D.   On: 08/29/2017 15:36   Dg Chest Port 1  View  Result Date: 08/31/2017 CLINICAL DATA:  Shortness of breath EXAM: PORTABLE CHEST 1 VIEW COMPARISON:  08/30/2017 FINDINGS: Cardiac shadow is enlarged but stable. Postsurgical changes are again seen. There are diffuse bilateral infiltrative densities identified somewhat increased from the prior exam particularly on the left. No  sizable effusion is seen. No acute bony abnormality is noted. IMPRESSION: Increasing left-sided infiltrates. Electronically Signed   By: Alcide Clever M.D.   On: 08/31/2017 07:11   Dg Chest Port 1 View  Result Date: 08/30/2017 CLINICAL DATA:  Shortness of breath and chest pressure EXAM: PORTABLE CHEST 1 VIEW COMPARISON:  08/27/2017 FINDINGS: Cardiac shadow is enlarged. Postsurgical changes are again identified and stable. Diffuse bilateral infiltrative changes are again identified and stable from the prior exam. No new focal infiltrate or sizable effusion is seen. No pneumothorax is seen. Bony structures are stable. IMPRESSION: No change from the prior exam. Electronically Signed   By: Alcide Clever M.D.   On: 08/30/2017 19:50   [place] Ekg Site Rite  Result Date: 08/30/2017 If Site Rite image not attached, placement could not be confirmed due to current cardiac rhythm.   Labs: BMET Recent Labs  Lab 08/27/17 1555 08/28/17 1513 08/28/17 2238 08/29/17 0601 08/30/17 0438 08/30/17 1941 08/31/17 0313  NA 138 140 139 140 139 142 139  K 4.9 5.4* 3.9 3.9 4.4 5.0 4.4  CL 106 104 102 104 101 107 103  CO2 21* 25 23 25 26  19* 20*  GLUCOSE 108* 184* 168* 154* 134* 114* 133*  BUN 40* 54* 60* 60* 79* 84* 87*  CREATININE 2.36* 2.64* 2.57* 2.46* 2.71* 2.96* 3.03*  CALCIUM 9.8 9.3 9.2 9.3 9.3 8.8* 8.2*   CBC Recent Labs  Lab 08/27/17 1555 08/30/17 1710 08/31/17 0313  WBC 10.6* 14.2* 15.1*  NEUTROABS  --   --  13.2*  HGB 12.5* 12.7* 10.4*  HCT 39.1 40.7 32.8*  MCV 98.0 101.0* 99.1  PLT 366 358 265    Medications:    . amiodarone  150 mg Intravenous Once  . amiodarone  400 mg Oral Q8H   Followed by  . [START ON 09/08/2017] amiodarone  400 mg Oral Daily  . aspirin  81 mg Oral Daily  . hydrocortisone sod succinate (SOLU-CORTEF) inj  100 mg Intravenous Q8H  . insulin aspart  0-5 Units Subcutaneous QHS  . insulin aspart  0-9 Units Subcutaneous TID WC  . nicotine  21 mg Transdermal Daily  .  pantoprazole  40 mg Oral Daily  . warfarin  3 mg Oral ONCE-1800  . Warfarin - Pharmacist Dosing Inpatient   Does not apply q1800      Bufford Buttner, MD 08/31/2017, 9:14 AM

## 2017-08-31 NOTE — Anesthesia Postprocedure Evaluation (Signed)
Anesthesia Post Note  Patient: Derrick Marsh  Procedure(s) Performed: TRANSESOPHAGEAL ECHOCARDIOGRAM (TEE) (N/A ) CARDIOVERSION (N/A )     Patient location during evaluation: SICU Anesthesia Type: General Level of consciousness: awake, oriented and lethargic Pain management: pain level controlled Vital Signs Assessment: post-procedure vital signs reviewed and stable Respiratory status: spontaneous breathing, nonlabored ventilation and respiratory function stable Cardiovascular status: blood pressure returned to baseline Anesthetic complications: no    Last Vitals:  Vitals:   08/31/17 1400 08/31/17 1500  BP: 104/77 95/76  Pulse: 98 98  Resp: 14 16  Temp:    SpO2: 100% 100%    Last Pain:  Vitals:   08/31/17 1200  TempSrc:   PainSc: 0-No pain                 Odilon Cass COKER

## 2017-09-01 ENCOUNTER — Inpatient Hospital Stay (HOSPITAL_COMMUNITY): Payer: Medicare Other

## 2017-09-01 ENCOUNTER — Encounter (HOSPITAL_COMMUNITY): Payer: Self-pay | Admitting: Internal Medicine

## 2017-09-01 DIAGNOSIS — Z66 Do not resuscitate: Secondary | ICD-10-CM

## 2017-09-01 DIAGNOSIS — Z515 Encounter for palliative care: Secondary | ICD-10-CM

## 2017-09-01 LAB — COMPREHENSIVE METABOLIC PANEL
ALT: 433 U/L — AB (ref 17–63)
AST: 192 U/L — AB (ref 15–41)
Albumin: 3.5 g/dL (ref 3.5–5.0)
Alkaline Phosphatase: 95 U/L (ref 38–126)
Anion gap: 12 (ref 5–15)
BUN: 92 mg/dL — AB (ref 6–20)
CHLORIDE: 101 mmol/L (ref 101–111)
CO2: 22 mmol/L (ref 22–32)
CREATININE: 3.17 mg/dL — AB (ref 0.61–1.24)
Calcium: 8.4 mg/dL — ABNORMAL LOW (ref 8.9–10.3)
GFR calc non Af Amer: 18 mL/min — ABNORMAL LOW (ref >60)
GFR, EST AFRICAN AMERICAN: 21 mL/min — AB (ref >60)
Glucose, Bld: 165 mg/dL — ABNORMAL HIGH (ref 65–99)
Potassium: 3.8 mmol/L (ref 3.5–5.1)
SODIUM: 135 mmol/L (ref 135–145)
Total Bilirubin: 0.8 mg/dL (ref 0.3–1.2)
Total Protein: 6.2 g/dL — ABNORMAL LOW (ref 6.5–8.1)

## 2017-09-01 LAB — HEPARIN LEVEL (UNFRACTIONATED): Heparin Unfractionated: 0.7 IU/mL (ref 0.30–0.70)

## 2017-09-01 LAB — GLUCOSE, CAPILLARY
GLUCOSE-CAPILLARY: 146 mg/dL — AB (ref 65–99)
Glucose-Capillary: 153 mg/dL — ABNORMAL HIGH (ref 65–99)
Glucose-Capillary: 137 mg/dL — ABNORMAL HIGH (ref 65–99)
Glucose-Capillary: 148 mg/dL — ABNORMAL HIGH (ref 65–99)

## 2017-09-01 LAB — PHOSPHORUS: Phosphorus: 6.4 mg/dL — ABNORMAL HIGH (ref 2.5–4.6)

## 2017-09-01 LAB — CBC WITH DIFFERENTIAL/PLATELET
Abs Immature Granulocytes: 0.1 10*3/uL (ref 0.0–0.1)
Basophils Absolute: 0 10*3/uL (ref 0.0–0.1)
Basophils Relative: 0 %
EOS ABS: 0 10*3/uL (ref 0.0–0.7)
Eosinophils Relative: 0 %
HEMATOCRIT: 29.7 % — AB (ref 39.0–52.0)
HEMOGLOBIN: 9.6 g/dL — AB (ref 13.0–17.0)
IMMATURE GRANULOCYTES: 0 %
LYMPHS ABS: 0.4 10*3/uL — AB (ref 0.7–4.0)
Lymphocytes Relative: 4 %
MCH: 31.1 pg (ref 26.0–34.0)
MCHC: 32.3 g/dL (ref 30.0–36.0)
MCV: 96.1 fL (ref 78.0–100.0)
Monocytes Absolute: 1.1 10*3/uL — ABNORMAL HIGH (ref 0.1–1.0)
Monocytes Relative: 9 %
NEUTROS PCT: 87 %
Neutro Abs: 10.6 10*3/uL — ABNORMAL HIGH (ref 1.7–7.7)
Platelets: 254 10*3/uL (ref 150–400)
RBC: 3.09 MIL/uL — ABNORMAL LOW (ref 4.22–5.81)
RDW: 14.6 % (ref 11.5–15.5)
WBC: 12.2 10*3/uL — ABNORMAL HIGH (ref 4.0–10.5)

## 2017-09-01 LAB — COOXEMETRY PANEL
CARBOXYHEMOGLOBIN: 0.9 % (ref 0.5–1.5)
CARBOXYHEMOGLOBIN: 0.9 % (ref 0.5–1.5)
CARBOXYHEMOGLOBIN: 0.8 % (ref 0.5–1.5)
Carboxyhemoglobin: 0.9 % (ref 0.5–1.5)
METHEMOGLOBIN: 1.5 % (ref 0.0–1.5)
METHEMOGLOBIN: 1.7 % — AB (ref 0.0–1.5)
Methemoglobin: 0.7 % (ref 0.0–1.5)
Methemoglobin: 1.6 % — ABNORMAL HIGH (ref 0.0–1.5)
O2 Saturation: 53.2 %
O2 Saturation: 41.1 %
O2 Saturation: 45.6 %
O2 Saturation: 42.3 %
TOTAL HEMOGLOBIN: 10.2 g/dL — AB (ref 12.0–16.0)
Total hemoglobin: 9.8 g/dL — ABNORMAL LOW (ref 12.0–16.0)
Total hemoglobin: 10.2 g/dL — ABNORMAL LOW (ref 12.0–16.0)
Total hemoglobin: 10.1 g/dL — ABNORMAL LOW (ref 12.0–16.0)

## 2017-09-01 LAB — MRSA PCR SCREENING: MRSA BY PCR: NEGATIVE

## 2017-09-01 LAB — MAGNESIUM: Magnesium: 2.2 mg/dL (ref 1.7–2.4)

## 2017-09-01 LAB — PROTIME-INR
INR: 1.68
PROTHROMBIN TIME: 19.7 s — AB (ref 11.4–15.2)

## 2017-09-01 LAB — PROCALCITONIN: Procalcitonin: 4.85 ng/mL

## 2017-09-01 LAB — APTT: aPTT: 172 seconds (ref 24–36)

## 2017-09-01 MED ORDER — FUROSEMIDE 10 MG/ML IJ SOLN
120.0000 mg | Freq: Two times a day (BID) | INTRAMUSCULAR | Status: DC
  Administered 2017-09-01 – 2017-09-02 (×3): 120 mg via INTRAVENOUS
  Filled 2017-09-01: qty 2
  Filled 2017-09-01: qty 10
  Filled 2017-09-01: qty 2

## 2017-09-01 MED ORDER — SODIUM CHLORIDE 0.9 % IV SOLN
1.0000 g | INTRAVENOUS | Status: DC
  Administered 2017-09-01 – 2017-09-03 (×3): 1 g via INTRAVENOUS
  Filled 2017-09-01 (×3): qty 10

## 2017-09-01 MED ORDER — HYDROCORTISONE 20 MG PO TABS
20.0000 mg | ORAL_TABLET | Freq: Two times a day (BID) | ORAL | Status: AC
  Administered 2017-09-01 – 2017-09-02 (×3): 20 mg via ORAL
  Filled 2017-09-01 (×4): qty 1

## 2017-09-01 MED ORDER — METOLAZONE 2.5 MG PO TABS
2.5000 mg | ORAL_TABLET | Freq: Once | ORAL | Status: AC
  Administered 2017-09-01: 2.5 mg via ORAL
  Filled 2017-09-01: qty 1

## 2017-09-01 MED ORDER — APIXABAN 2.5 MG PO TABS
2.5000 mg | ORAL_TABLET | Freq: Two times a day (BID) | ORAL | Status: DC
  Administered 2017-09-01 – 2017-09-07 (×13): 2.5 mg via ORAL
  Filled 2017-09-01 (×13): qty 1

## 2017-09-01 MED ORDER — POTASSIUM CHLORIDE CRYS ER 20 MEQ PO TBCR
20.0000 meq | EXTENDED_RELEASE_TABLET | Freq: Once | ORAL | Status: AC
  Administered 2017-09-01: 20 meq via ORAL
  Filled 2017-09-01: qty 1

## 2017-09-01 NOTE — Progress Notes (Signed)
CRITICAL VALUE ALERT  Critical Value:  PTT 172  Date & Time Notied:  09/01/17 at 0630  Provider Notified: Primitivo GauzeElink- Ronashadran  Orders Received/Actions taken: none

## 2017-09-01 NOTE — Progress Notes (Signed)
Follow-up co-ox result: 41.1 - [place]. Verbal order received to increase Milrinone to 0.25 from 0.125. Patient education provided and questions answered.

## 2017-09-01 NOTE — Progress Notes (Signed)
[facility] Progress Note    Assessment/ Plan:   1. AoCKD3; follows at our office (prev [facility] [place], no visit since his retirement) SCr was 1.6 11/2016.  Renal US with small, shrunken kidneys, L > R.  He does not have lots of reserve; Cr continues to inch upwards and is 3.2 today; but this is not entirely unexpected in the setting of hypotension after TEE and what I would expect to be reduced effective arterial blood volume in setting of decompensated CHF and Aflutter.  Of note, he is a poor iHD candidate at present based on his suboptimal hemodynamics and severely depressed EF. Fortunately there are no immediate needs for dialysis at this time.  He is still making some urine.  2. Acute systolic CHF: EF 08-14%15-20% by TEE, severely dilated LA.  Advanced CHF consulted- on milrinone, Co-ox's are low. High-dose Lasix.    3. Aflutter: DCCV unsuccessful, now back in NSR on amio gtt, continue on hep gtt.  Per cardiology 4. CAP: s/p azithromycin but increasing infiltrate- likely will need broadening 5. Elevated TSH: TSH > 10 6/17-- per primary 6. Elevated LFTs: I suspect shock liver/ possible congestive hepatopathy, per primary 7. Mild metabolic acidosis:  After hypotension, now is resolved 8. Dispo: in ICU    Subjective:    Low-output HF.  On milrinone and now high-dose Lasix.  CXR with increased infiltrate LUL.  Having some UOP, Cr now 3.2.   Objective:   BP 119/67   Pulse 68   Temp 97.7 F (36.5 C) (Oral)   Resp 12   Ht 5\' 2"  (1.575 m)   Wt 50.4 kg (111 lb 1.8 oz)   SpO2 94%   BMI 20.32 kg/m   Intake/Output Summary (Last 24 hours) at 09/01/2017 [id number] Last data filed at 09/01/2017 0000 Gross per 24 hour  Intake 244.19 ml  Output 1265 ml  Net -1020.81 ml   Weight change:   Physical Exam: GEN:No acute distress, sitting upright in bed HUD:JSHFENT:NCAT, JVD to angle of mandible upright- prominent JVP EYES:EOMI WY:OVZCHYIFOYDCV:Tachycardic, regular today, no rub PULM:L > R inspiratory  crackles XAJ:OINOABD:Soft, nontender, no suprapubic distention or tenderness, bowel sounds present GU: Foley placed, clear urine SKIN:heavily tattooed EXT:No edema    Imaging: Dg Chest Port 1 View  Result Date: 09/01/2017 CLINICAL DATA:  Shortness of breath. History of coronary artery disease, acute on chronic respiratory failure, COPD, current smoker. EXAM: PORTABLE CHEST 1 VIEW COMPARISON:  Portable chest x-ray of August 31, 2017 FINDINGS: The lungs remain hyperinflated. The interstitial markings remain increased bilaterally with areas of near confluence in both upper lobes. The cardiac silhouette is enlarged. The central pulmonary vascularity is prominent but there is no definite cephalization. There are post CABG changes. There is calcification in the wall of the aortic arch. The right internal jugular venous catheter tip projects over the distal third of the SVC. IMPRESSION: Stable appearance of the chest. COPD. Bilateral pneumonia greatest in the left upper lobe. No CHF. Thoracic aortic atherosclerosis. Electronically Signed   By: David  SwazilandJordan M.D.   On: 09/01/2017 08:04   Dg Chest Port 1 View  Result Date: 08/31/2017 CLINICAL DATA:  Shortness of breath.  Central line placement. EXAM: PORTABLE CHEST 1 VIEW COMPARISON:  Radiograph of August 31, 2017. FINDINGS: Stable cardiomediastinal silhouette. Status post coronary artery bypass graft. Stable left upper lobe opacity is noted concerning for pneumonia. No pneumothorax or significant pleural effusion is noted. Interval placement of right internal jugular catheter with distal tip in expected  position of the SVC. Bony thorax is unremarkable. IMPRESSION: Interval placement of right internal jugular catheter with distal tip in expected position of the SVC. No pneumothorax is noted. Stable left upper lobe opacity is noted concerning for pneumonia. Followup PA and lateral chest X-ray is recommended in 3-4 weeks following trial of antibiotic therapy to ensure  resolution and exclude underlying malignancy. Electronically Signed   By: Lupita Raider, M.D.   On: 08/31/2017 12:05   Dg Chest Port 1 View  Result Date: 08/31/2017 CLINICAL DATA:  Shortness of breath EXAM: PORTABLE CHEST 1 VIEW COMPARISON:  08/30/2017 FINDINGS: Cardiac shadow is enlarged but stable. Postsurgical changes are again seen. There are diffuse bilateral infiltrative densities identified somewhat increased from the prior exam particularly on the left. No sizable effusion is seen. No acute bony abnormality is noted. IMPRESSION: Increasing left-sided infiltrates. Electronically Signed   By: Alcide Clever M.D.   On: 08/31/2017 07:11   Dg Chest Port 1 View  Result Date: 08/30/2017 CLINICAL DATA:  Shortness of breath and chest pressure EXAM: PORTABLE CHEST 1 VIEW COMPARISON:  08/27/2017 FINDINGS: Cardiac shadow is enlarged. Postsurgical changes are again identified and stable. Diffuse bilateral infiltrative changes are again identified and stable from the prior exam. No new focal infiltrate or sizable effusion is seen. No pneumothorax is seen. Bony structures are stable. IMPRESSION: No change from the prior exam. Electronically Signed   By: Alcide Clever M.D.   On: 08/30/2017 19:50   [place] Ekg Site Rite  Result Date: 08/30/2017 If Site Rite image not attached, placement could not be confirmed due to current cardiac rhythm.   Labs: BMET Recent Labs  Lab 08/28/17 1513 08/28/17 2238 08/29/17 0601 08/30/17 0438 08/30/17 1941 08/31/17 0313 09/01/17 0455  NA 140 139 140 139 142 139 135  K 5.4* 3.9 3.9 4.4 5.0 4.4 3.8  CL 104 102 104 101 107 103 101  CO2 25 23 25 26  19* 20* 22  GLUCOSE 184* 168* 154* 134* 114* 133* 165*  BUN 54* 60* 60* 79* 84* 87* 92*  CREATININE 2.64* 2.57* 2.46* 2.71* 2.96* 3.03* 3.17*  CALCIUM 9.3 9.2 9.3 9.3 8.8* 8.2* 8.4*  PHOS  --   --   --   --   --   --  6.4*   CBC Recent Labs  Lab 08/27/17 1555 08/30/17 1710 08/31/17 0313 09/01/17 0455  WBC 10.6* 14.2*  15.1* 12.2*  NEUTROABS  --   --  13.2* 10.6*  HGB 12.5* 12.7* 10.4* 9.6*  HCT 39.1 40.7 32.8* 29.7*  MCV 98.0 101.0* 99.1 96.1  PLT 366 358 265 254    Medications:    . apixaban  2.5 mg Oral BID  . aspirin  81 mg Oral Daily  . Chlorhexidine Gluconate Cloth  6 each Topical Daily  . hydrocortisone  20 mg Oral BID  . insulin aspart  0-5 Units Subcutaneous QHS  . insulin aspart  0-9 Units Subcutaneous TID WC  . nicotine  21 mg Transdermal Daily  . pantoprazole  40 mg Oral Daily  . sodium chloride flush  10-40 mL Intracatheter Q12H      Bufford Buttner, MD 09/01/2017, 9:23 AM

## 2017-09-01 NOTE — Progress Notes (Signed)
Patient ID: Derrick Marsh, male   DOB: [birth year], 72 y.o.   MRN: [id number]     Advanced Heart Failure Rounding Note  PCP-Cardiologist: No primary care provider on file.   Subjective:    Milrinone started 6/18 with low output.  HR elevated in atrial fibrillation overnight, so decreased to 0.125 mcg/kg/min.  Early am co-ox 53%. He is now back in NSR on amiodarone gtt.  SBP higher now 120s-130s.  Some UOP but not vigorous.  CVP 19-20 on my measure.   CXR has shown LUL infiltrate.  Afebrile but PCT elevated at 4.85.  He is off abx (completed 5 days of azithromycin).   He feels better this morning, no dyspnea or chest pain.    Objective:   Weight Range: 111 lb 1.8 oz (50.4 kg) Body mass index is 20.32 kg/m.   Vital Signs:   Temp:  [97.7 F (36.5 C)-98.2 F (36.8 C)] 97.7 F (36.5 C) (06/19 0400) Pulse Rate:  [57-154] 68 (06/19 0700) Resp:  [11-22] 12 (06/19 0700) BP: (90-138)/(54-97) 119/67 (06/19 0700) SpO2:  [94 %-100 %] 94 % (06/19 0700) Last BM Date: 08/30/17  Weight change: Filed Weights   08/30/17 0543 08/30/17 1225 08/31/17 0500  Weight: 103 lb 6.4 oz (46.9 kg) 103 lb 6.4 oz (46.9 kg) 111 lb 1.8 oz (50.4 kg)    Intake/Output:   Intake/Output Summary (Last 24 hours) at 09/01/2017 0740 Last data filed at 09/01/2017 0000 Gross per 24 hour  Intake 258.24 ml  Output 1395 ml  Net -1136.76 ml      Physical Exam    General:  Well appearing. No resp difficulty HEENT: Normal Neck: Supple. JVP 16. No lymphadenopathy or thyromegaly appreciated. Cor: PMI nondisplaced. Regular rate & rhythm. No rubs, gallops or murmurs. Lungs: Rhonchi diffusely.  Abdomen: Soft, nontender, nondistended. No hepatosplenomegaly. No bruits or masses. Good bowel sounds. Extremities: No cyanosis, clubbing, rash, edema Neuro: Alert & orientedx3, cranial nerves grossly intact. moves all 4 extremities w/o difficulty. Affect pleasant   Telemetry   NSR with PACs (personally reviewed)  Labs    CBC Recent Labs    08/31/17 0313 09/01/17 0455  WBC 15.1* 12.2*  NEUTROABS 13.2* 10.6*  HGB 10.4* 9.6*  HCT 32.8* 29.7*  MCV 99.1 96.1  PLT 265 254   Basic Metabolic Panel Recent Labs    40/98/1106/18/19 0313 09/01/17 0455  NA 139 135  K 4.4 3.8  CL 103 101  CO2 20* 22  GLUCOSE 133* 165*  BUN 87* 92*  CREATININE 3.03* 3.17*  CALCIUM 8.2* 8.4*  MG 2.2 2.2  PHOS  --  6.4*   Liver Function Tests Recent Labs    08/31/17 0313 09/01/17 0455  AST 523* 192*  ALT 592* 433*  ALKPHOS 93 95  BILITOT 1.1 0.8  PROT 5.9* 6.2*  ALBUMIN 3.4* 3.5   No results for input(s): LIPASE, AMYLASE in the last 72 hours. Cardiac Enzymes Recent Labs    08/30/17 1710  TROPONINI 0.06*    BNP: BNP (last 3 results) Recent Labs    08/27/17 2239 08/31/17 0313  BNP 3,310.3* 1,505.7*    ProBNP (last 3 results) No results for input(s): PROBNP in the last 8760 hours.   D-Dimer No results for input(s): DDIMER in the last 72 hours. Hemoglobin A1C No results for input(s): HGBA1C in the last 72 hours. Fasting Lipid Panel No results for input(s): CHOL, HDL, LDLCALC, TRIG, CHOLHDL, LDLDIRECT in the last 72 hours. Thyroid Function Tests Recent Labs  08/30/17 1842  TSH 10.501*    Other results:   Imaging    Dg Chest Port 1 View  Result Date: 08/31/2017 CLINICAL DATA:  Shortness of breath.  Central line placement. EXAM: PORTABLE CHEST 1 VIEW COMPARISON:  Radiograph of August 31, 2017. FINDINGS: Stable cardiomediastinal silhouette. Status post coronary artery bypass graft. Stable left upper lobe opacity is noted concerning for pneumonia. No pneumothorax or significant pleural effusion is noted. Interval placement of right internal jugular catheter with distal tip in expected position of the SVC. Bony thorax is unremarkable. IMPRESSION: Interval placement of right internal jugular catheter with distal tip in expected position of the SVC. No pneumothorax is noted. Stable left upper lobe  opacity is noted concerning for pneumonia. Followup PA and lateral chest X-ray is recommended in 3-4 weeks following trial of antibiotic therapy to ensure resolution and exclude underlying malignancy. Electronically Signed   By: Lupita Raider, M.D.   On: 08/31/2017 12:05      Medications:     Scheduled Medications: . aspirin  81 mg Oral Daily  . Chlorhexidine Gluconate Cloth  6 each Topical Daily  . hydrocortisone  20 mg Oral BID  . insulin aspart  0-5 Units Subcutaneous QHS  . insulin aspart  0-9 Units Subcutaneous TID WC  . metolazone  2.5 mg Oral Once  . nicotine  21 mg Transdermal Daily  . pantoprazole  40 mg Oral Daily  . potassium chloride  20 mEq Oral Once  . sodium chloride flush  10-40 mL Intracatheter Q12H     Infusions: . amiodarone 30 mg/hr (09/01/17 0151)  . amiodarone 150 mg (08/31/17 2053)  . furosemide    . milrinone 0.125 mcg/kg/min (08/31/17 2200)  . norepinephrine (LEVOPHED) Adult infusion Stopped (08/31/17 1459)     PRN Medications:  acetaminophen, amiodarone, colchicine, dextromethorphan-guaiFENesin, ipratropium, levalbuterol, nitroGLYCERIN, ondansetron (ZOFRAN) IV, sodium chloride flush   Assessment/Plan   1. Acute systolic CHF/cardiogenic shock: Echo with EF 20-25%, moderate RV systolic dysfunction.  We do not know his baseline EF, no echo in system.  He has history of CABG, so ischemic cardiomyopathy is possible.  No chest pain.  TnI was mildly elevated at 0.09 but likely demand ischema with volume overload/hypotension/tachycardia.  Tachycardia-mediated cardiomyopathy is also possible, he was admitted in atypical flutter with RVR, unsure of how long he had been in it.  He was cardioverted on 6/17 but was back in atrial flutter with RVR 6/18.  He was hypotensive post-TEE 6/17, required intubation and phenylephrine.  He is now extubated and off phenylephrine.  He went back into NSR on amiodarone gtt.  Milrinone started with low co-ox, decreased to 0.125  with elevated HR in afib overnight. Co-ox 53% early am.  SBP up to 120s-130s in NSR.  Some UOP but not marked, CVP 19-20.  - Continue milrinone but resend co-ox this morning.  If low, would increase milrinone back to 0.25.   - Lasix 120 mg IV bid today with dose of metolazone now that BP better and in NSR. CVP quite high.  - May need formal RHC, will see how he responds to current measures.  - No ACEI/ARB/ARNI/digoxin/spironolactone with AKI, No beta blocker with low output.  - Cannot rule out progression of CAD with ischemic CMP though doubt ACS this admission.  Creatinine too high for cath though needs eventually if creatinine comes down.   - Not candidate for advanced therapies currently with AKI.  2. Atrial flutter: With RVR.  Atypical flutter.  DCCV  6/17 but did not hold, back in flutter with RVR 6/18.  He was admitted with aflutter/RVR, cannot rule out tachy-mediated CMP.  This morning, he is back in NSR on amiodarone gtt.  BP improved.  - Continue amiodarone gtt for now, need to keep in NSR. - Transition to Eliquis.  3. AKI: On suspected CKD stage 3.  Creatinine 1.6 in 2018.  Had small kidneys suggestive of medical renal disease on last [place]. Creatinine up to 3.17 today, suspect due to hypotension/low output/cardiorenal syndrome.  I am quite concerned by his renal function as his baseline is likely poor.  He would not be a very good HD candidate with CHF/low EF. Hopefully with milrinone and NSR, renal function will make sure improvement. 4. Elevated LFTs: Possible shock liver in setting of hypotension/low output.  LFTs trending down.  - Statin held for now.  - Will use amiodarone but will monitor LFTs closely.  5. CAD: s/p CABG 2000, no cath since that time.  See discussion above, cannot rule out ischemic cardiomyopathy but doubt ACS => TnI 0.06 with no trend.  6. COPD: Treating for COPD exacerbation with hydrocortisone/azithromycin.   - Can change hydrocortisone to po and wean.  - PCT higher  today, now off azithromycin.  He is afebrile.  PCT may be higher with AKI, but had a large jump from yesterday to today.  CXR with LUL infiltrate.  Will ask CCM to comment regarding ongoing antibiotic use.   CRITICAL CARE Performed by: Marca Ancona  Total critical care time: 35 minutes  Critical care time was exclusive of separately billable procedures and treating other patients.  Critical care was necessary to treat or prevent imminent or life-threatening deterioration.  Critical care was time spent personally by me on the following activities: development of treatment plan with patient and/or surrogate as well as nursing, discussions with consultants, evaluation of patient's response to treatment, examination of patient, obtaining history from patient or surrogate, ordering and performing treatments and interventions, ordering and review of laboratory studies, ordering and review of radiographic studies, pulse oximetry and re-evaluation of patient's condition. Length of Stay: 5  Marca Ancona, MD  09/01/2017, 7:40 AM  Advanced Heart Failure Team Pager [phone] (M-F; 7a - 4p)  Please contact [facility] for night-coverage after hours (4p -7a ) and weekends on [email or web address]

## 2017-09-01 NOTE — Progress Notes (Signed)
ANTICOAGULATION CONSULT NOTE  Pharmacy Consult for Heparin>>apixaban  Indication: atrial fibrillation  Allergies  Allergen Reactions  . Aleve [Naproxen] Other (See Comments)    Was told by a MD to not take this; conflicted with his other meds    Patient Measurements: Height: 5\' 2"  (157.5 cm) Weight: 111 lb 1.8 oz (50.4 kg) IBW/kg (Calculated) : 54.6 Heparin Dosing Weight: 48.3 kg  Vital Signs: Temp: 97.7 F (36.5 C) (06/19 0400) Temp Source: Oral (06/19 0400) BP: 119/67 (06/19 0700) Pulse Rate: 68 (06/19 0700)  Labs: Recent Labs    08/30/17 0438  08/30/17 1710 08/30/17 1941 08/31/17 0313 09/01/17 0455  HGB  --    < > 12.7*  --  10.4* 9.6*  HCT  --   --  40.7  --  32.8* 29.7*  PLT  --   --  358  --  265 254  APTT  --   --   --   --  89* 172*  LABPROT 15.2  --   --   --  18.6* 19.7*  INR 1.21  --   --   --  1.57 1.68  HEPARINUNFRC 0.42  --   --   --  0.30 0.70  CREATININE 2.71*  --   --  2.96* 3.03* 3.17*  TROPONINI  --   --  0.06*  --   --   --    < > = values in this interval not displayed.    Estimated Creatinine Clearance: 15 mL/min (A) (by C-G formula based on SCr of 3.17 mg/dL (H)).   Medical History: Past Medical History:  Diagnosis Date  . Allergic rhinitis   . Atherosclerosis of native arteries of the extremities with intermittent claudication   . 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 HTN (hypertension)   . Hypercholesteremia   . PVC's (premature ventricular contractions)   . Tobacco abuse     Assessment: 72 yo with PMH CAD w/ CABG (2000) p/w tachycardia and dyspnea. Pharmacy consulted to dose heparin/warfarin for afib. CBC low stable with no signs/symptoms of bleeding.   Heparin level this morning was therapeutic at 0.7, on 750 units/hr. Hgb 9.6, plt 254. No s/sx of bleeding.  Plan to transition to apixaban this morning. Given weight<60 kg and Scr 3.17, he  qualifies for reduced dosing.   Goal of Therapy:  Heparin level 0.3-0.7 units/ml Monitor platelets by anticoagulation protocol: Yes  Plan:  Discontinue heparin gtt  Start apixaban 2.5 mg twice daily  F/u s/sx bleeding, renal function, and CBC  Thank you for allowing us to participate in this patients care.  Girard CooterKimberly Perkins, PharmD Clinical Pharmacist  Pager: [phone] Phone: [phone] 09/01/2017 7:40 AM

## 2017-09-01 NOTE — Progress Notes (Addendum)
Patient went into Afib RVR at 140 bpm. Pt is asymptomatic. Dr. Shirlee LatchMcLean was notified and verbal orders were given to decrease milrinone drip to 0.14825mcg and to bolus 150mg  of Amiodarone. Amio bolus was stopped due to patient's HR dropping to mid 60's. Pt is now maintaining a rate of 110-120. Will continue to monitor.

## 2017-09-01 NOTE — Care Management (Signed)
Eliquis Benefit check 1. ELIQUIS 2.5 MG BID  COVER- YES  CO-PAY- $ 8.50  TIER- 3 DRUG  PRIOR APPROVAL- NO   2. ELIQUIS 5 MG BID  COVER- YES  CO-PAY- $ 8.50  TIER- 3 DRUG  PRIOR APPROVAL- NO   PREFERRED PHARMACY : YES [facility]

## 2017-09-01 NOTE — Plan of Care (Signed)

## 2017-09-01 NOTE — Progress Notes (Signed)
150 mg Amiodarone bolus given per Cala BradfordKimberly, Pharmacist.

## 2017-09-01 NOTE — Consult Note (Signed)
Consultation Note Date: 09/01/2017   Patient Name: Derrick Marsh  DOB: April 21, 1945  MRN: [id number]  Age / Sex: 72 y.o., male  PCP: Renford Dills, MD Referring Physician: Laurey Morale, MD  Reason for Consultation: Establishing goals of care and Psychosocial/spiritual support  HPI/Patient Profile: 72 y.o. male   admitted on 08/27/2017 with a past medical history of coronary artery disease, chronic stage pulmonary disease, peripheral artery disease, CKD/ unlikely candidate for HD     He was admitted on 6/14 chief complaint of shortness of breath, decreased exercise tolerance, dry nonproductive cough, increased exertional dyspnea.   Currently on IV Amiodarone, continued intermittent  AF, ablation being considered.    Patient faces treatment option decisions, advanced directive decisions and anticipatory care needs within the context of serious life limiting illness.    Clinical Assessment and Goals of Care:  This NP Lorinda Creed reviewed medical records, received report from team, assessed the patient and then meet at the patient's bedside   to discuss diagnosis, prognosis, GOC,  disposition and options.  We discussed the trajectory of CHF and ES renal disease.  We discussed concept of human mortality and limitation of medicine to prolong quality of life in many situations.  He understands the seriousness of his disease  Concept of Hospice and Palliative Care were discussed  A detailed discussion was had today regarding advanced directives.    Values and goals of care important to patient and his family were attempted to be elicited.  Questions and concerns addressed.   Patient  encouraged to call with questions or concerns.       PMT will continue to support holistically.  No documented HPOA --encouraged to secure while here in the hospital with the assistance of Spiritual Care  SUMMARY OF  RECOMMENDATIONS    Code Status/Advance Care Planning:  DNR   Additional Recommendations (Limitations, Scope, Preferences):  Full Scope Treatment   Patient is open to all offered and available medical interventions to prolong life at this time, he is hopeful for Improvement and return home  Psycho-social/Spiritual:   Desire for further Chaplaincy support:no- declined at this time  Additional Recommendations: Education on Hospice  Prognosis:  Unable to determine-  Discharge Planning: Patient is open to short term rehab if eligible prior to retuning home   To Be Determined      Primary Diagnoses: Present on Admission: . Essential hypertension . CAD, multiple vessel -s/p CABG . Hyperlipidemia with target LDL less than 70 . Tobacco abuse . Acute renal failure superimposed on stage 3 chronic kidney disease (HCC) . Acute on chronic respiratory failure with hypoxia (HCC) . COPD with acute exacerbation (HCC) . Lung nodule . Elevated troponin = likley demand ischemia . Dilated cardiomyopathy (HCC) . Acute combined systolic and diastolic heart failure (HCC) -complicated by rapid atrial flutter   I have reviewed the medical record, interviewed the patient and family, and examined the patient. The following aspects are pertinent.  Past Medical History:  Diagnosis Date  . Allergic rhinitis   .  Atherosclerosis of native arteries of the extremities with intermittent claudication   . 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 HTN (hypertension)   . Hypercholesteremia   . PVC's (premature ventricular contractions)   . Tobacco abuse    Social History   Socioeconomic History  . Marital status: Single    Spouse name: Not on file  . Number of children: Not on file  . Years of education: Not on file  . Highest education level: Not on file  Occupational History  . Not on file  Social Needs  .  Financial resource strain: Not on file  . Food insecurity:    Worry: Not on file    Inability: Not on file  . Transportation needs:    Medical: Not on file    Non-medical: Not on file  Tobacco Use  . Smoking status: Current Some Day Smoker    Packs/day: 0.50    Types: Cigars  . Smokeless tobacco: Never Used  Substance and Sexual Activity  . Alcohol use: No  . Drug use: No  . Sexual activity: Not on file  Lifestyle  . Physical activity:    Days per week: Not on file    Minutes per session: Not on file  . Stress: Not on file  Relationships  . Social connections:    Talks on phone: Not on file    Gets together: Not on file    Attends religious service: Not on file    Active member of club or organization: Not on file    Attends meetings of clubs or organizations: Not on file    Relationship status: Not on file  Other Topics Concern  . Not on file  Social History Narrative  . Not on file   Family History  Problem Relation Age of Onset  . Heart disease Father   . Emphysema Father   . CVA Father   . Heart failure Mother    Scheduled Meds: . apixaban  2.5 mg Oral BID  . aspirin  81 mg Oral Daily  . Chlorhexidine Gluconate Cloth  6 each Topical Daily  . hydrocortisone  20 mg Oral BID  . insulin aspart  0-5 Units Subcutaneous QHS  . insulin aspart  0-9 Units Subcutaneous TID WC  . nicotine  21 mg Transdermal Daily  . pantoprazole  40 mg Oral Daily  . sodium chloride flush  10-40 mL Intracatheter Q12H   Continuous Infusions: . amiodarone 60 mg/hr (09/01/17 1434)  . cefTRIAXone (ROCEPHIN)  IV 1 g (09/01/17 0844)  . furosemide 120 mg (09/01/17 1021)  . milrinone 0.25 mcg/kg/min (09/01/17 1028)  . norepinephrine (LEVOPHED) Adult infusion Stopped (08/31/17 1459)   PRN Meds:.acetaminophen, colchicine, dextromethorphan-guaiFENesin, ipratropium, levalbuterol, nitroGLYCERIN, ondansetron (ZOFRAN) IV, sodium chloride flush Medications Prior to Admission:  Prior to Admission  medications   Medication Sig Start Date End Date Taking? Authorizing Provider  aspirin EC 81 MG tablet Take 81 mg by mouth daily.   Yes [provider]  atorvastatin (LIPITOR) 40 MG tablet TAKE 1 TABLET EVERY DAY 01/11/17  Yes Turner, Cornelious Bryant, MD  colchicine 0.6 MG tablet Take 0.6 mg by mouth daily as needed (for gout flares).    Yes [provider]  metoprolol tartrate (LOPRESSOR) 25 MG tablet TAKE 1/2 TABLET BY MOUTH TWICE DAILY 12/28/16  Yes Quintella Reichert, MD   Allergies  Allergen Reactions  .  Aleve [Naproxen] Other (See Comments)    Was told by a MD to not take this; conflicted with his other meds   Review of Systems  Constitutional: Positive for fatigue.  Neurological: Positive for weakness.    Physical Exam  Constitutional: He is oriented to person, place, and time. He appears well-developed. He appears ill. Nasal cannula in place.  Cardiovascular: Tachycardia present.  Musculoskeletal:  generalized weakness  Neurological: He is oriented to person, place, and time.  Skin: Skin is warm and dry.    Vital Signs: BP 130/71   Pulse (!) 118   Temp 97.6 F (36.4 C) (Oral)   Resp 19   Ht 5\' 2"  (1.575 m)   Wt 50.4 kg (111 lb 1.8 oz)   SpO2 94%   BMI 20.32 kg/m  Pain Scale: 0-10   Pain Score: 0-No pain   SpO2: SpO2: 94 % O2 Device:SpO2: 94 % O2 Flow Rate: .O2 Flow Rate (L/min): 3 L/min  IO: Intake/output summary:   Intake/Output Summary (Last 24 hours) at 09/01/2017 1706 Last data filed at 09/01/2017 1600 Gross per 24 hour  Intake 1538.95 ml  Output 1895 ml  Net -356.05 ml    LBM: Last BM Date: 09/01/17 Baseline Weight: Weight: 48.3 kg (106 lb 8 oz)(Scale C) Most recent weight: Weight: 50.4 kg (111 lb 1.8 oz)     Palliative Assessment/Data: 60%   Discussed with Hassell DoneAndy Tilley NP with Heart Failure Team  Time In: 1445 Time Out: 1600 Time Total: 75 minutes Greater than 50%  of this time was spent counseling and coordinating care related to  the above assessment and plan.  Signed by: Lorinda CreedMary Samul Mcinroy, NP   Please contact Palliative Medicine Team phone at [phone] for questions and concerns.  For individual provider: See Loretha StaplerAmion

## 2017-09-01 NOTE — Discharge Instructions (Signed)

## 2017-09-01 NOTE — Progress Notes (Signed)
Patient went from NSR to Afib RVR of 155. BP of 107/95. Pt is asymptomatic.150 mg bolus of Amio was given. The attending (Andy)was notified and is aware of the bolus given. Will continue to monitor.

## 2017-09-01 NOTE — Progress Notes (Signed)
Will hold ambulation today due to uncontrolled rate/afib. Will f/u tomorrow. Ethelda ChickKristan Breeanne Oblinger CES, ACSM 2:15 PM 09/01/2017

## 2017-09-02 DIAGNOSIS — Z66 Do not resuscitate: Secondary | ICD-10-CM

## 2017-09-02 DIAGNOSIS — Z515 Encounter for palliative care: Secondary | ICD-10-CM

## 2017-09-02 LAB — CBC WITH DIFFERENTIAL/PLATELET
Abs Immature Granulocytes: 0.1 10*3/uL (ref 0.0–0.1)
BASOS PCT: 0 %
Basophils Absolute: 0 10*3/uL (ref 0.0–0.1)
EOS ABS: 0 10*3/uL (ref 0.0–0.7)
EOS PCT: 0 %
HEMATOCRIT: 30.1 % — AB (ref 39.0–52.0)
Hemoglobin: 9.9 g/dL — ABNORMAL LOW (ref 13.0–17.0)
Immature Granulocytes: 1 %
LYMPHS ABS: 0.5 10*3/uL — AB (ref 0.7–4.0)
Lymphocytes Relative: 3 %
MCH: 30.8 pg (ref 26.0–34.0)
MCHC: 32.9 g/dL (ref 30.0–36.0)
MCV: 93.8 fL (ref 78.0–100.0)
Monocytes Absolute: 1.9 10*3/uL — ABNORMAL HIGH (ref 0.1–1.0)
Monocytes Relative: 13 %
NEUTROS PCT: 83 %
Neutro Abs: 11.9 10*3/uL — ABNORMAL HIGH (ref 1.7–7.7)
PLATELETS: 244 10*3/uL (ref 150–400)
RBC: 3.21 MIL/uL — AB (ref 4.22–5.81)
RDW: 14.5 % (ref 11.5–15.5)
WBC: 14.3 10*3/uL — AB (ref 4.0–10.5)

## 2017-09-02 LAB — COOXEMETRY PANEL
CARBOXYHEMOGLOBIN: 1.1 % (ref 0.5–1.5)
Methemoglobin: 1.5 % (ref 0.0–1.5)
O2 SAT: 67.4 %
TOTAL HEMOGLOBIN: 9.9 g/dL — AB (ref 12.0–16.0)

## 2017-09-02 LAB — GLUCOSE, CAPILLARY
GLUCOSE-CAPILLARY: 161 mg/dL — AB (ref 65–99)
GLUCOSE-CAPILLARY: 102 mg/dL — AB (ref 65–99)
GLUCOSE-CAPILLARY: 151 mg/dL — AB (ref 65–99)
Glucose-Capillary: 143 mg/dL — ABNORMAL HIGH (ref 65–99)

## 2017-09-02 LAB — CULTURE, BLOOD (ROUTINE X 2)
Culture: NO GROWTH
Culture: NO GROWTH
SPECIAL REQUESTS: ADEQUATE

## 2017-09-02 LAB — BASIC METABOLIC PANEL
Anion gap: 15 (ref 5–15)
BUN: 86 mg/dL — AB (ref 6–20)
CO2: 28 mmol/L (ref 22–32)
CREATININE: 3.08 mg/dL — AB (ref 0.61–1.24)
Calcium: 9.1 mg/dL (ref 8.9–10.3)
Chloride: 93 mmol/L — ABNORMAL LOW (ref 101–111)
GFR, EST AFRICAN AMERICAN: 22 mL/min — AB (ref >60)
GFR, EST NON AFRICAN AMERICAN: 19 mL/min — AB (ref >60)
Glucose, Bld: 146 mg/dL — ABNORMAL HIGH (ref 65–99)
Potassium: 3.3 mmol/L — ABNORMAL LOW (ref 3.5–5.1)
SODIUM: 136 mmol/L (ref 135–145)

## 2017-09-02 LAB — HEPARIN LEVEL (UNFRACTIONATED): Heparin Unfractionated: >2.2 IU/mL — ABNORMAL HIGH (ref 0.30–0.70)

## 2017-09-02 MED ORDER — POTASSIUM CHLORIDE CRYS ER 20 MEQ PO TBCR
40.0000 meq | EXTENDED_RELEASE_TABLET | Freq: Once | ORAL | Status: AC
  Administered 2017-09-02: 40 meq via ORAL
  Filled 2017-09-02: qty 2

## 2017-09-02 MED ORDER — TORSEMIDE 20 MG PO TABS
60.0000 mg | ORAL_TABLET | Freq: Every day | ORAL | Status: DC
  Administered 2017-09-03 – 2017-09-04 (×2): 60 mg via ORAL
  Filled 2017-09-02 (×2): qty 3

## 2017-09-02 NOTE — Evaluation (Signed)
Physical Therapy Evaluation Patient Details Name: Derrick Marsh MRN: [id number] DOB: Jun 06, 1945 Today's Date: 09/02/2017   History of Present Illness  Derrick Marsh is a 72 y.o. male with medical history significant of hypertension, hyperlipidemia, COPD, gout, CAD, CABG, PAD, tobacco abuse, PVC, who presents with shortness of breath.    Clinical Impression  Pt admitted with above diagnosis. Pt currently with functional limitations due to the deficits listed below (see PT Problem List). PTA, pt living alone in 1 level home with 1 step to enter, independent with all mobility and ADLs. Upon eval pt presents with mild unsteadiness and weakness, ambulating 200' with RW this visit on 4L. No overt LOB, feel pt will progress to be safe to return to home.  Sat  Pt will benefit from skilled PT to increase their independence and safety with mobility to allow discharge to the venue listed below.    Vitals: n HR 118-128 with activity.  BP: 124/74 after activity SpO2: 90% on 4L    Follow Up Recommendations Home health PT    Equipment Recommendations  None recommended by PT    Recommendations for Other Services       Precautions / Restrictions Precautions Precaution Comments: watch O2  Restrictions Weight Bearing Restrictions: No      Mobility  Bed Mobility Overal bed mobility: Modified Independent                Transfers Overall transfer level: Needs assistance Equipment used: Rolling walker (2 wheeled) Transfers: Sit to/from Stand Sit to Stand: Min guard         General transfer comment: min guard for safety, line mgt with RW  Ambulation/Gait Ambulation/Gait assistance: Min guard   Assistive device: Rolling walker (2 wheeled) Gait Pattern/deviations: Step-to pattern;Step-through pattern Gait velocity: decreased   General Gait Details: pt ambulating hallway with RW, chair follow on 4L satting well. HR176max  Stairs            Wheelchair Mobility     Modified Rankin (Stroke Patients Only)       Balance Overall balance assessment: Needs assistance   Sitting balance-Leahy Scale: Good       Standing balance-Leahy Scale: Fair                               Pertinent Vitals/Pain Pain Assessment: No/denies pain    Home Living Family/patient expects to be discharged to:: Private residence Living Arrangements: Alone Available Help at Discharge: Family;Available PRN/intermittently;Friend(s) Type of Home: House Home Access: Stairs to enter Entrance Stairs-Rails: Can reach both Entrance Stairs-Number of Steps: 3 Home Layout: One level Home Equipment: Walker - 2 wheels;Grab bars - toilet;Grab bars - tub/shower;Cane - single point      Prior Function Level of Independence: Independent with assistive device(s)         Comments: indepdnent with ADLs, ambulates without AD usually sometimes SPC     Hand Dominance        Extremity/Trunk Assessment   Upper Extremity Assessment Upper Extremity Assessment: Overall WFL for tasks assessed    Lower Extremity Assessment Lower Extremity Assessment: Overall WFL for tasks assessed       Communication   Communication: No difficulties  Cognition Arousal/Alertness: Awake/alert Behavior During Therapy: WFL for tasks assessed/performed Overall Cognitive Status: Within Functional Limits for tasks assessed  General Comments      Exercises General Exercises - Lower Extremity Long Arc Quad: 10 reps   Assessment/Plan    PT Assessment Patient needs continued PT services  PT Problem List Decreased strength;Decreased activity tolerance;Cardiopulmonary status limiting activity       PT Treatment Interventions DME instruction;Gait training;Stair training;Functional mobility training;Therapeutic activities;Therapeutic exercise;Balance training    PT Goals (Current goals can be found in the Care Plan section)   Acute Rehab PT Goals PT Goal Formulation: With patient Time For Goal Achievement: 09/09/17 Potential to Achieve Goals: Good    Frequency Min 3X/week   Barriers to discharge        Co-evaluation               AM-PAC PT "6 Clicks" Daily Activity  Outcome Measure Difficulty turning over in bed (including adjusting bedclothes, sheets and blankets)?: None Difficulty moving from lying on back to sitting on the side of the bed? : None Difficulty sitting down on and standing up from a chair with arms (e.g., wheelchair, bedside commode, etc,.)?: None Help needed moving to and from a bed to chair (including a wheelchair)?: A Little Help needed walking in hospital room?: A Little Help needed climbing 3-5 steps with a railing? : A Little 6 Click Score: 21    End of Session Equipment Utilized During Treatment: Gait belt Activity Tolerance: Patient tolerated treatment well Patient left: in bed;with call bell/phone within reach Nurse Communication: Mobility status PT Visit Diagnosis: Unsteadiness on feet (R26.81);Difficulty in walking, not elsewhere classified (R26.2)    Time: [id number] PT Time Calculation (min) (ACUTE ONLY): 34 min   Charges:   PT Evaluation $PT Eval Low Complexity: 1 Low PT Treatments $Gait Training: 8-22 mins   PT G Codes:        Etta GrandchildSean Simon Aaberg, PT, DPT Acute Rehab Services Pager: [phone]    Etta GrandchildSean Shaquandra Galano 09/02/2017, 1:42 PM

## 2017-09-02 NOTE — Progress Notes (Signed)
CARDIAC REHAB PHASE I   PRE:  Rate/Rhythm: 71 SR    BP: sitting 120/68    SaO2: 98 4L  MODE:  Ambulation: 290 ft   POST:  Rate/Rhythm: 112 aflutter    BP: sitting 124/82     SaO2: 93 3L  Pt used RW, slow, steady pace. Assist x1 with gait belt for safety. Did not c/o SOB, was able to pace himself. Began on 2L. SaO2 89-90 2L and HR increasing so increased to 3L. Pt in aflutter after walk (had been SR before walking). To bed. Will f/u, left on 2L O2. [id number]   Harriet MassonRandi Kristan Vanesa Renier CES, ACSM 09/02/2017 2:18 PM

## 2017-09-02 NOTE — Care Management Note (Signed)
Case Management Note  Patient Details  Name: Derrick Marsh MRN: [id number] Date of Birth: [birth year]  Subjective/Objective:  From home,  NCM spoke with patient , he was not ready to choose agency for HHPT, NCM left [place] agency list with patient and gave him the eliquis 30 day free coupon card and his co pay amt of 8.50 for refills.                 Action/Plan: DC home with HHPT when ready, will need HHPT order.  Expected Discharge Date:                  Expected Discharge Plan:  Home w Home Health Services  In-House Referral:  Clinical Social Work  Discharge planning Services  CM Consult, Medication Assistance  Post Acute Care Choice:  Home Health Choice offered to:  Patient  DME Arranged:    DME Agency:     HH Arranged:    HH Agency:     Status of Service:  In process, will continue to follow  If discussed at Long Length of Stay Meetings, dates discussed:    Additional Comments:  Leone Havenaylor, Takhia Spoon [place], RN 09/02/2017, 4:07 PM

## 2017-09-02 NOTE — Progress Notes (Signed)
Patient ID: Derrick Marsh, male   DOB: [birth year], 72 y.o.   MRN: [id number]  This NP visited patient at the bedside as a follow up to  yesterday's GOCs meeting for palliative medicine needs and emotional support.  Daughter at bedside from out of town, she lives three hours away.  Continued conversation regarding current medical situation, importance of advanced care planning and anticipatory care needs.  Discussed with patient and his daughter the importance of continued conversation within  Family,  and their  medical providers regarding overall plan of care and treatment options,  ensuring decisions are within the context of the patients values and GOCs.  Patient is hopeful that once stable to return home with the help of his friends and neighbors.  I worry that his care needs are greater that he anticipates.  Questions and concerns addressed   Total time spent on the unit was  25 minutes  Greater than 50% of the time was spent in counseling and coordination of care  Derrick CreedMary Larach NP  Palliative Medicine Team Team Phone # [phone] Pager [phone]

## 2017-09-02 NOTE — Progress Notes (Signed)
[facility] Progress Note    Assessment/ Plan:   1. AoCKD3; follows at our office (prev [facility], no visit since his retirement) SCr was 1.6 11/2016.  Renal US with small, shrunken kidneys, L > R.  He does not have lots of reserve. Cr improving with improved cardiac output and more effective diuresis.  Of note, he is a poor iHD candidate at present based on his suboptimal hemodynamics and severely depressed EF. Fortunately there are no immediate needs for dialysis at this time.  Will continue to follow.  2. Acute systolic CHF: EF 09-81% by TEE, severely dilated LA.  Advanced CHF consulted- on milrinone, Co-ox's are low. High-dose Lasix.   Per notes, to transition to torsemide tomorrow.  3. Aflutter: DCCV unsuccessful, now back in NSR on amio gtt, continue on hep gtt.  Per cardiology/ AHF.  Possible ablation in future.  4. CAP: s/p azithromycin but increasing infiltrate- on CTX now (6/19)  5. Elevated TSH: TSH > 10 6/17-- per primary  6. Elevated LFTs: I suspect shock liver/ possible congestive hepatopathy, per primary, improving  7. Mild metabolic acidosis:  After hypotension, now is resolved  8. Dispo: in ICU    Subjective:    Co-ox improved to 67 this AM.  In setting of improved forward flow, robust diuresis achieved, Cr slightly down.  CVP 8-9.     Objective:   BP 126/72   Pulse 86   Temp 97.7 F (36.5 C) (Oral)   Resp 16   Ht 5\' 2"  (1.575 m)   Wt 48.4 kg (106 lb 11.2 oz)   SpO2 93%   BMI 19.52 kg/m   Intake/Output Summary (Last 24 hours) at 09/02/2017 0846 Last data filed at 09/02/2017 0600 Gross per 24 hour  Intake 1484.73 ml  Output 4050 ml  Net -2565.27 ml   Weight change:   Physical Exam: GEN:No acute distress, sitting upright in bed XBJ:YNWG, JVD improving EYES:EOMI NF:AOZHYQMVHQI, regular today, no rub PULM:L > R inspiratory crackles, slightly improved ONG:EXBM, nontender, no suprapubic distention or tenderness, bowel sounds  present GU: Foley placed, clear urine SKIN:heavily tattooed EXT:No edema    Imaging: Dg Chest Port 1 View  Result Date: 09/01/2017 CLINICAL DATA:  Shortness of breath. History of coronary artery disease, acute on chronic respiratory failure, COPD, current smoker. EXAM: PORTABLE CHEST 1 VIEW COMPARISON:  Portable chest x-ray of August 31, 2017 FINDINGS: The lungs remain hyperinflated. The interstitial markings remain increased bilaterally with areas of near confluence in both upper lobes. The cardiac silhouette is enlarged. The central pulmonary vascularity is prominent but there is no definite cephalization. There are post CABG changes. There is calcification in the wall of the aortic arch. The right internal jugular venous catheter tip projects over the distal third of the SVC. IMPRESSION: Stable appearance of the chest. COPD. Bilateral pneumonia greatest in the left upper lobe. No CHF. Thoracic aortic atherosclerosis. Electronically Signed   By: David  [place] M.D.   On: 09/01/2017 08:04   Dg Chest Port 1 View  Result Date: 08/31/2017 CLINICAL DATA:  Shortness of breath.  Central line placement. EXAM: PORTABLE CHEST 1 VIEW COMPARISON:  Radiograph of August 31, 2017. FINDINGS: Stable cardiomediastinal silhouette. Status post coronary artery bypass graft. Stable left upper lobe opacity is noted concerning for pneumonia. No pneumothorax or significant pleural effusion is noted. Interval placement of right internal jugular catheter with distal tip in expected position of the SVC. Bony thorax is unremarkable. IMPRESSION: Interval placement of right internal jugular catheter  with distal tip in expected position of the SVC. No pneumothorax is noted. Stable left upper lobe opacity is noted concerning for pneumonia. Followup PA and lateral chest X-ray is recommended in 3-4 weeks following trial of antibiotic therapy to ensure resolution and exclude underlying malignancy. Electronically Signed   By: Lupita RaiderJames  Green  Jr, M.D.   On: 08/31/2017 12:05    Labs: BMET Recent Labs  Lab 08/28/17 2238 08/29/17 0601 08/30/17 0438 08/30/17 1941 08/31/17 0313 09/01/17 0455 09/02/17 0500  NA 139 140 139 142 139 135 136  K 3.9 3.9 4.4 5.0 4.4 3.8 3.3*  CL 102 104 101 107 103 101 93*  CO2 23 25 26  19* 20* 22 28  GLUCOSE 168* 154* 134* 114* 133* 165* 146*  BUN 60* 60* 79* 84* 87* 92* 86*  CREATININE 2.57* 2.46* 2.71* 2.96* 3.03* 3.17* 3.08*  CALCIUM 9.2 9.3 9.3 8.8* 8.2* 8.4* 9.1  PHOS  --   --   --   --   --  6.4*  --    CBC Recent Labs  Lab 08/30/17 1710 08/31/17 0313 09/01/17 0455 09/02/17 0500  WBC 14.2* 15.1* 12.2* 14.3*  NEUTROABS  --  13.2* 10.6* 11.9*  HGB 12.7* 10.4* 9.6* 9.9*  HCT 40.7 32.8* 29.7* 30.1*  MCV 101.0* 99.1 96.1 93.8  PLT 358 265 254 244    Medications:    . apixaban  2.5 mg Oral BID  . Chlorhexidine Gluconate Cloth  6 each Topical Daily  . hydrocortisone  20 mg Oral BID  . insulin aspart  0-5 Units Subcutaneous QHS  . insulin aspart  0-9 Units Subcutaneous TID WC  . nicotine  21 mg Transdermal Daily  . pantoprazole  40 mg Oral Daily  . sodium chloride flush  10-40 mL Intracatheter Q12H  . [START ON 09/03/2017] torsemide  60 mg Oral Daily      Bufford ButtnerElizabeth Latiffany Harwick, MD 09/02/2017, 8:46 AM

## 2017-09-02 NOTE — Consult Note (Addendum)
ELECTROPHYSIOLOGY CONSULT NOTE    Patient ID: JAHMEEK SHIRK MRN: [id number], DOB/AGE: 72-Jan-1947 72 y.o.  Admit date: 08/27/2017 Date of Consult: 09/02/2017  Primary Physician: Renford Dills, MD Primary Cardiologist: Shirlee Latch Electrophysiologist: Elberta Fortis (new this admission)  Patient Profile: Derrick Marsh is a 72 y.o. male with a history of hypertension, hyperlipidemia, COPD, CAD s/p CABG, tobacco abuse who is being seen today for the evaluation of atrial flutter at the request of Dr Shirlee Latch.  HPI:  Derrick Marsh is a 72 y.o. male with the above past medical history. He saw his PCP for evaluation of palpitations and was found to be in atrial flutter. He was referred to [facility] for further evaluation.  On arrival, he was in cardiogenic shock and has been followed closely by AHF team. He was started on anticoagulation and underwent TEE guided cardioversion. He is requiring inotrope support and has had recurrent atrial flutter. He has also been started on IV amiodarone with improvement in rhythm over the last few hours.  EP has been asked to evaluate for treatment options. He is currently feeling much improved from admission. He walked in the hallway some today.    Echo 08/2017 demonstrated EF 20-25%, diffuse hypokinesis, severe hypokinesis of basal-mid inferolateral myocardium, mild MR, LA severely dilated, PA pressure 40, mild to moderate TR.   He denies chest pain, palpitations, PND, nausea, vomiting, dizziness, syncope, edema, weight gain, or early satiety.  Past Medical History:  Diagnosis Date  . Allergic rhinitis   . Atherosclerosis of native arteries of the extremities with intermittent claudication   . 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 HTN (hypertension)   . Hypercholesteremia   . PVC's (premature ventricular contractions)   . Tobacco abuse      Surgical History:  Past Surgical History:    Procedure Laterality Date  . CARDIOVERSION N/A 08/30/2017   Procedure: CARDIOVERSION;  Surgeon: Chrystie Nose, MD;  Location: [facility] ENDOSCOPY;  Service: Cardiovascular;  Laterality: N/A;  . CORONARY ARTERY BYPASS GRAFT  2000  . TEE WITHOUT CARDIOVERSION N/A 08/30/2017   Procedure: TRANSESOPHAGEAL ECHOCARDIOGRAM (TEE);  Surgeon: Chrystie Nose, MD;  Location: [facility] ENDOSCOPY;  Service: Cardiovascular;  Laterality: N/A;     Medications Prior to Admission  Medication Sig Dispense Refill Last Dose  . aspirin EC 81 MG tablet Take 81 mg by mouth daily.   08/27/2017 at 0830  . atorvastatin (LIPITOR) 40 MG tablet TAKE 1 TABLET EVERY DAY 90 tablet 3 08/27/2017 at am  . colchicine 0.6 MG tablet Take 0.6 mg by mouth daily as needed (for gout flares).    Unk at [facility]  . metoprolol tartrate (LOPRESSOR) 25 MG tablet TAKE 1/2 TABLET BY MOUTH TWICE DAILY 90 tablet 3 08/27/2017 at 0830    Inpatient Medications:  . apixaban  2.5 mg Oral BID  . Chlorhexidine Gluconate Cloth  6 each Topical Daily  . hydrocortisone  20 mg Oral BID  . insulin aspart  0-5 Units Subcutaneous QHS  . insulin aspart  0-9 Units Subcutaneous TID WC  . nicotine  21 mg Transdermal Daily  . pantoprazole  40 mg Oral Daily  . sodium chloride flush  10-40 mL Intracatheter Q12H  . [START ON 09/03/2017] torsemide  60 mg Oral Daily    Allergies:  Allergies  Allergen Reactions  . Aleve [Naproxen] Other (See Comments)    Was told  by a MD to not take this; conflicted with his other meds    Social History   Socioeconomic History  . Marital status: Single    Spouse name: Not on file  . Number of children: Not on file  . Years of education: Not on file  . Highest education level: Not on file  Occupational History  . Not on file  Social Needs  . Financial resource strain: Not on file  . Food insecurity:    Worry: Not on file    Inability: Not on file  . Transportation needs:    Medical: Not on file    Non-medical: Not on file   Tobacco Use  . Smoking status: Current Some Day Smoker    Packs/day: 0.50    Types: Cigars  . Smokeless tobacco: Never Used  Substance and Sexual Activity  . Alcohol use: No  . Drug use: No  . Sexual activity: Not on file  Lifestyle  . Physical activity:    Days per week: Not on file    Minutes per session: Not on file  . Stress: Not on file  Relationships  . Social connections:    Talks on phone: Not on file    Gets together: Not on file    Attends religious service: Not on file    Active member of club or organization: Not on file    Attends meetings of clubs or organizations: Not on file    Relationship status: Not on file  . Intimate partner violence:    Fear of current or ex partner: Not on file    Emotionally abused: Not on file    Physically abused: Not on file    Forced sexual activity: Not on file  Other Topics Concern  . Not on file  Social History Narrative  . Not on file     Family History  Problem Relation Age of Onset  . Heart disease Father   . Emphysema Father   . CVA Father   . Heart failure Mother      Review of Systems: All other systems reviewed and are otherwise negative except as noted above.  Physical Exam: Vitals:   09/02/17 0400 09/02/17 0500 09/02/17 0600 09/02/17 0743  BP: 123/66 114/80 126/72   Pulse: 87 82 86   Resp: 13 12 16    Temp:    97.7 F (36.5 C)  TempSrc:    Oral  SpO2: 95% 96% 93%   Weight:   106 lb 11.2 oz (48.4 kg)   Height:        GEN- The patient is elderly and chronically ill appearing, alert and oriented x 3 today.   HEENT: normocephalic, atraumatic; sclera clear, conjunctiva pink; hearing intact; oropharynx clear; neck supple Lungs- Clear to ausculation bilaterally, normal work of breathing.  No wheezes, rales, rhonchi Heart- Regular rate and rhythm  GI- soft, non-tender, non-distended, bowel sounds present Extremities- no clubbing, cyanosis, or edema  MS- no significant deformity or atrophy Skin- warm and  dry, no rash or lesion, multiple tattoos Psych- euthymic mood, full affect Neuro- strength and sensation are intact  Labs:   Lab Results  Component Value Date   WBC 14.3 (H) 09/02/2017   HGB 9.9 (L) 09/02/2017   HCT 30.1 (L) 09/02/2017   MCV 93.8 09/02/2017   PLT 244 09/02/2017    Recent Labs  Lab 09/01/17 0455 09/02/17 0500  NA 135 136  K 3.8 3.3*  CL 101 93*  CO2 22 28  BUN 92* 86*  CREATININE 3.17* 3.08*  CALCIUM 8.4* 9.1  PROT 6.2*  --   BILITOT 0.8  --   ALKPHOS 95  --   ALT 433*  --   AST 192*  --   GLUCOSE 165* 146*      Radiology/Studies: Dg Chest 2 View  Result Date: 08/27/2017 CLINICAL DATA:  Palpitations, shortness of breath with minimal exertion, chest tightness and intermittent dizziness for few days, history COPD EXAM: CHEST - 2 VIEW COMPARISON:  07/04/2009 FINDINGS: Enlargement of cardiac silhouette post CABG with mild pulmonary vascular congestion. Atherosclerotic calcification aorta. Patchy BILATERAL pulmonary infiltrates could represent pulmonary edema or multifocal pneumonia. More focal opacity in the RIGHT upper lobe, likely related to infiltrate though underlying nodule not excluded. No pleural effusion or pneumothorax. Bones demineralized. IMPRESSION: Enlargement of cardiac silhouette with pulmonary vascular congestion post CABG. Patchy BILATERAL pulmonary infiltrates question pulmonary edema versus multifocal pneumonia. Follow-up exams until resolution recommended to exclude underlying pulmonary nodule in the RIGHT upper lobe. Electronically Signed   By: Ulyses Southward M.D.   On: 08/27/2017 17:02   Ct Chest Wo Contrast  Result Date: 08/27/2017 CLINICAL DATA:  Shortness of breath for 2 months. Worsening over the last 2 days. Tobacco abuse. EXAM: CT CHEST WITHOUT CONTRAST TECHNIQUE: Multidetector CT imaging of the chest was performed following the standard protocol without IV contrast. COMPARISON:  Plain films of earlier today.  No prior CT. FINDINGS:  Cardiovascular: Aortic and branch vessel atherosclerosis. Moderate cardiomegaly, without pericardial effusion. Median sternotomy for CABG. Pulmonary artery enlargement, outflow tract 3.1 cm. Mediastinum/Nodes: Right paratracheal node of 1.3 cm. Subcarinal node measures 1.6 cm. Suspect bilateral hilar adenopathy. Lungs/Pleura: Small bilateral pleural effusions. Advanced bullous type emphysema. Upper lung and peripheral predominant areas of patchy airspace and ground-glass opacity. Suspect some areas of mild bronchiectasis related to architectural distortion. These are basilar predominant. Pulmonary nodules, including a 5 mm left upper lobe nodule on image 29/4 and a 7 mm right lower lobe pulmonary nodule on image 99/4. Upper Abdomen: Normal imaged portions of the liver, spleen, stomach, pancreas, adrenal glands, right kidney. Marked left renal atrophy. Abdominal aortic atherosclerosis. Musculoskeletal: Moderate thoracic spondylosis. Mild to moderate T11 compression deformity. IMPRESSION: 1. Airspace and ground-glass opacity which is bilateral and peripheral predominant. Concurrent bilateral pleural effusions. Favor pulmonary edema and superimposed infection. Inflammatory etiologies such as organizing pneumonia felt less likely. Consider antibiotic therapy and diuresis with subsequent plain film radiographic follow-up. 2.  Emphysema (ICD10-J43.9). 3.  Aortic Atherosclerosis (ICD10-I70.0). 4. Pulmonary artery enlargement suggests pulmonary arterial hypertension. 5. Bilateral pulmonary nodules of maximally 7 mm. Non-contrast chest CT at 6-12 months is recommended. If the nodule is stable at time of repeat CT, then future CT at 18-24 months (from today's scan) is considered optional for low-risk patients, but is recommended for high-risk patients. This recommendation follows the consensus statement: Guidelines for Management of Incidental Pulmonary Nodules Detected on CT Images: From the Fleischner Society 2017;  Radiology 2017; 284:228-243. 6. Thoracic adenopathy, favored to be reactive. Electronically Signed   By: Jeronimo Greaves M.D.   On: 08/27/2017 19:36   US Renal  Result Date: 08/29/2017 CLINICAL DATA:  Elevated serum creatinine.  LEFT renal atrophy. EXAM: RENAL / URINARY TRACT ULTRASOUND COMPLETE COMPARISON:  Renal ultrasound dated 09/09/2015. FINDINGS: Right Kidney: Length: 8.5 cm. Echogenicity within normal limits. No mass or hydronephrosis visualized. Left Kidney: Length: 6.3 cm. LEFT renal cortex is diffusely echogenic. No mass or hydronephrosis visualized. Bladder: Decompressed limiting characterization, but unremarkable. IMPRESSION:  1. RIGHT kidney is unremarkable. 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 No hydronephrosis. 2. LEFT kidney is atrophic with diffusely echogenic cortex indicating chronic medical renal disease. No acute findings. No hydronephrosis. 3. Spleen is somewhat heterogeneous in appearance, of uncertain significance. Electronically Signed   By: Bary Richard M.D.   On: 08/29/2017 15:36   Dg Chest Port 1 View  Result Date: 09/01/2017 CLINICAL DATA:  Shortness of breath. History of coronary artery disease, acute on chronic respiratory failure, COPD, current smoker. EXAM: PORTABLE CHEST 1 VIEW COMPARISON:  Portable chest x-ray of August 31, 2017 FINDINGS: The lungs remain hyperinflated. The interstitial markings remain increased bilaterally with areas of near confluence in both upper lobes. The cardiac silhouette is enlarged. The central pulmonary vascularity is prominent but there is no definite cephalization. There are post CABG changes. There is calcification in the wall of the aortic arch. The right internal jugular venous catheter tip projects over the distal third of the SVC. IMPRESSION: Stable appearance of the chest. COPD. Bilateral pneumonia greatest in the left upper  lobe. No CHF. Thoracic aortic atherosclerosis. Electronically Signed   By: David  [place] M.D.   On: 09/01/2017 08:04   Dg Chest Port 1 View  Result Date: 08/31/2017 CLINICAL DATA:  Shortness of breath.  Central line placement. EXAM: PORTABLE CHEST 1 VIEW COMPARISON:  Radiograph of August 31, 2017. FINDINGS: Stable cardiomediastinal silhouette. Status post coronary artery bypass graft. Stable left upper lobe opacity is noted concerning for pneumonia. No pneumothorax or significant pleural effusion is noted. Interval placement of right internal jugular catheter with distal tip in expected position of the SVC. Bony thorax is unremarkable. IMPRESSION: Interval placement of right internal jugular catheter with distal tip in expected position of the SVC. No pneumothorax is noted. Stable left upper lobe opacity is noted concerning for pneumonia. Followup PA and lateral chest X-ray is recommended in 3-4 weeks following trial of antibiotic therapy to ensure resolution and exclude underlying malignancy. Electronically Signed   By: Lupita Raider, M.D.   On: 08/31/2017 12:05   Dg Chest Port 1 View  Result Date: 08/31/2017 CLINICAL DATA:  Shortness of breath EXAM: PORTABLE CHEST 1 VIEW COMPARISON:  08/30/2017 FINDINGS: Cardiac shadow is enlarged but stable. Postsurgical changes are again seen. There are diffuse bilateral infiltrative densities identified somewhat increased from the prior exam particularly on the left. No sizable effusion is seen. No acute bony abnormality is noted. IMPRESSION: Increasing left-sided infiltrates. Electronically Signed   By: Alcide Clever M.D.   On: 08/31/2017 07:11   Dg Chest Port 1 View  Result Date: 08/30/2017 CLINICAL DATA:  Shortness of breath and chest pressure EXAM: PORTABLE CHEST 1 VIEW COMPARISON:  08/27/2017 FINDINGS: Cardiac shadow is enlarged. Postsurgical changes are again identified and stable. Diffuse bilateral infiltrative changes are again identified and stable from the  prior exam. No new focal infiltrate or sizable effusion is seen. No pneumothorax is seen. Bony structures are stable. IMPRESSION: No change from the prior exam. Electronically Signed   By: Alcide Clever M.D.   On: 08/30/2017 19:50   [place] Ekg Site Rite  Result Date: 08/30/2017 If Site Rite image not attached, placement could not be confirmed due to current cardiac rhythm.   [id number] atrial flutter (personally reviewed)  TELEMETRY: SR, atrial flutter (personally reviewed)  Assessment/Plan: 1.  Atypical atrial flutter In the setting of severe left atrial enlargement. I am not optimistic  about success rates with ablation.  His comorbidities also make him at high risk for any procedures. For now, would continue to try to wean inotropes and continue amiodarone.  Continue eliquis for CHADS2VASC of at least 4  2.  Acute systolic heart failure Improving Management per AHF team  3.  [facility] following  Dr Elberta Fortis to see later today  Signed, Gypsy Balsam, NP 09/02/2017 9:24 AM   I have seen and examined this patient with Gypsy Balsam.  Agree with above, note added to reflect my findings.  On exam, RRR, no murmurs, lungs clear.   Patient initially admitted to the hospital with heart failure.  He was found to be in likely an atypical atrial flutter.  He was cardioverted and put on amiodarone.  Since then, he has been going in and out of atrial flutter.  Over the past 12 or so hours, fortunately he has been in normal rhythm.  His ejection fraction is 20% and he has a severely dilated left atrium.  At this point, I do not feel that he has good candidate for ablation.  Would continue his amiodarone and switch to p.o. closer to hospital discharge.    Will M. Camnitz MD 09/02/2017 11:16 AM

## 2017-09-02 NOTE — Progress Notes (Signed)
Patient ID: Derrick Marsh, male   DOB: 12/04/1945, 72 y.o.   MRN: [id number]     Advanced Heart Failure Rounding Note  PCP-Cardiologist: No primary care provider on file.   Subjective:    He diuresed well yesterday, weight down and CVP down to 8-9.  Creatinine trending down 3.17 => 3.08.  He is currently on milrinone 0.125.  Co-ox 67% this morning.   He remains on amiodarone gtt at 60 mg/hr.  He continues to go in and out of atrial flutter.  At times, rates up to 140s with low BP.  Was in NSR most of night but back in flutter this am with rate around 100.  BP currently stable.   CXR with LUL PNA.  He is on ceftriaxone for this, ?aspiration. He completed a course of azithromycin already.   No dyspnea or chest pain this morning.    Objective:   Weight Range: 106 lb 11.2 oz (48.4 kg) Body mass index is 19.52 kg/m.   Vital Signs:   Temp:  [97.4 F (36.3 C)-97.8 F (36.6 C)] 97.7 F (36.5 C) (06/20 0743) Pulse Rate:  [51-167] 86 (06/20 0600) Resp:  [11-21] 16 (06/20 0600) BP: (93-142)/(60-95) 126/72 (06/20 0600) SpO2:  [81 %-100 %] 93 % (06/20 0600) Weight:  [106 lb 11.2 oz (48.4 kg)] 106 lb 11.2 oz (48.4 kg) (06/20 0600) Last BM Date: 09/01/17  Weight change: Filed Weights   08/30/17 1225 08/31/17 0500 09/02/17 0600  Weight: 103 lb 6.4 oz (46.9 kg) 111 lb 1.8 oz (50.4 kg) 106 lb 11.2 oz (48.4 kg)    Intake/Output:   Intake/Output Summary (Last 24 hours) at 09/02/2017 0816 Last data filed at 09/02/2017 0600 Gross per 24 hour  Intake 1584.73 ml  Output 4050 ml  Net -2465.27 ml      Physical Exam    General: NAD Neck: JVP 8-9 cm, no thyromegaly or thyroid nodule.  Lungs: Rhonchi bilaterally.  CV: Nondisplaced PMI.  Heart mildly tachy, irregular S1/S2, no S3/S4, no murmur.  No peripheral edema.   Abdomen: Soft, nontender, no hepatosplenomegaly, no distention.  Skin: Intact without lesions or rashes.  Neurologic: Alert and oriented x 3.  Psych: Normal  affect. Extremities: No clubbing or cyanosis.  HEENT: Normal.    Telemetry   Atrial flutter rate 100s (personally reviewed)  Labs    CBC Recent Labs    09/01/17 0455 09/02/17 0500  WBC 12.2* 14.3*  NEUTROABS 10.6* 11.9*  HGB 9.6* 9.9*  HCT 29.7* 30.1*  MCV 96.1 93.8  PLT 254 244   Basic Metabolic Panel Recent Labs    40/98/11 0313 09/01/17 0455 09/02/17 0500  NA 139 135 136  K 4.4 3.8 3.3*  CL 103 101 93*  CO2 20* 22 28  GLUCOSE 133* 165* 146*  BUN 87* 92* 86*  CREATININE 3.03* 3.17* 3.08*  CALCIUM 8.2* 8.4* 9.1  MG 2.2 2.2  --   PHOS  --  6.4*  --    Liver Function Tests Recent Labs    08/31/17 0313 09/01/17 0455  AST 523* 192*  ALT 592* 433*  ALKPHOS 93 95  BILITOT 1.1 0.8  PROT 5.9* 6.2*  ALBUMIN 3.4* 3.5   No results for input(s): LIPASE, AMYLASE in the last 72 hours. Cardiac Enzymes Recent Labs    08/30/17 1710  TROPONINI 0.06*    BNP: BNP (last 3 results) Recent Labs    08/27/17 2239 08/31/17 0313  BNP 3,310.3* 1,505.7*    ProBNP (last 3  results) No results for input(s): PROBNP in the last 8760 hours.   D-Dimer No results for input(s): DDIMER in the last 72 hours. Hemoglobin A1C No results for input(s): HGBA1C in the last 72 hours. Fasting Lipid Panel No results for input(s): CHOL, HDL, LDLCALC, TRIG, CHOLHDL, LDLDIRECT in the last 72 hours. Thyroid Function Tests Recent Labs    08/30/17 1842  TSH 10.501*    Other results:   Imaging    No results found.   Medications:     Scheduled Medications: . apixaban  2.5 mg Oral BID  . Chlorhexidine Gluconate Cloth  6 each Topical Daily  . hydrocortisone  20 mg Oral BID  . insulin aspart  0-5 Units Subcutaneous QHS  . insulin aspart  0-9 Units Subcutaneous TID WC  . nicotine  21 mg Transdermal Daily  . pantoprazole  40 mg Oral Daily  . sodium chloride flush  10-40 mL Intracatheter Q12H  . [START ON 09/03/2017] torsemide  60 mg Oral Daily    Infusions: .  amiodarone 60 mg/hr (09/02/17 0346)  . cefTRIAXone (ROCEPHIN)  IV 1 g (09/01/17 0844)  . norepinephrine (LEVOPHED) Adult infusion Stopped (08/31/17 1459)    PRN Medications: acetaminophen, colchicine, dextromethorphan-guaiFENesin, ipratropium, levalbuterol, nitroGLYCERIN, ondansetron (ZOFRAN) IV, sodium chloride flush   Assessment/Plan   1. Acute systolic CHF/cardiogenic shock: Echo with EF 20-25%, moderate RV systolic dysfunction.  We do not know his baseline EF, no echo in system.  He has history of CABG, so ischemic cardiomyopathy is possible.  No chest pain.  TnI was mildly elevated at 0.09 but likely demand ischema with volume overload/hypotension/tachycardia.  Tachycardia-mediated cardiomyopathy is also possible, he was admitted in atypical flutter with RVR, unsure of how long he had been in it.  He was cardioverted on 6/17 but was back in atrial flutter with RVR 6/18.  He was hypotensive post-TEE 6/17, required intubation and phenylephrine.  He is now extubated and off phenylephrine.  He went back into NSR on amiodarone gtt.  Milrinone started with low co-ox on 6/18, decreased to 0.125 with elevated HR in atrial flutter again overnight. He diuresed well last night, CVP down to 8-9.  Co-ox good at 67% this morning on milrinone 0.125, he is back in atypical flutter with rate in 100s.  - With good co-ox and CVP down, will stop milrinone this morning. May help him maintain NSR.  - Will give 1 dose of Lasix 120 mg IV this morning, transition to po torsemide tomorrow.  - Needs to maintain NSR, in and out of flutter on amiodarone.  Will ask EP to see regarding atrial flutter ablation.   - May need formal RHC, will see how he responds to current measures.  - No ACEI/ARB/ARNI/digoxin/spironolactone with AKI, No beta blocker with low output.  - Cannot rule out progression of CAD with ischemic CMP though doubt ACS this admission.  Creatinine too high for cath though needs eventually if creatinine comes  down.   - Not candidate for advanced therapies currently with AKI.  2. Atrial flutter: With RVR.  Atypical flutter.  DCCV 6/17 but did not hold, back in flutter with RVR 6/18.  He was admitted with aflutter/RVR, suspicion for tachy-mediated CMP.  He has been in and out of flutter overnight, currently back in flutter.  - Continue amiodarone gtt for now, need to keep in NSR. - Now on Eliquis.  - Will ask EP to see, atrial flutter ablation may be the best long-term solution.  3. AKI: On suspected  CKD stage 3.  Creatinine 1.6 in 2018.  Had small kidneys suggestive of medical renal disease on last [place]. Creatinine up to 3.17 today, suspect due to hypotension/low output/cardiorenal syndrome.  I am quite concerned by his renal function as his baseline is likely poor.  He would not be a very good HD candidate with CHF/low EF. Creatinine has stabilized and is trending down now. 4. Elevated LFTs: Possible shock liver in setting of hypotension/low output.  LFTs trending down.  - Can restart statin.  - Will use amiodarone but will monitor LFTs closely.  5. CAD: s/p CABG 2000, no cath since that time.  See discussion above, cannot rule out ischemic cardiomyopathy but doubt ACS => TnI 0.06 with no trend.  6. COPD exacerbation with LUL PNA: He has completed azithromycin and is now on ceftriaxone alone.  - Weaning off hydrocortisone.  - He remains on ceftriaxone IV.   CRITICAL CARE Performed by: Marca Ancona  Total critical care time: 35 minutes  Critical care time was exclusive of separately billable procedures and treating other patients.  Critical care was necessary to treat or prevent imminent or life-threatening deterioration.  Critical care was time spent personally by me on the following activities: development of treatment plan with patient and/or surrogate as well as nursing, discussions with consultants, evaluation of patient's response to treatment, examination of patient, obtaining history from  patient or surrogate, ordering and performing treatments and interventions, ordering and review of laboratory studies, ordering and review of radiographic studies, pulse oximetry and re-evaluation of patient's condition. Length of Stay: 6  Marca Ancona, MD  09/02/2017, 8:16 AM  Advanced Heart Failure Team Pager [phone] (M-F; 7a - 4p)  Please contact [facility] for night-coverage after hours (4p -7a ) and weekends on [email or web address]

## 2017-09-03 LAB — COOXEMETRY PANEL
Carboxyhemoglobin: 1.4 % (ref 0.5–1.5)
Methemoglobin: 1.4 % (ref 0.0–1.5)
O2 Saturation: 68.3 %
Total hemoglobin: 10.7 g/dL — ABNORMAL LOW (ref 12.0–16.0)

## 2017-09-03 LAB — CBC WITH DIFFERENTIAL/PLATELET
ABS IMMATURE GRANULOCYTES: 0.1 10*3/uL (ref 0.0–0.1)
BASOS ABS: 0 10*3/uL (ref 0.0–0.1)
Basophils Relative: 0 %
Eosinophils Absolute: 0 10*3/uL (ref 0.0–0.7)
Eosinophils Relative: 0 %
HEMATOCRIT: 32.2 % — AB (ref 39.0–52.0)
HEMOGLOBIN: 10.5 g/dL — AB (ref 13.0–17.0)
Immature Granulocytes: 1 %
LYMPHS ABS: 0.5 10*3/uL — AB (ref 0.7–4.0)
LYMPHS PCT: 5 %
MCH: 30.3 pg (ref 26.0–34.0)
MCHC: 32.6 g/dL (ref 30.0–36.0)
MCV: 93.1 fL (ref 78.0–100.0)
MONO ABS: 1.2 10*3/uL — AB (ref 0.1–1.0)
MONOS PCT: 11 %
NEUTROS ABS: 9.2 10*3/uL — AB (ref 1.7–7.7)
Neutrophils Relative %: 83 %
Platelets: 239 10*3/uL (ref 150–400)
RBC: 3.46 MIL/uL — ABNORMAL LOW (ref 4.22–5.81)
RDW: 14.7 % (ref 11.5–15.5)
WBC: 10.9 10*3/uL — ABNORMAL HIGH (ref 4.0–10.5)

## 2017-09-03 LAB — COMPREHENSIVE METABOLIC PANEL
ALK PHOS: 102 U/L (ref 38–126)
ALT: 289 U/L — ABNORMAL HIGH (ref 17–63)
AST: 66 U/L — ABNORMAL HIGH (ref 15–41)
Albumin: 3.3 g/dL — ABNORMAL LOW (ref 3.5–5.0)
Anion gap: 12 (ref 5–15)
BUN: 76 mg/dL — ABNORMAL HIGH (ref 6–20)
CALCIUM: 9 mg/dL (ref 8.9–10.3)
CO2: 33 mmol/L — AB (ref 22–32)
CREATININE: 2.62 mg/dL — AB (ref 0.61–1.24)
Chloride: 89 mmol/L — ABNORMAL LOW (ref 101–111)
GFR calc non Af Amer: 23 mL/min — ABNORMAL LOW (ref >60)
GFR, EST AFRICAN AMERICAN: 26 mL/min — AB (ref >60)
Glucose, Bld: 134 mg/dL — ABNORMAL HIGH (ref 65–99)
Potassium: 3.1 mmol/L — ABNORMAL LOW (ref 3.5–5.1)
SODIUM: 134 mmol/L — AB (ref 135–145)
Total Bilirubin: 1 mg/dL (ref 0.3–1.2)
Total Protein: 6.1 g/dL — ABNORMAL LOW (ref 6.5–8.1)

## 2017-09-03 LAB — GLUCOSE, CAPILLARY
GLUCOSE-CAPILLARY: 129 mg/dL — AB (ref 65–99)
GLUCOSE-CAPILLARY: 132 mg/dL — AB (ref 65–99)
Glucose-Capillary: 117 mg/dL — ABNORMAL HIGH (ref 65–99)
Glucose-Capillary: 125 mg/dL — ABNORMAL HIGH (ref 65–99)
Glucose-Capillary: 104 mg/dL — ABNORMAL HIGH (ref 65–99)

## 2017-09-03 MED ORDER — POTASSIUM CHLORIDE CRYS ER 20 MEQ PO TBCR
40.0000 meq | EXTENDED_RELEASE_TABLET | Freq: Once | ORAL | Status: AC
  Administered 2017-09-03: 40 meq via ORAL
  Filled 2017-09-03: qty 2

## 2017-09-03 MED ORDER — AMIODARONE IV BOLUS ONLY 150 MG/100ML: 150.0000 mg | Freq: Once | INTRAVENOUS | Status: DC

## 2017-09-03 MED ORDER — CEFDINIR 300 MG PO CAPS
300.0000 mg | ORAL_CAPSULE | Freq: Every day | ORAL | Status: AC
  Administered 2017-09-04 – 2017-09-07 (×4): 300 mg via ORAL
  Filled 2017-09-03 (×4): qty 1

## 2017-09-03 MED ORDER — AMIODARONE LOAD VIA INFUSION
150.0000 mg | Freq: Once | INTRAVENOUS | Status: AC
  Administered 2017-09-03: 150 mg via INTRAVENOUS
  Filled 2017-09-03: qty 83.34

## 2017-09-03 MED ORDER — ATORVASTATIN CALCIUM 40 MG PO TABS
40.0000 mg | ORAL_TABLET | Freq: Every day | ORAL | Status: DC
  Administered 2017-09-03 – 2017-09-06 (×4): 40 mg via ORAL
  Filled 2017-09-03 (×4): qty 1

## 2017-09-03 NOTE — Progress Notes (Addendum)
Patient ID: Derrick Marsh, male   DOB: 1946-01-22, [age] y.o.   MRN: [id number]     Advanced Heart Failure Rounding Note  PCP-Cardiologist: No primary care provider on file.   Subjective:    Coox 68.3%. Milrinone stopped yesterday.   Creatinine 2.62.   Appears to be in NSR this am on amio gtt at 60 mg/hr.  BP stable.   No dyspnea or chest pain this morning. Feeling better. Denies lightheadedness or dizziness.   CXR with LUL PNA.  He is on ceftriaxone for this, ?aspiration. He completed a course of azithromycin already.   Objective:   Weight Range: 100 lb 14.4 oz (45.8 kg) Body mass index is 18.45 kg/m.   Vital Signs:   Temp:  [97.8 F (36.6 C)-98.2 F (36.8 C)] 98.2 F (36.8 C) (06/21 0735) Pulse Rate:  [50-108] 95 (06/21 0600) Resp:  [12-21] 18 (06/21 0600) BP: (103-126)/(62-89) 112/83 (06/21 0600) SpO2:  [60 %-100 %] 96 % (06/21 0600) Weight:  [100 lb 14.4 oz (45.8 kg)] 100 lb 14.4 oz (45.8 kg) (06/21 0500) Last BM Date: 08/27/17  Weight change: Filed Weights   08/31/17 0500 09/02/17 0600 09/03/17 0500  Weight: 111 lb 1.8 oz (50.4 kg) 106 lb 11.2 oz (48.4 kg) 100 lb 14.4 oz (45.8 kg)    Intake/Output:   Intake/Output Summary (Last 24 hours) at 09/03/2017 0806 Last data filed at 09/03/2017 0757 Gross per 24 hour  Intake 1028.08 ml  Output 4225 ml  Net -3196.92 ml      Physical Exam    General: NAD  HEENT: Normal Neck: Supple. JVP 5-6. Carotids 2+ bilat; no bruits. No thyromegaly or nodule noted. Cor: PMI nondisplaced. RRR, No M/G/R noted Lungs: CTAB, normal effort. Abdomen: Soft, non-tender, non-distended, no HSM. No bruits or masses. +BS  Extremities: No cyanosis, clubbing, or rash. R and LLE no edema.  Neuro: Alert & orientedx3, cranial nerves grossly intact. moves all 4 extremities w/o difficulty. Affect pleasant   Telemetry   NSR 70-80s with PVCs, personally reviewed.   Labs    CBC Recent Labs    09/02/17 0500 09/03/17 0410  WBC 14.3* 10.9*    NEUTROABS 11.9* 9.2*  HGB 9.9* 10.5*  HCT 30.1* 32.2*  MCV 93.8 93.1  PLT 244 239   Basic Metabolic Panel Recent Labs    40/98/1106/19/19 0455 09/02/17 0500 09/03/17 0410  NA 135 136 134*  K 3.8 3.3* 3.1*  CL 101 93* 89*  CO2 22 28 33*  GLUCOSE 165* 146* 134*  BUN 92* 86* 76*  CREATININE 3.17* 3.08* 2.62*  CALCIUM 8.4* 9.1 9.0  MG 2.2  --   --   PHOS 6.4*  --   --    Liver Function Tests Recent Labs    09/01/17 0455 09/03/17 0410  AST 192* 66*  ALT 433* 289*  ALKPHOS 95 102  BILITOT 0.8 1.0  PROT 6.2* 6.1*  ALBUMIN 3.5 3.3*   No results for input(s): LIPASE, AMYLASE in the last 72 hours. Cardiac Enzymes No results for input(s): CKTOTAL, CKMB, CKMBINDEX, TROPONINI in the last 72 hours.  BNP: BNP (last 3 results) Recent Labs    08/27/17 2239 08/31/17 0313  BNP 3,310.3* 1,505.7*    ProBNP (last 3 results) No results for input(s): PROBNP in the last 8760 hours.   D-Dimer No results for input(s): DDIMER in the last 72 hours. Hemoglobin A1C No results for input(s): HGBA1C in the last 72 hours. Fasting Lipid Panel No results for input(s): CHOL,  HDL, LDLCALC, TRIG, CHOLHDL, LDLDIRECT in the last 72 hours. Thyroid Function Tests No results for input(s): TSH, T4TOTAL, T3FREE, THYROIDAB in the last 72 hours.  Invalid input(s): FREET3  Other results:   Imaging    No results found.   Medications:     Scheduled Medications: . apixaban  2.5 mg Oral BID  . Chlorhexidine Gluconate Cloth  6 each Topical Daily  . insulin aspart  0-5 Units Subcutaneous QHS  . insulin aspart  0-9 Units Subcutaneous TID WC  . nicotine  21 mg Transdermal Daily  . pantoprazole  40 mg Oral Daily  . sodium chloride flush  10-40 mL Intracatheter Q12H  . torsemide  60 mg Oral Daily    Infusions: . amiodarone 60 mg/hr (09/03/17 0757)  . cefTRIAXone (ROCEPHIN)  IV 1 g (09/03/17 0757)  . norepinephrine (LEVOPHED) Adult infusion Stopped (08/31/17 1459)    PRN  Medications: acetaminophen, colchicine, dextromethorphan-guaiFENesin, ipratropium, levalbuterol, nitroGLYCERIN, ondansetron (ZOFRAN) IV, sodium chloride flush   Assessment/Plan   1. Acute systolic CHF/cardiogenic shock:  - Echo with EF 20-25%, moderate RV systolic dysfunction.  We do not know his baseline EF, no echo in system.  He has history of CABG, so ischemic cardiomyopathy is possible.  No chest pain.  TnI was mildly elevated at 0.09 but likely demand ischema with volume overload/hypotension/tachycardia.  Tachycardia-mediated cardiomyopathy is also possible, he was admitted in atypical flutter with RVR, unsure of how long he had been in it.  He was cardioverted on 6/17 but was back in atrial flutter with RVR 6/18.  He was hypotensive post-TEE 6/17, required intubation and phenylephrine.  He is now extubated and off phenylephrine.  He went back into NSR on amiodarone gtt.  Milrinone started with low co-ox on 6/18, decreased to 0.125 with elevated HR in atrial flutter.  Milrinone was stopped on 6/20. He diuresed well again yesterday.  - Coox 68.3% this morning.  - CVP 5-6.  - Stop lasix. Give torsemide 60 mg daily.  - May need formal RHC, will see how he responds to current measures.  - No ACEI/ARB/ARNI/digoxin/spironolactone with AKI, No beta blocker with low output.  - Cannot rule out progression of CAD with ischemic CMP though doubt ACS this admission.  Creatinine too high for cath though needs eventually if creatinine comes down.   - Not candidate for advanced therapies currently with AKI.  2. Atrial flutter: With RVR.  Atypical flutter.  DCCV 6/17 but did not hold, back in flutter with RVR 6/18.  He was admitted with aflutter/RVR, suspicion for tachy-mediated CMP.   - Remains in NSR this am. Decrease amio gtt to 30 mg/hr.  - Now on Eliquis.  - EP has seen. Not good candidate for ablation with his severe CHF.  3. AKI: On suspected CKD stage 3. -  Creatinine 1.6 in 2018.  Had small kidneys  suggestive of medical renal disease on last [place].  Suspect AKI due to hypotension/low output/cardiorenal syndrome.   - Concerned by his renal function as his baseline is likely poor.  He would not be a very good HD candidate with CHF/low EF. Creatinine has stabilized and is trending down now. 4. Elevated LFTs: -  Possible shock liver in setting of hypotension/low output.  LFTs trending down.  - Can restart statin today.  - Will use amiodarone but will monitor LFTs closely.  5. CAD: s/p CABG 2000 - No cath since that time.   - See discussion above, cannot rule out ischemic cardiomyopathy but doubt  ACS => TnI 0.06 with no trend.  6. COPD exacerbation with LUL PNA: - He has completed azithromycin and is now on ceftriaxone alone.  - Weaned off hydrocortisone.  - He remains on ceftriaxone IV.   Graciella Freer, PA-C  09/03/2017, 8:06 AM  Advanced Heart Failure Team Pager [phone] (M-F; 7a - 4p)  Please contact [facility] for night-coverage after hours (4p -7a ) and weekends on [email or web address]  Patient seen with PA, agree with the above note.   He does much better in NSR.  Creatinine trending down, 2.6 today.  CVP 5-6 today, co-ox 68% off milrinone.   Decrease amiodarone to 30 mg/hr today, will transition to po tomorrow if he remains in NSR.  He is on Eliquis 2.5 mg bid.   Stop IV Lasix, transition to torsemide for home.   Change antibiotic to cefdinir tomorrow, complete 7 day course.   Ambulate, may go to step-down.   Marca Ancona 09/03/2017 8:47 AM

## 2017-09-03 NOTE — Progress Notes (Signed)
Cardiac rehab attempted to see pt x2 today. Spoke with RN, pt incontinent of stool and being tested for c-diff. Will wait for results before we ambulate the pt. Will continue to follow.  Reynold Boweneresa  Megumi Treaster, RN BSN 09/03/2017 2:32 PM

## 2017-09-03 NOTE — Progress Notes (Signed)
[facility] Progress Note    Assessment/ Plan:   1. AoCKD3; follows at our office (prev [facility] [place], no visit since his retirement) SCr was 1.6 11/2016.  Renal US with small, shrunken kidneys, L > R.  He does not have lots of reserve. Cr improving with improved cardiac output and more effective diuresis.  Of note, he is a poor iHD candidate at present based on his suboptimal hemodynamics and severely depressed EF. Fortunately there are no immediate needs for dialysis at this time.  Will continue to follow for another day off inotropes and on PO diuretics.  2. Acute systolic CHF: EF 16-10%15-20% by TEE, severely dilated LA.  Advanced CHF following- off milrinone and on PO diuretics  3. Aflutter: DCCV unsuccessful, now back in NSR on amio gtt, continue on hep gtt.  Per cardiology/ AHF.  Not an ablation candidate  4. CAP: s/p azithromycin but increasing infiltrate- on CTX now (6/19)  5. Elevated TSH: TSH > 10 6/17-- per primary  6. Elevated LFTs: I suspect shock liver/ possible congestive hepatopathy, per primary, improving  7. Mild metabolic acidosis:  After hypotension, now is resolved  8. Dispo: in ICU    Subjective:    Off milrinone, on PO torsemide.  In NSR, Co-ox 68.2   Objective:   BP 112/83   Pulse 95   Temp 98.2 F (36.8 C) (Oral)   Resp 18   Ht 5\' 2"  (1.575 m)   Wt 45.8 kg (100 lb 14.4 oz)   SpO2 96%   BMI 18.45 kg/m   Intake/Output Summary (Last 24 hours) at 09/03/2017 [id number] Last data filed at 09/03/2017 0800 Gross per 24 hour  Intake 1085.59 ml  Output 4375 ml  Net -3289.41 ml   Weight change: -2.632 kg (-5 lb 12.8 oz)  Physical Exam: GEN:No acute distress, sitting upright ichair VWU:JWJXENT:NCAT, JVD improved EYES:EOMI CV:rrr no m/r/g PULM:L > R inspiratory crackles, improved, esp in upper lung fields BJY:NWGNABD:Soft, nontender, no suprapubic distention or tenderness, bowel sounds present GU: Foley placed, clear urine SKIN:heavily tattooed EXT:No  edema    Imaging: No results found.  Labs: BMET Recent Labs  Lab 08/29/17 0601 08/30/17 0438 08/30/17 1941 08/31/17 0313 09/01/17 0455 09/02/17 0500 09/03/17 0410  NA 140 139 142 139 135 136 134*  K 3.9 4.4 5.0 4.4 3.8 3.3* 3.1*  CL 104 101 107 103 101 93* 89*  CO2 25 26 19* 20* 22 28 33*  GLUCOSE 154* 134* 114* 133* 165* 146* 134*  BUN 60* 79* 84* 87* 92* 86* 76*  CREATININE 2.46* 2.71* 2.96* 3.03* 3.17* 3.08* 2.62*  CALCIUM 9.3 9.3 8.8* 8.2* 8.4* 9.1 9.0  PHOS  --   --   --   --  6.4*  --   --    CBC Recent Labs  Lab 08/31/17 0313 09/01/17 0455 09/02/17 0500 09/03/17 0410  WBC 15.1* 12.2* 14.3* 10.9*  NEUTROABS 13.2* 10.6* 11.9* 9.2*  HGB 10.4* 9.6* 9.9* 10.5*  HCT 32.8* 29.7* 30.1* 32.2*  MCV 99.1 96.1 93.8 93.1  PLT 265 254 244 239    Medications:    . apixaban  2.5 mg Oral BID  . atorvastatin  40 mg Oral q1800  . [START ON 09/04/2017] cefdinir  300 mg Oral Daily  . Chlorhexidine Gluconate Cloth  6 each Topical Daily  . insulin aspart  0-5 Units Subcutaneous QHS  . insulin aspart  0-9 Units Subcutaneous TID WC  . nicotine  21 mg Transdermal Daily  . pantoprazole  40 mg Oral Daily  . potassium chloride  40 mEq Oral Once  . sodium chloride flush  10-40 mL Intracatheter Q12H  . torsemide  60 mg Oral Daily      Bufford Buttner, MD 09/03/2017, 9:37 AM

## 2017-09-03 NOTE — Progress Notes (Signed)
Report given to nurse on 2W and patient transferred to 2W-23. Family at bedside.

## 2017-09-03 NOTE — Progress Notes (Signed)
Mardelle MatteAndy, [place] notified about patient going back into a flutter/ a fib while ambulating.

## 2017-09-03 NOTE — Care Management Note (Signed)
Case Management Note Previous note created by Letha Capeeborah Taylor  Patient Details  Name: Derrick Marsh MRN: [id number] Date of Birth: 1945-12-28  Subjective/Objective:  From home,  NCM spoke with patient , he was not ready to choose agency for HHPT, NCM left [place] agency list with patient and gave him the eliquis 30 day free coupon card and his co pay amt of 8.50 for refills.                 Action/Plan: DC home with HHPT when ready, will need HHPT order.  Expected Discharge Date:                  Expected Discharge Plan:  Home w Home Health Services  In-House Referral:  Clinical Social Work  Discharge planning Services  CM Consult, Medication Assistance  Post Acute Care Choice:  Home Health Choice offered to:  Patient  DME Arranged:    DME Agency:     HH Arranged:    HH Agency:     Status of Service:  In process, will continue to follow  If discussed at Long Length of Stay Meetings, dates discussed:    Additional Comments: 09/03/2017 Per pt; he has not chosen HH agency - CM encouraged pt to review list this evening Cherylann ParrClaxton, Takako Minckler S, RN 09/03/2017, 3:12 PM

## 2017-09-03 NOTE — Progress Notes (Signed)
CARDIAC REHAB PHASE I   PRE:  Rate/Rhythm: 73 SR  BP:  Supine:   Sitting: 112/63  Standing:    SaO2: 100% 2L   89%RA  MODE:  Ambulation: 370 ft   POST:  Rate/Rhythm: 165 aflutter then some? afib and to 90 aflutter      Maintained 80's NSR most of walk  BP:  Supine:   Sitting: 119/85  Standing:    SaO2: 95% 2L 1402-1435 Pt walked 370 ft on 2L with gait belt use, rolling walker and asst x 2. Maintained NSR most of walk but toward end of walk he converted into aflutter rate 160's asymptomatic. Denied palpitations. Had pt sit in recliner and heart rate started to decline. As rate slowed, you could see flutter waves. Left pt on 2L. BP and sats ok. Visitors in room. Notified RN.    Luetta Nuttingharlene Harnoor Kohles, RN BSN  09/03/2017 2:29 PM

## 2017-09-04 LAB — COMPREHENSIVE METABOLIC PANEL
ALK PHOS: 105 U/L (ref 38–126)
ALT: 218 U/L — AB (ref 17–63)
ANION GAP: 13 (ref 5–15)
AST: 47 U/L — ABNORMAL HIGH (ref 15–41)
Albumin: 3.4 g/dL — ABNORMAL LOW (ref 3.5–5.0)
BUN: 71 mg/dL — ABNORMAL HIGH (ref 6–20)
CO2: 35 mmol/L — ABNORMAL HIGH (ref 22–32)
CREATININE: 2.75 mg/dL — AB (ref 0.61–1.24)
Calcium: 9.5 mg/dL (ref 8.9–10.3)
Chloride: 89 mmol/L — ABNORMAL LOW (ref 101–111)
GFR, EST AFRICAN AMERICAN: 25 mL/min — AB (ref >60)
GFR, EST NON AFRICAN AMERICAN: 22 mL/min — AB (ref >60)
Glucose, Bld: 129 mg/dL — ABNORMAL HIGH (ref 65–99)
Potassium: 3.2 mmol/L — ABNORMAL LOW (ref 3.5–5.1)
Sodium: 137 mmol/L (ref 135–145)
TOTAL PROTEIN: 6.5 g/dL (ref 6.5–8.1)
Total Bilirubin: 1 mg/dL (ref 0.3–1.2)

## 2017-09-04 LAB — CBC WITH DIFFERENTIAL/PLATELET
ABS IMMATURE GRANULOCYTES: 0.1 10*3/uL (ref 0.0–0.1)
BASOS PCT: 0 %
Basophils Absolute: 0 10*3/uL (ref 0.0–0.1)
Eosinophils Absolute: 0.2 10*3/uL (ref 0.0–0.7)
Eosinophils Relative: 2 %
HCT: 34.9 % — ABNORMAL LOW (ref 39.0–52.0)
HEMOGLOBIN: 11.7 g/dL — AB (ref 13.0–17.0)
Immature Granulocytes: 0 %
Lymphocytes Relative: 7 %
Lymphs Abs: 0.9 10*3/uL (ref 0.7–4.0)
MCH: 30.8 pg (ref 26.0–34.0)
MCHC: 33.5 g/dL (ref 30.0–36.0)
MCV: 91.8 fL (ref 78.0–100.0)
MONO ABS: 1.9 10*3/uL — AB (ref 0.1–1.0)
MONOS PCT: 14 %
NEUTROS ABS: 10.3 10*3/uL — AB (ref 1.7–7.7)
Neutrophils Relative %: 77 %
PLATELETS: 267 10*3/uL (ref 150–400)
RBC: 3.8 MIL/uL — ABNORMAL LOW (ref 4.22–5.81)
RDW: 14.8 % (ref 11.5–15.5)
WBC: 13.3 10*3/uL — ABNORMAL HIGH (ref 4.0–10.5)

## 2017-09-04 LAB — GLUCOSE, CAPILLARY
GLUCOSE-CAPILLARY: 130 mg/dL — AB (ref 65–99)
Glucose-Capillary: 125 mg/dL — ABNORMAL HIGH (ref 65–99)
Glucose-Capillary: 105 mg/dL — ABNORMAL HIGH (ref 65–99)
Glucose-Capillary: 129 mg/dL — ABNORMAL HIGH (ref 65–99)

## 2017-09-04 LAB — COOXEMETRY PANEL
CARBOXYHEMOGLOBIN: 1.7 % — AB (ref 0.5–1.5)
Methemoglobin: 0.8 % (ref 0.0–1.5)
O2 Saturation: 70.2 %
TOTAL HEMOGLOBIN: 11.7 g/dL — AB (ref 12.0–16.0)

## 2017-09-04 MED ORDER — CARVEDILOL 3.125 MG PO TABS
3.1250 mg | ORAL_TABLET | Freq: Two times a day (BID) | ORAL | Status: DC
  Administered 2017-09-04 – 2017-09-06 (×5): 3.125 mg via ORAL
  Filled 2017-09-04 (×5): qty 1

## 2017-09-04 NOTE — Progress Notes (Signed)
Patient ID: Derrick Marsh, male   DOB: [birth year], 72 y.o.   MRN: [id number]     Advanced Heart Failure Rounding Note  PCP-Cardiologist: No primary care provider on file.   Subjective:    Co-ox 70% off milrinone and in NSR.  Seems to do much better in NSR. Still on amiodarone 60 mg/hr.   Creatinine 2.62 => 2.75.   I/Os -1500 cc net on torsemide 60 mg daily yesterday.  CVP 3 this morning.    No dyspnea or chest pain this morning.   CXR with LUL PNA.  He is on cefdinir now for this, ?aspiration. He completed a course of azithromycin already.   Objective:   Weight Range: 100 lb 14.4 oz (45.8 kg) Body mass index is 18.45 kg/m.   Vital Signs:   Temp:  [97.7 F (36.5 C)-98.2 F (36.8 C)] 97.7 F (36.5 C) (06/22 0805) Pulse Rate:  [45-86] 71 (06/22 0805) Resp:  [10-20] 19 (06/22 0805) BP: (102-133)/(63-90) 133/83 (06/22 0805) SpO2:  [98 %-100 %] 99 % (06/22 0805) Last BM Date: 09/01/17  Weight change: Filed Weights   08/31/17 0500 09/02/17 0600 09/03/17 0500  Weight: 111 lb 1.8 oz (50.4 kg) 106 lb 11.2 oz (48.4 kg) 100 lb 14.4 oz (45.8 kg)    Intake/Output:   Intake/Output Summary (Last 24 hours) at 09/04/2017 1204 Last data filed at 09/04/2017 0730 Gross per 24 hour  Intake 1327.15 ml  Output 2625 ml  Net -1297.85 ml      Physical Exam    General: NAD Neck: No JVD, no thyromegaly or thyroid nodule.  Lungs: Clear to auscultation bilaterally with normal respiratory effort. CV: Nondisplaced PMI.  Heart regular S1/S2, no S3/S4, no murmur.  No peripheral edema.   Abdomen: Soft, nontender, no hepatosplenomegaly, no distention.  Skin: Intact without lesions or rashes.  Neurologic: Alert and oriented x 3.  Psych: Normal affect. Extremities: No clubbing or cyanosis.  HEENT: Normal.   Telemetry   NSR 60s, had short run aflutter earlier in am.  Personally reviewed.   Labs    CBC Recent Labs    09/03/17 0410 09/04/17 0500  WBC 10.9* 13.3*  NEUTROABS 9.2* 10.3*    HGB 10.5* 11.7*  HCT 32.2* 34.9*  MCV 93.1 91.8  PLT 239 267   Basic Metabolic Panel Recent Labs    40/98/11 0410 09/04/17 0500  NA 134* 137  K 3.1* 3.2*  CL 89* 89*  CO2 33* 35*  GLUCOSE 134* 129*  BUN 76* 71*  CREATININE 2.62* 2.75*  CALCIUM 9.0 9.5   Liver Function Tests Recent Labs    09/03/17 0410 09/04/17 0500  AST 66* 47*  ALT 289* 218*  ALKPHOS 102 105  BILITOT 1.0 1.0  PROT 6.1* 6.5  ALBUMIN 3.3* 3.4*   No results for input(s): LIPASE, AMYLASE in the last 72 hours. Cardiac Enzymes No results for input(s): CKTOTAL, CKMB, CKMBINDEX, TROPONINI in the last 72 hours.  BNP: BNP (last 3 results) Recent Labs    08/27/17 2239 08/31/17 0313  BNP 3,310.3* 1,505.7*    ProBNP (last 3 results) No results for input(s): PROBNP in the last 8760 hours.   D-Dimer No results for input(s): DDIMER in the last 72 hours. Hemoglobin A1C No results for input(s): HGBA1C in the last 72 hours. Fasting Lipid Panel No results for input(s): CHOL, HDL, LDLCALC, TRIG, CHOLHDL, LDLDIRECT in the last 72 hours. Thyroid Function Tests No results for input(s): TSH, T4TOTAL, T3FREE, THYROIDAB in the last 72 hours.  Invalid input(s): FREET3  Other results:   Imaging    No results found.   Medications:     Scheduled Medications: . apixaban  2.5 mg Oral BID  . atorvastatin  40 mg Oral q1800  . cefdinir  300 mg Oral Daily  . Chlorhexidine Gluconate Cloth  6 each Topical Daily  . insulin aspart  0-5 Units Subcutaneous QHS  . insulin aspart  0-9 Units Subcutaneous TID WC  . nicotine  21 mg Transdermal Daily  . pantoprazole  40 mg Oral Daily  . sodium chloride flush  10-40 mL Intracatheter Q12H    Infusions: . amiodarone 60 mg/hr (09/04/17 1152)  . norepinephrine (LEVOPHED) Adult infusion Stopped (08/31/17 1459)    PRN Medications: acetaminophen, colchicine, dextromethorphan-guaiFENesin, ipratropium, levalbuterol, nitroGLYCERIN, ondansetron (ZOFRAN) IV, sodium  chloride flush   Assessment/Plan   1. Acute systolic CHF/cardiogenic shock: Echo with EF 20-25%, moderate RV systolic dysfunction.  We do not know his baseline EF, no echo in system.  He has history of CABG, so ischemic cardiomyopathy is possible.  No chest pain.  TnI was mildly elevated at 0.09 but likely demand ischema with volume overload/hypotension/tachycardia.  Tachycardia-mediated cardiomyopathy is certainly possible, he was admitted in atypical flutter with RVR, unsure of how long he had been in it.  He was cardioverted on 6/17 but was back in atrial flutter with RVR 6/18.  He was hypotensive post-TEE 6/17, required intubation and phenylephrine.  He is now extubated and off phenylephrine.  Milrinone started with low co-ox on 6/18, decreased to 0.125 with elevated HR in atrial flutter.  Milrinone was stopped on 6/20.  He has diuresed well on IV Lasix and now torsemide.  He has been in and out of atrial flutter over the last few days, with more amiodarone he has been mostly NSR recently.  He tolerates NSR much better.  Co-ox 70% in NSR today with CVP 3.  - Got torsemide this morning, will stop.  Follow CVP/exam for future diuretic need.  - No ACEI/ARB/ARNI/digoxin/spironolactone with AKI.  - Good co-ox and CVP, will add low dose Coreg 3.125 mg bid.  - Cannot rule out progression of CAD with ischemic CMP though doubt ACS this admission.  Creatinine too high for cath though needs eventually if creatinine comes down.   - Would not be candidate for advanced therapies currently with AKI.  2. Atrial flutter: With RVR.  Atypical flutter.  DCCV 6/17 but did not hold, back in flutter with RVR 6/18.  He was admitted with aflutter/RVR, suspicion for tachy-mediated CMP.  He has been in and out of flutter on amiodarone, with more amiodarone he is now mostly in NSR.  Needs NSR, much better tolerated.  - Can decrease amiodarone to 30 mg/hr today.  Would continue IV today, if stays in NSR can transition to 400 mg  po bid tomorrow.  - If more flutter, can add ranolazine.   - Now on Eliquis.  - EP has seen. Not good candidate for ablation per Dr Elberta Fortis with atypical flutter and severely dilated atria.  3. AKI: On suspected CKD stage 3: Creatinine 1.6 in 2018.  Had small kidneys suggestive of medical renal disease on last [place].  Suspect AKI due to hypotension/low output/cardiorenal syndrome but not sure what his baseline is. Creatinine has trended down from peak.  - Creatinine slightly higher today (still down from admission), hold torsemide with CVP 3.  4. Elevated LFTs: Possible shock liver in setting of hypotension/low output.  LFTs trending down.  -  Statin restarted.   - Will have to use amiodarone but will monitor LFTs closely.  5. CAD: s/p CABG 2000: No cath since that time.  See discussion above, cannot rule out ischemic cardiomyopathy but doubt ACS => TnI 0.06 with no trend.  - He is on atorvastatin.  - No ASA with apixaban use.  6. COPD exacerbation with LUL PNA:  He has completed azithromycin and and was transitioned to ceftriaxone alone. Now on cefdinir, complete 1 week of ceftriaxone => cefdinir.   Walk in halls, possibly home by Monday if he stays in NSR.   Marca Anconaalton Toniqua Melamed, MD  09/04/2017, 12:04 PM  Advanced Heart Failure Team Pager [phone] (M-F; 7a - 4p)  Please contact [facility] for night-coverage after hours (4p -7a ) and weekends on [email or web address]  P

## 2017-09-04 NOTE — Progress Notes (Signed)
[facility] Progress Note    Assessment/ Plan:   1. AoCKD3; follows at our office (prev [facility], no visit since his retirement) SCr was 1.6 11/2016.  Renal US with small, shrunken kidneys, L > R.  He does not have lots of reserve. Cr improving with improved cardiac output and more effective diuresis.  Of note, he is a poor iHD candidate at present based on his suboptimal hemodynamics and severely depressed EF. Fortunately there are no needs for dialysis at this time.  Cr settling out at 2.6-2.7.  Holding torsemide this AM per AHF.  I will sign off at this time- nothing else to add currently.  Will arrange f/u in clinic with me in the next 2-3 weeks after discharge.   2. Acute systolic CHF: EF 16-10% by TEE, severely dilated LA.  Advanced CHF following- milrinone d/c'd, holding torsemide as above  3. Aflutter: DCCV unsuccessful, now back in NSR on amio gtt, continue on hep gtt.  Per cardiology/ AHF.  Not an ablation candidate  4. CAP: s/p azithromycin but increasing infiltrate- on CTX now (6/19)  5. Elevated TSH: TSH > 10 6/17-- per primary  6. Elevated LFTs: I suspect shock liver/ possible congestive hepatopathy, per primary, improving  7. Mild metabolic acidosis:  After hypotension, now is resolved  8. Dispo: in SDU   Subjective:    CVP is 3 this AM.  Lying flat in bed.  dtr at bedside.  Doing well.     Objective:   BP (!) 116/58   Pulse 69   Temp 97.7 F (36.5 C)   Resp 14   Ht 5\' 2"  (1.575 m)   Wt 45.8 kg (100 lb 14.4 oz)   SpO2 100%   BMI 18.45 kg/m   Intake/Output Summary (Last 24 hours) at 09/04/2017 1327 Last data filed at 09/04/2017 1000 Gross per 24 hour  Intake 1567.15 ml  Output 2625 ml  Net -1057.85 ml   Weight change:   Physical Exam: GEN:No acute distress, lying flat in bed RUE:AVWU, no JVD EYES:EOMI CV:rrr no m/r/g PULM:L > R inspiratory crackles, improved, esp in upper lung fields JWJ:XBJY, nontender, no suprapubic distention  or tenderness, bowel sounds present SKIN:heavily tattooed EXT:No edema    Imaging: No results found.  Labs: BMET Recent Labs  Lab 08/30/17 0438 08/30/17 1941 08/31/17 0313 09/01/17 0455 09/02/17 0500 09/03/17 0410 09/04/17 0500  NA 139 142 139 135 136 134* 137  K 4.4 5.0 4.4 3.8 3.3* 3.1* 3.2*  CL 101 107 103 101 93* 89* 89*  CO2 26 19* 20* 22 28 33* 35*  GLUCOSE 134* 114* 133* 165* 146* 134* 129*  BUN 79* 84* 87* 92* 86* 76* 71*  CREATININE 2.71* 2.96* 3.03* 3.17* 3.08* 2.62* 2.75*  CALCIUM 9.3 8.8* 8.2* 8.4* 9.1 9.0 9.5  PHOS  --   --   --  6.4*  --   --   --    CBC Recent Labs  Lab 09/01/17 0455 09/02/17 0500 09/03/17 0410 09/04/17 0500  WBC 12.2* 14.3* 10.9* 13.3*  NEUTROABS 10.6* 11.9* 9.2* 10.3*  HGB 9.6* 9.9* 10.5* 11.7*  HCT 29.7* 30.1* 32.2* 34.9*  MCV 96.1 93.8 93.1 91.8  PLT 254 244 239 267    Medications:    . apixaban  2.5 mg Oral BID  . atorvastatin  40 mg Oral q1800  . carvedilol  3.125 mg Oral BID WC  . cefdinir  300 mg Oral Daily  . Chlorhexidine Gluconate Cloth  6 each  Topical Daily  . insulin aspart  0-5 Units Subcutaneous QHS  . insulin aspart  0-9 Units Subcutaneous TID WC  . nicotine  21 mg Transdermal Daily  . pantoprazole  40 mg Oral Daily  . sodium chloride flush  10-40 mL Intracatheter Q12H      Derrick ButtnerElizabeth Bunyan Brier, MD 09/04/2017, 1:27 PM

## 2017-09-04 NOTE — Progress Notes (Signed)
Daughter present at the bedside requesting to speak with attending regarding plan of care and when patient maybe ready to discharge as she is going out of town. Paged Cardiology PA who states they will round on patient today. VS currently stable. Patient still on Amiodarone gtt as ordered. Will continue to monitor.

## 2017-09-05 LAB — CBC WITH DIFFERENTIAL/PLATELET
Abs Immature Granulocytes: 0.1 10*3/uL (ref 0.0–0.1)
Basophils Absolute: 0 10*3/uL (ref 0.0–0.1)
Basophils Relative: 0 %
EOS PCT: 3 %
Eosinophils Absolute: 0.4 10*3/uL (ref 0.0–0.7)
HEMATOCRIT: 34.1 % — AB (ref 39.0–52.0)
HEMOGLOBIN: 11.1 g/dL — AB (ref 13.0–17.0)
Immature Granulocytes: 0 %
LYMPHS ABS: 0.8 10*3/uL (ref 0.7–4.0)
LYMPHS PCT: 6 %
MCH: 29.9 pg (ref 26.0–34.0)
MCHC: 32.6 g/dL (ref 30.0–36.0)
MCV: 91.9 fL (ref 78.0–100.0)
MONO ABS: 1.7 10*3/uL — AB (ref 0.1–1.0)
MONOS PCT: 13 %
Neutro Abs: 10.1 10*3/uL — ABNORMAL HIGH (ref 1.7–7.7)
Neutrophils Relative %: 78 %
Platelets: 262 10*3/uL (ref 150–400)
RBC: 3.71 MIL/uL — ABNORMAL LOW (ref 4.22–5.81)
RDW: 14.7 % (ref 11.5–15.5)
WBC: 13 10*3/uL — ABNORMAL HIGH (ref 4.0–10.5)

## 2017-09-05 LAB — COMPREHENSIVE METABOLIC PANEL
ALBUMIN: 3.1 g/dL — AB (ref 3.5–5.0)
ALK PHOS: 103 U/L (ref 38–126)
ALT: 157 U/L — ABNORMAL HIGH (ref 17–63)
ANION GAP: 13 (ref 5–15)
AST: 36 U/L (ref 15–41)
BILIRUBIN TOTAL: 1.3 mg/dL — AB (ref 0.3–1.2)
BUN: 73 mg/dL — ABNORMAL HIGH (ref 6–20)
CALCIUM: 9.3 mg/dL (ref 8.9–10.3)
CO2: 38 mmol/L — ABNORMAL HIGH (ref 22–32)
Chloride: 84 mmol/L — ABNORMAL LOW (ref 101–111)
Creatinine, Ser: 2.84 mg/dL — ABNORMAL HIGH (ref 0.61–1.24)
GFR calc non Af Amer: 21 mL/min — ABNORMAL LOW (ref >60)
GFR, EST AFRICAN AMERICAN: 24 mL/min — AB (ref >60)
GLUCOSE: 132 mg/dL — AB (ref 65–99)
Potassium: 3 mmol/L — ABNORMAL LOW (ref 3.5–5.1)
Sodium: 135 mmol/L (ref 135–145)
TOTAL PROTEIN: 6.2 g/dL — AB (ref 6.5–8.1)

## 2017-09-05 LAB — COOXEMETRY PANEL
Carboxyhemoglobin: 1.9 % — ABNORMAL HIGH (ref 0.5–1.5)
Methemoglobin: 1 % (ref 0.0–1.5)
O2 Saturation: 85.3 %
TOTAL HEMOGLOBIN: 11.4 g/dL — AB (ref 12.0–16.0)

## 2017-09-05 LAB — GLUCOSE, CAPILLARY
GLUCOSE-CAPILLARY: 131 mg/dL — AB (ref 65–99)
Glucose-Capillary: 119 mg/dL — ABNORMAL HIGH (ref 65–99)
Glucose-Capillary: 160 mg/dL — ABNORMAL HIGH (ref 65–99)
Glucose-Capillary: 143 mg/dL — ABNORMAL HIGH (ref 65–99)

## 2017-09-05 MED ORDER — AMIODARONE HCL 200 MG PO TABS
400.0000 mg | ORAL_TABLET | Freq: Two times a day (BID) | ORAL | Status: DC
  Administered 2017-09-05 – 2017-09-07 (×5): 400 mg via ORAL
  Filled 2017-09-05 (×5): qty 2

## 2017-09-05 NOTE — Progress Notes (Signed)
Patient ID: Derrick Marsh, male   DOB: Nov 04, 1945, 72 y.o.   MRN: [id number]     Advanced Heart Failure Rounding Note  PCP-Cardiologist: No primary care provider on file.   Subjective:    Feels ok. Weak but able to walk halls. No CP or SOB. No palpitations. Anxious to go home.  Lasix stopped yesterday. CVP 5-6 today. On amio at 30/hr. Maintaining NSR in 50-60s.   Co-ox 85% (?) off milrinone  Creatinine 2.62 => 2.75 => 2.84 .    CXR with LUL PNA.  He is on cefdinir now for this, ?aspiration. He completed a course of azithromycin already.   Objective:   Weight Range: 46 kg (101 lb 6.6 oz) Body mass index is 18.55 kg/m.   Vital Signs:   Temp:  [97.5 F (36.4 C)-98.1 F (36.7 C)] 97.9 F (36.6 C) (06/23 1138) Pulse Rate:  [52-74] 52 (06/23 1138) Resp:  [12-18] 17 (06/23 1138) BP: (97-139)/(53-77) 108/56 (06/23 1138) SpO2:  [95 %-100 %] 98 % (06/23 1138) Weight:  [46 kg (101 lb 6.6 oz)] 46 kg (101 lb 6.6 oz) (06/23 0431) Last BM Date: 09/04/17  Weight change: Filed Weights   09/02/17 0600 09/03/17 0500 09/05/17 0431  Weight: 48.4 kg (106 lb 11.2 oz) 45.8 kg (100 lb 14.4 oz) 46 kg (101 lb 6.6 oz)    Intake/Output:   Intake/Output Summary (Last 24 hours) at 09/05/2017 1313 Last data filed at 09/05/2017 1100 Gross per 24 hour  Intake 1127.11 ml  Output 2045 ml  Net -917.89 ml      Physical Exam    General:  Thin elderly male lying in bed. Multiple extensive tattoos. No resp difficulty HEENT: normal Neck: supple. no JVD. Carotids 2+ bilat; no bruits. No lymphadenopathy or thryomegaly appreciated. Cor: PMI nondisplaced. Regular rate & rhythm. No rubs, gallops or murmurs. Lungs: clear with decreased BS throughout Abdomen: soft, nontender, nondistended. No hepatosplenomegaly. No bruits or masses. Good bowel sounds. Extremities: no cyanosis, clubbing, rash, edema Neuro: alert & orientedx3, cranial nerves grossly intact. moves all 4 extremities w/o difficulty. Affect  pleasant  Telemetry   NSR 50-60s,3-beat run NSVT.  Personally reviewed.   Labs    CBC Recent Labs    09/04/17 0500 09/05/17 0440  WBC 13.3* 13.0*  NEUTROABS 10.3* 10.1*  HGB 11.7* 11.1*  HCT 34.9* 34.1*  MCV 91.8 91.9  PLT 267 262   Basic Metabolic Panel Recent Labs    40/98/11 0500 09/05/17 0440  NA 137 135  K 3.2* 3.0*  CL 89* 84*  CO2 35* 38*  GLUCOSE 129* 132*  BUN 71* 73*  CREATININE 2.75* 2.84*  CALCIUM 9.5 9.3   Liver Function Tests Recent Labs    09/04/17 0500 09/05/17 0440  AST 47* 36  ALT 218* 157*  ALKPHOS 105 103  BILITOT 1.0 1.3*  PROT 6.5 6.2*  ALBUMIN 3.4* 3.1*   No results for input(s): LIPASE, AMYLASE in the last 72 hours. Cardiac Enzymes No results for input(s): CKTOTAL, CKMB, CKMBINDEX, TROPONINI in the last 72 hours.  BNP: BNP (last 3 results) Recent Labs    08/27/17 2239 08/31/17 0313  BNP 3,310.3* 1,505.7*    ProBNP (last 3 results) No results for input(s): PROBNP in the last 8760 hours.   D-Dimer No results for input(s): DDIMER in the last 72 hours. Hemoglobin A1C No results for input(s): HGBA1C in the last 72 hours. Fasting Lipid Panel No results for input(s): CHOL, HDL, LDLCALC, TRIG, CHOLHDL, LDLDIRECT in the last  72 hours. Thyroid Function Tests No results for input(s): TSH, T4TOTAL, T3FREE, THYROIDAB in the last 72 hours.  Invalid input(s): FREET3  Other results:   Imaging    No results found.   Medications:     Scheduled Medications: . apixaban  2.5 mg Oral BID  . atorvastatin  40 mg Oral q1800  . carvedilol  3.125 mg Oral BID WC  . cefdinir  300 mg Oral Daily  . Chlorhexidine Gluconate Cloth  6 each Topical Daily  . insulin aspart  0-5 Units Subcutaneous QHS  . insulin aspart  0-9 Units Subcutaneous TID WC  . nicotine  21 mg Transdermal Daily  . pantoprazole  40 mg Oral Daily  . sodium chloride flush  10-40 mL Intracatheter Q12H    Infusions: . amiodarone 30 mg/hr (09/05/17 1100)  .  norepinephrine (LEVOPHED) Adult infusion Stopped (08/31/17 1459)    PRN Medications: acetaminophen, colchicine, dextromethorphan-guaiFENesin, ipratropium, levalbuterol, nitroGLYCERIN, ondansetron (ZOFRAN) IV, sodium chloride flush   Assessment/Plan   1. Acute systolic CHF/cardiogenic shock: Echo with EF 20-25%, moderate RV systolic dysfunction.  We do not know his baseline EF, no echo in system.  He has history of CABG, so ischemic cardiomyopathy is possible.  No chest pain.  TnI was mildly elevated at 0.09 but likely demand ischema with volume overload/hypotension/tachycardia.  Tachycardia-mediated cardiomyopathy is certainly possible, he was admitted in atypical flutter with RVR, unsure of how long he had been in it.  He was cardioverted on 6/17 but was back in atrial flutter with RVR 6/18.  He was hypotensive post-TEE 6/17, required intubation and phenylephrine.  He is now extubated and off phenylephrine.  Milrinone started with low co-ox on 6/18, decreased to 0.125 with elevated HR in atrial flutter.  Milrinone was stopped on 6/20.  He has diuresed well on IV Lasix and now torsemide.  He has been in and out of atrial flutter over the last few days, with more amiodarone he has been mostly NSR recently.  He tolerates NSR much better.   - Diuretics stopped 6/22. Co-ox 85% in NSR today with CVP 3-> 5 - No ACEI/ARB/ARNI/digoxin/spironolactone with AKI.  - Tolerating low dose Coreg 3.125 mg bid.  - Cannot rule out progression of CAD with ischemic CMP though doubt ACS this admission.  Creatinine too high for cath though needs eventually if creatinine comes down.   - Would not be candidate for advanced therapies currently with CKD.  2. Atrial flutter: With RVR.  Atypical flutter.  DCCV 6/17 but did not hold, back in flutter with RVR 6/18.  He was admitted with aflutter/RVR, suspicion for tachy-mediated CMP.  He has been in and out of flutter on amiodarone, with more amiodarone he is now mostly in NSR.   Needs NSR, much better tolerated.  - Remains in NSR on amiodarone 30 mg/hr today. Transition to amio 400 bid - If more flutter, can add ranolazine.   - Now on Eliquis. No bleeding.  - EP has seen. Not good candidate for ablation per Dr Elberta Fortis with atypical flutter and severely dilated atria.  3. AKI: On suspected CKD stage 3: Creatinine 1.6 in 2018.  Had small kidneys suggestive of medical renal disease on last [place].  Suspect AKI due to hypotension/low output/cardiorenal syndrome but not sure what his baseline is. Creatinine has trended down from peak.  - Creatinine slightly higher again today. CVP 5. Continue to hold diuretics. Will likely need some on d/c 4. Elevated LFTs: Possible shock liver in setting of hypotension/low  output.  LFTs trending down.  - Statin restarted.   - Will have to use amiodarone but will monitor LFTs closely.  5. CAD: s/p CABG 2000: No cath since that time.  See discussion above, cannot rule out ischemic cardiomyopathy but doubt ACS => TnI 0.06 with no trend.  - He is on atorvastatin.  - No ASA with apixaban use.  6. COPD exacerbation with LUL PNA:  He has completed azithromycin and and was transitioned to ceftriaxone alone. Now on cefdinir, complete 1 week of ceftriaxone => cefdinir.   Walk in halls, possibly home 1 week.  if he stays in NSR.   Arvilla Meresaniel Jeriann Sayres, MD  09/05/2017, 1:13 PM  Advanced Heart Failure Team Pager [phone] (M-F; 7a - 4p)  Please contact [facility] for night-coverage after hours (4p -7a ) and weekends on [email or web address]  P

## 2017-09-06 ENCOUNTER — Inpatient Hospital Stay (HOSPITAL_COMMUNITY): Payer: Medicare Other

## 2017-09-06 LAB — COMPREHENSIVE METABOLIC PANEL
ALK PHOS: 106 U/L (ref 38–126)
ALT: 124 U/L — ABNORMAL HIGH (ref 17–63)
ANION GAP: 11 (ref 5–15)
AST: 35 U/L (ref 15–41)
Albumin: 3.1 g/dL — ABNORMAL LOW (ref 3.5–5.0)
BUN: 81 mg/dL — ABNORMAL HIGH (ref 6–20)
CALCIUM: 9.3 mg/dL (ref 8.9–10.3)
CO2: 39 mmol/L — ABNORMAL HIGH (ref 22–32)
Chloride: 83 mmol/L — ABNORMAL LOW (ref 101–111)
Creatinine, Ser: 2.92 mg/dL — ABNORMAL HIGH (ref 0.61–1.24)
GFR calc non Af Amer: 20 mL/min — ABNORMAL LOW (ref >60)
GFR, EST AFRICAN AMERICAN: 23 mL/min — AB (ref >60)
Glucose, Bld: 106 mg/dL — ABNORMAL HIGH (ref 65–99)
Potassium: 3.1 mmol/L — ABNORMAL LOW (ref 3.5–5.1)
SODIUM: 133 mmol/L — AB (ref 135–145)
Total Bilirubin: 1.1 mg/dL (ref 0.3–1.2)
Total Protein: 6.1 g/dL — ABNORMAL LOW (ref 6.5–8.1)

## 2017-09-06 LAB — COOXEMETRY PANEL
CARBOXYHEMOGLOBIN: 1.7 % — AB (ref 0.5–1.5)
Carboxyhemoglobin: 1.5 % (ref 0.5–1.5)
METHEMOGLOBIN: 0.9 % (ref 0.0–1.5)
Methemoglobin: 1.1 % (ref 0.0–1.5)
O2 SAT: 59.1 %
O2 Saturation: 50.8 %
TOTAL HEMOGLOBIN: 11.3 g/dL — AB (ref 12.0–16.0)
TOTAL HEMOGLOBIN: 11.3 g/dL — AB (ref 12.0–16.0)

## 2017-09-06 LAB — CBC WITH DIFFERENTIAL/PLATELET
Abs Immature Granulocytes: 0.1 10*3/uL (ref 0.0–0.1)
BASOS ABS: 0 10*3/uL (ref 0.0–0.1)
Basophils Relative: 0 %
EOS PCT: 4 %
Eosinophils Absolute: 0.6 10*3/uL (ref 0.0–0.7)
HCT: 32.6 % — ABNORMAL LOW (ref 39.0–52.0)
HEMOGLOBIN: 10.8 g/dL — AB (ref 13.0–17.0)
Immature Granulocytes: 1 %
LYMPHS PCT: 7 %
Lymphs Abs: 1 10*3/uL (ref 0.7–4.0)
MCH: 30.3 pg (ref 26.0–34.0)
MCHC: 33.1 g/dL (ref 30.0–36.0)
MCV: 91.6 fL (ref 78.0–100.0)
Monocytes Absolute: 1.7 10*3/uL — ABNORMAL HIGH (ref 0.1–1.0)
Monocytes Relative: 12 %
NEUTROS ABS: 11.5 10*3/uL — AB (ref 1.7–7.7)
Neutrophils Relative %: 76 %
PLATELETS: 259 10*3/uL (ref 150–400)
RBC: 3.56 MIL/uL — ABNORMAL LOW (ref 4.22–5.81)
RDW: 14.8 % (ref 11.5–15.5)
WBC: 14.9 10*3/uL — AB (ref 4.0–10.5)

## 2017-09-06 LAB — GLUCOSE, CAPILLARY
GLUCOSE-CAPILLARY: 112 mg/dL — AB (ref 65–99)
Glucose-Capillary: 130 mg/dL — ABNORMAL HIGH (ref 65–99)
Glucose-Capillary: 108 mg/dL — ABNORMAL HIGH (ref 65–99)
Glucose-Capillary: 177 mg/dL — ABNORMAL HIGH (ref 65–99)

## 2017-09-06 MED ORDER — HEPARIN SOD (PORK) LOCK FLUSH 100 UNIT/ML IV SOLN: 250.0000 [IU] | INTRAVENOUS | Status: DC | PRN

## 2017-09-06 MED ORDER — POTASSIUM CHLORIDE CRYS ER 20 MEQ PO TBCR
40.0000 meq | EXTENDED_RELEASE_TABLET | Freq: Once | ORAL | Status: AC
  Administered 2017-09-06: 40 meq via ORAL
  Filled 2017-09-06: qty 2

## 2017-09-06 NOTE — Care Management Important Message (Signed)
Important Message  Patient Details  Name: Derrick Marsh MRN: [id number] Date of Birth: [birth year]   Medicare Important Message Given:  Yes    [name] 09/06/2017, 3:42 PM

## 2017-09-06 NOTE — Progress Notes (Signed)
Pt returned from radiology via WC.  No s/s of distress.  Will continue to monitor.

## 2017-09-06 NOTE — Discharge Summary (Addendum)
Advanced Heart Failure Discharge Note  Discharge Summary   Patient ID: Derrick Marsh MRN: [id number], DOB/AGE: March 04, 1946 72 y.o. Admit date: 08/27/2017 D/C date:     09/07/2017   Primary Discharge Diagnoses:  1. Acute systolic heart failure -> cardiogenic shock - Required milrinone and IV lasix 2. Atrial flutter RVR - S/p TEE/DCCV 08/30/17 - Continue amiodarone 400 mg BID through Sunday, 6/30, then 200 mg BID 3. AKI on ?CKD 3  4. Elevated LFTs 5. CAD s/p CABG 2000 6. COPD exacerbation with LUL PNA - Completes Cefdinir 6/26 7. Hypokalemia  Hospital Course: Derrick Marsh is a 72 y.o. male with a history of hypertension, hyperlipidemia, COPD, gout, CAD, CABG in 2000, PAD, tobacco abuse, and PVCs. He was seen by his PCP on 6/14 and found to be in afib/flutter and advised to go [facility] for further evaluation. Treated for volume overloaded and pneumonia.  Echo showed newly reduced EF 20-25% with mod RV dysfunction (no previous echo on file). Diuresed with IV lasix. He was on milrinone for low co-ox, which was stopped on 6/20. He transitioned to torsemide, but then torsemide was held due to worsening AKI. CM is possibly tachy-mediated from aflutter RVR. There was concern for CAD progression, but creatinine was too high for LHC during admission. He did not have any CP and troponin was 0.06, so ACS thought unlikely. No BB with marginal co-ox. HF medications limited by AKI and soft BP. Transitioned to lasix 80 mg daily.   He underwent TEE/DCCV into NSR on 08/30/17. EF 15% per TEE. Procedure was complicated by hypotension, requiring phenylephrine, and respiratory distress, requiring brief intubation. He was back in atrial flutter the next day and was started on amiodarone drip. He converted to NSR with amiodarone. EP was consulted for possible ablation, but not thought to be a good candidate due to atypical flutter and severely dilated atria. He transitioned to PO amiodarone and eliquis prior to  discharge.   He was additionally treated for ?aspiration pneumonia and COPD exacerbation. He completed a course of azithromycin. He will complete Cefdinir outpatient. He had leukocytosis on day of discharge, but was trending down. He was afebrile. CXR prior to discharge showed improving pneumonia. Check CBC at follow up. He qualified for home oxygen, which has been ordered.  Course complicated by AKI. Creatinine baseline appears to be around 1.6. Peaked at 3.17. Nephrology was consulted. He is a poor HD candidate. [facility] will follow up with him outpatient.   He had elevated liver enzymes in the setting of cardiogenic shock. This resolved prior to discharge. Atorvastatin was restarted and LFTs remained normal. He will need close monitoring with addition of amiodarone.   He has been referred to cardiac rehab. Home health PT has been arranged. He has been given Eliquis 30 day card. Home oxygen has been ordered.   [facility] will [facility] with [facility]. I called their office and they plan to call patient to schedule appointment. Provided patient with their contact info as well.   Patient will be followed closely in the HF clinic, with follow up appointment as below. He will need a CBC, BMET, and EKG at follow up. Will have him come get labs next week.  Discharge Weight Range: 101 lbs Discharge Vitals: Blood pressure 125/79, pulse (!) 56, temperature 97.9 F (36.6 C), temperature source Oral, resp. rate 18, height 5\' 2"  (1.575 m), weight 101 lb 3.1 oz (45.9 kg), SpO2 99 %.  Labs: Lab Results  Component Value  Date   WBC 13.1 (H) 09/07/2017   HGB 11.0 (L) 09/07/2017   HCT 33.9 (L) 09/07/2017   MCV 92.4 09/07/2017   PLT 269 09/07/2017    Recent Labs  Lab 09/07/17 0432  NA 134*  K 3.7  CL 84*  CO2 37*  BUN 73*  CREATININE 2.88*  CALCIUM 9.1  PROT 5.9*  BILITOT 1.5*  ALKPHOS 103  ALT 106*  AST 39  GLUCOSE 102*   Lab Results  Component Value Date   CHOL 119  08/28/2017   HDL 32 (L) 08/28/2017   LDLCALC 79 08/28/2017   TRIG 41 08/28/2017   BNP (last 3 results) Recent Labs    08/27/17 2239 08/31/17 0313  BNP 3,310.3* 1,505.7*    ProBNP (last 3 results) No results for input(s): PROBNP in the last 8760 hours.   Diagnostic Studies/Procedures   Echo 08/28/17: - Left ventricle: The cavity size was normal. Wall thickness was   normal. Systolic function was severely reduced. The estimated   ejection fraction was in the range of 20% to 25%. Diffuse   hypokinesis. - Regional wall motion abnormality: Severe hypokinesis of the   basal-mid inferolateral myocardium. - Aortic valve: Mildly calcified annulus. Trileaflet; moderately   thickened leaflets. Valve area (VTI): 1.59 cm^2. Valve area   (Vmax): 1.54 cm^2. Valve area (Vmean): 1.63 cm^2. - Mitral valve: Mildly calcified annulus. Mildly thickened leaflets   . There was mild regurgitation. Valve area by pressure half-time:   1.16 cm^2. - Left atrium: The atrium was severely dilated. - Right ventricle: The cavity size was mildly to moderately   dilated. Systolic function was moderately reduced. - Right atrium: The atrium was moderately dilated. - Atrial septum: No defect or patent foramen ovale was identified. - Tricuspid valve: There was mild-moderate regurgitation. - Pulmonary arteries: Systolic pressure was moderately increased.   PA peak pressure: 40 mm Hg (S).  TEE 08/30/17: - Left ventricle: The cavity size is mildly dilated. Wall thickness   is normal. Systolic function was normal. The estimated ejection   fraction was 15%. Diffuse hypokinesis. - Aortic valve: No evidence of vegetation. - Mitral valve: Moderate regurgitation. - Left atrium: Severely dilated. No evidence of thrombus in the   atrial cavity or appendage. - Right atrium: Moderately dilated. - Atrial septum: No defect or patent foramen ovale was identified. - Tricuspid valve: There was mild regurgitation. Peak  RV-RA   gradient (S): 21 mm Hg + RAP.  Discharge Medications   Allergies as of 09/07/2017      Reactions   Aleve [naproxen] Other (See Comments)   Was told by a MD to not take this; conflicted with his other meds      Medication List    STOP taking these medications   aspirin EC 81 MG tablet   metoprolol tartrate 25 MG tablet Commonly known as:  LOPRESSOR     TAKE these medications   amiodarone 400 MG tablet Commonly known as:  PACERONE Take 1 tablet (400 mg total) by mouth 2 (two) times daily for 5 days.   amiodarone 200 MG tablet Commonly known as:  PACERONE Take 1 tablet (200 mg total) by mouth 2 (two) times daily. Start taking on:  09/13/2017   apixaban 2.5 MG Tabs tablet Commonly known as:  ELIQUIS Take 1 tablet (2.5 mg total) by mouth 2 (two) times daily.   atorvastatin 40 MG tablet Commonly known as:  LIPITOR TAKE 1 TABLET EVERY DAY   colchicine 0.6 MG tablet Take  0.6 mg by mouth daily as needed (for gout flares).   furosemide 80 MG tablet Commonly known as:  LASIX Take 1 tablet (80 mg total) by mouth daily.   nicotine 21 mg/24hr patch Commonly known as:  NICODERM CQ - dosed in mg/24 hours Place 1 patch (21 mg total) onto the skin daily.   potassium chloride 10 MEQ tablet Commonly known as:  K-DUR Take 2 tablets (20 mEq total) by mouth daily.            Durable Medical Equipment  (From admission, onward)        Start     Ordered   09/07/17 1153  For home use only DME oxygen  Once    Question Answer Comment  Mode or (Route) Nasal cannula   Liters per Minute 2   Frequency Continuous (stationary and portable oxygen unit needed)   Oxygen conserving device Yes   Oxygen delivery system Gas      09/07/17 1152   09/07/17 0844  Heart failure home health orders  (Heart failure home health orders / Face to face)  Once    Comments:  Heart Failure Follow-up Care:  Verify follow-up appointments per Patient Discharge Instructions. Confirm transportation  arranged. Reconcile home medications with discharge medication list. Remove discontinued medications from use. Assist patient/caregiver to manage medications using pill box. Reinforce low sodium food selection Assessments: Vital signs and oxygen saturation at each visit. Assess home environment for safety concerns, caregiver support and availability of low-sodium foods. Consult Child psychotherapist, PT/OT, Dietitian, and CNA based on assessments. Perform comprehensive cardiopulmonary assessment. Notify MD for any change in condition or weight gain of 3 pounds in one day or 5 pounds in one week with symptoms. Daily Weights and Symptom Monitoring: Ensure patient has access to scales. Teach patient/caregiver to weigh daily before breakfast and after voiding using same scale and record.    Teach patient/caregiver to track weight and symptoms and when to notify Provider. Activity: Develop individualized activity plan with patient/caregiver.   Question Answer Comment  Heart Failure Follow-up Care Advanced Heart Failure (AHF) Clinic at [phone]   Obtain the following labs Basic Metabolic Panel   Lab frequency Other see comments   Fax lab results to AHF Clinic at [phone]   Diet Low Sodium Heart Healthy   Fluid restrictions: 2000 mL Fluid   Skilled Nurse to notify MD of weight trends weekly for first 2 weeks. May fax or call: AHF Clinic at [phone] (fax) or [phone]   Initiate Heart Failure Clinic Diuretic Protocol to be used by [facility] only ( to be ordered by Heart Failure Team Providers Only) Yes      09/07/17 0848      Disposition   The patient will be discharged in stable condition to home. Discharge Instructions    (HEART FAILURE PATIENTS) Call MD:  Anytime you have any of the following symptoms: 1) 3 pound weight gain in 24 hours or 5 pounds in 1 week 2) shortness of breath, with or without a dry hacking cough 3) swelling in the hands, feet or stomach 4) if you have  to sleep on extra pillows at night in order to breathe.   Complete by:  As directed    Call MD for:  difficulty breathing, headache or visual disturbances   Complete by:  As directed    Call MD for:  persistant dizziness or light-headedness   Complete by:  As directed    Call MD for:  temperature >100.4   Complete by:  As directed    Diet - low sodium heart healthy   Complete by:  As directed    2 L fluid restriction.   Heart Failure patients record your daily weight using the same scale at the same time of day   Complete by:  As directed    Increase activity slowly   Complete by:  As directed    STOP any activity that causes chest pain, shortness of breath, dizziness, sweating, or exessive weakness   Complete by:  As directed      Follow-up Information    [facility] Follow up on 10/05/2017.   Specialty:  Cardiology Why:  Heart Failure Followup at [facility]-10:00-Parking at ER lot (enter under blue "Specialty Clinics" awning) or under [facility] on [address] (construction entrance, garage code: 1400, elevator 1st floor). Take all am meds, bring all med bottles. Contact information: [address] [id number] mc [place] [address] [phone]       Bufford Buttner, MD Follow up.   Specialty:  Nephrology Why:  They will call you with follow up appointment. If you do not hear from them within the next couple days, please call to schedule appointment.  Contact information: [address] [place] [address] [phone]        Tonye Becket D, NP Follow up on 09/14/2017.   Specialty:  Cardiology Why:  Heart failure clinic. 11 am for labs only.  Parking at ER lot (enter under blue "Specialty Clinics" awning) or under [facility] on [address] (construction entrance, garage code: 1400, elevator 1st floor).  Contact information: [address] [place] [address] [phone]              Duration of Discharge Encounter: Greater than 35 minutes   Signed, Alford Highland, NP 09/07/2017, 12:40 PM   Patient seen and examined with the above-signed Advanced Practice Provider and/or Housestaff. I personally reviewed laboratory data, imaging studies and relevant notes. I independently examined the patient and formulated the important aspects of the plan. I have edited the note to reflect any of my changes or salient points. I have personally discussed the plan with the patient and/or family.  He is ready for discharge today. See my rounding note from earlier today for full details. Will need close f/u in HF Clinic.   Arvilla Meres, MD  7:06 PM

## 2017-09-06 NOTE — Progress Notes (Signed)
Physical Therapy Treatment Patient Details Name: Derrick Marsh MRN: [id number] DOB: September 10, 1945 Today's Date: 09/06/2017    History of Present Illness Derrick Marsh is a 72 y.o. male with medical history significant of hypertension, hyperlipidemia, COPD, gout, CAD, CABG, PAD, tobacco abuse, PVC, who presents with shortness of breath.    PT Comments    Pt tolerated increased ambulation distance today and negotiated steps with min guard. Unable to get SpO2 reading during ambulation on 2L O2. However, pt with no c/o SOB or dizziness. Patient would benefit from continued skilled PT to increase functional independence and activity tolerance. Will continue to follow acutely.      Follow Up Recommendations  Home health PT     Equipment Recommendations  None recommended by PT    Recommendations for Other Services       Precautions / Restrictions Precautions Precaution Comments: watch O2  Restrictions Weight Bearing Restrictions: No    Mobility  Bed Mobility Overal bed mobility: Modified Independent                Transfers Overall transfer level: Needs assistance Equipment used: Rolling walker (2 wheeled) Transfers: Sit to/from Stand Sit to Stand: Min guard         General transfer comment: min guard for safety, line mgt with RW  Ambulation/Gait Ambulation/Gait assistance: Min guard Gait Distance (Feet): 300 Feet Assistive device: Rolling walker (2 wheeled) Gait Pattern/deviations: Step-through pattern;Trunk flexed Gait velocity: decreased   General Gait Details: VC required for RW proximity and postural control. On 2L O2 during ambulation. Pulse ox not picking up SpO2 reading druing ambulation. Pt w/o SOB or symptoms of low SpO2.   Stairs Stairs: Yes Stairs assistance: Min guard Stair Management: Two rails;Step to pattern;Forwards Number of Stairs: 3 General stair comments: Pt able to negotiate steps with min guard for safety and line management.     Wheelchair Mobility    Modified Rankin (Stroke Patients Only)       Balance Overall balance assessment: Needs assistance   Sitting balance-Leahy Scale: Good     Standing balance support: No upper extremity supported;Bilateral upper extremity supported Standing balance-Leahy Scale: Fair Standing balance comment: Pt able to stand statically w/o UE support. Requires support for functional activity.                            Cognition Arousal/Alertness: Awake/alert Behavior During Therapy: WFL for tasks assessed/performed Overall Cognitive Status: Within Functional Limits for tasks assessed                                        Exercises      General Comments        Pertinent Vitals/Pain Pain Assessment: No/denies pain    Home Living                      Prior Function            PT Goals (current goals can now be found in the care plan section) Acute Rehab PT Goals PT Goal Formulation: With patient Time For Goal Achievement: 09/09/17 Potential to Achieve Goals: Good Progress towards PT goals: Progressing toward goals    Frequency    Min 3X/week      PT Plan Current plan remains appropriate    Co-evaluation  AM-PAC PT "6 Clicks" Daily Activity  Outcome Measure  Difficulty turning over in bed (including adjusting bedclothes, sheets and blankets)?: None Difficulty moving from lying on back to sitting on the side of the bed? : None Difficulty sitting down on and standing up from a chair with arms (e.g., wheelchair, bedside commode, etc,.)?: Unable Help needed moving to and from a bed to chair (including a wheelchair)?: A Little Help needed walking in hospital room?: A Little Help needed climbing 3-5 steps with a railing? : A Little 6 Click Score: 18    End of Session Equipment Utilized During Treatment: Gait belt;Oxygen Activity Tolerance: Patient tolerated treatment well Patient left: in  bed;with call bell/phone within reach Nurse Communication: Mobility status PT Visit Diagnosis: Unsteadiness on feet (R26.81);Difficulty in walking, not elsewhere classified (R26.2)     Time: 1256-1330 PT Time Calculation (min) (ACUTE ONLY): 34 min  Charges:  $Gait Training: 23-37 mins                    G Codes:      Kallie LocksHannah Remberto Lienhard, [place]PT[place] Pager [phone] Acute Rehab  Sheral ApleyHannah E Adolf Ormiston 09/06/2017, 1:43 PM

## 2017-09-06 NOTE — Progress Notes (Signed)
CARDIAC REHAB PHASE I   PRE:  Rate/Rhythm: 47 SB    BP: sitting 96/57    SaO2: 99 2L, 89 RA EOB  MODE:  Ambulation: 350 ft   POST:  Rate/Rhythm: 53 SB    BP: sitting 103/58     SaO2: 100 3L  Pt with flat affect today. No specific c/o. Sts he supposes he is tired of the hospital. Steady walking with RW. SaO2 low on RA on EOB. Used 2L first of walk. SaO2 hard to register, ? 89 2L so increased to 3L. 100 3L after walk. No afib/flutter. To recliner. PT to see later.  [phone]   Harriet MassonRandi Kristan Daved Mcfann CES, ACSM 09/06/2017 11:51 AM

## 2017-09-06 NOTE — Progress Notes (Addendum)
Patient ID: Derrick Marsh, male   DOB: January 11, 1946, 72 y.o.   MRN: [id number]     Advanced Heart Failure Rounding Note  PCP-Cardiologist: No primary care provider on file.   Subjective:    Denies CP, SOB, fever, or chills. Ambulated in hallways with no problem. Eager to go home  Lasix held again yesterday. Weight unchanged. CVP 5. Now on PO amiodarone. Maintaining NSR.  Co-ox 50.8% off milrinone  Creatinine 2.62 => 2.75 => 2.84 => 2.92.    CXR with LUL PNA.  He is on cefdinir now for this, ?aspiration. He completed a course of azithromycin already. WBC trending up. 14.9 this am. Afebrile. No Chills or cough. No dysuria. No arthralgias.   Objective:   Weight Range: 101 lb 6.6 oz (46 kg) Body mass index is 18.55 kg/m.   Vital Signs:   Temp:  [97.6 F (36.4 C)-98 F (36.7 C)] 97.8 F (36.6 C) (06/24 0351) Pulse Rate:  [47-58] 56 (06/24 0351) Resp:  [10-20] 14 (06/23 1800) BP: (96-135)/(51-65) 105/62 (06/24 0351) SpO2:  [83 %-100 %] 90 % (06/24 0351) Weight:  [101 lb 6.6 oz (46 kg)] 101 lb 6.6 oz (46 kg) (06/24 0351) Last BM Date: 09/04/17  Weight change: Filed Weights   09/03/17 0500 09/05/17 0431 09/06/17 0351  Weight: 100 lb 14.4 oz (45.8 kg) 101 lb 6.6 oz (46 kg) 101 lb 6.6 oz (46 kg)    Intake/Output:   Intake/Output Summary (Last 24 hours) at 09/06/2017 0704 Last data filed at 09/06/2017 0215 Gross per 24 hour  Intake 765.13 ml  Output 850 ml  Net -84.87 ml      Physical Exam    General: Thin, elderly male. No resp difficulty. Multiple tattoos HEENT: Normal anicteric Neck: Supple. JVP 5-6. Carotids 2+ bilat; no bruits. No thyromegaly or nodule noted. Cor: PMI nondisplaced. RRR, No M/G/R noted Lungs: fine crackles in bases R>L Abdomen: Soft, non-tender, non-distended, no HSM. No bruits or masses. +BS  Extremities: No cyanosis, clubbing, or rash. R and LLE no edema. cachetic Neuro: alert & oriented x 3, cranial nerves grossly intact. moves all 4  extremities w/o difficulty. Affect pleasant    Telemetry   Sinus brady 50s. Personally reviewed.   Labs    CBC Recent Labs    09/05/17 0440 09/06/17 0443  WBC 13.0* 14.9*  NEUTROABS 10.1* 11.5*  HGB 11.1* 10.8*  HCT 34.1* 32.6*  MCV 91.9 91.6  PLT 262 259   Basic Metabolic Panel Recent Labs    78/29/56 0440 09/06/17 0443  NA 135 133*  K 3.0* 3.1*  CL 84* 83*  CO2 38* 39*  GLUCOSE 132* 106*  BUN 73* 81*  CREATININE 2.84* 2.92*  CALCIUM 9.3 9.3   Liver Function Tests Recent Labs    09/05/17 0440 09/06/17 0443  AST 36 35  ALT 157* 124*  ALKPHOS 103 106  BILITOT 1.3* 1.1  PROT 6.2* 6.1*  ALBUMIN 3.1* 3.1*   No results for input(s): LIPASE, AMYLASE in the last 72 hours. Cardiac Enzymes No results for input(s): CKTOTAL, CKMB, CKMBINDEX, TROPONINI in the last 72 hours.  BNP: BNP (last 3 results) Recent Labs    08/27/17 2239 08/31/17 0313  BNP 3,310.3* 1,505.7*    ProBNP (last 3 results) No results for input(s): PROBNP in the last 8760 hours.   D-Dimer No results for input(s): DDIMER in the last 72 hours. Hemoglobin A1C No results for input(s): HGBA1C in the last 72 hours. Fasting Lipid Panel No results for  input(s): CHOL, HDL, LDLCALC, TRIG, CHOLHDL, LDLDIRECT in the last 72 hours. Thyroid Function Tests No results for input(s): TSH, T4TOTAL, T3FREE, THYROIDAB in the last 72 hours.  Invalid input(s): FREET3  Other results:   Imaging    No results found.   Medications:     Scheduled Medications: . amiodarone  400 mg Oral BID  . apixaban  2.5 mg Oral BID  . atorvastatin  40 mg Oral q1800  . carvedilol  3.125 mg Oral BID WC  . cefdinir  300 mg Oral Daily  . Chlorhexidine Gluconate Cloth  6 each Topical Daily  . insulin aspart  0-5 Units Subcutaneous QHS  . insulin aspart  0-9 Units Subcutaneous TID WC  . nicotine  21 mg Transdermal Daily  . pantoprazole  40 mg Oral Daily  . sodium chloride flush  10-40 mL Intracatheter Q12H     Infusions: . norepinephrine (LEVOPHED) Adult infusion Stopped (08/31/17 1459)    PRN Medications: acetaminophen, colchicine, dextromethorphan-guaiFENesin, ipratropium, levalbuterol, nitroGLYCERIN, ondansetron (ZOFRAN) IV, sodium chloride flush   Assessment/Plan   1. Acute systolic CHF/cardiogenic shock: Echo with EF 20-25%, moderate RV systolic dysfunction.  We do not know his baseline EF, no echo in system.  He has history of CABG, so ischemic cardiomyopathy is possible.  No chest pain.  TnI was mildly elevated at 0.09 but likely demand ischema with volume overload/hypotension/tachycardia.  Tachycardia-mediated cardiomyopathy is certainly possible, he was admitted in atypical flutter with RVR, unsure of how long he had been in it.  He was cardioverted on 6/17 but was back in atrial flutter with RVR 6/18.  He was hypotensive post-TEE 6/17, required intubation and phenylephrine.  He is now extubated and off phenylephrine.  Milrinone started with low co-ox on 6/18, decreased to 0.125 with elevated HR in atrial flutter.  Milrinone was stopped on 6/20.  He diuresed well on IV Lasix and now torsemide.  He has been in and out of atrial flutter over the last few days, with more amiodarone he has been mostly NSR recently.  He tolerates NSR much better.   - Diuretics stopped 6/22. Co-ox 50% in NSR today with CVP 5. Recheck coox.  - No ACEI/ARB/ARNI/digoxin/spironolactone with AKI.  - Co-ox marginal. Will stop carvedilo 3.125 mg bid.  - Cannot rule out progression of CAD with ischemic CMP though doubt ACS this admission.  Creatinine too high for cath though needs eventually if creatinine comes down.   - Would not be candidate for advanced therapies currently with CKD.  2. Atrial flutter: With RVR.  Atypical flutter.  DCCV 6/17 but did not hold, back in flutter with RVR 6/18.  He was admitted with aflutter/RVR, suspicion for tachy-mediated CMP.  He has been in and out of flutter on amiodarone, with more  amiodarone he is now mostly in NSR.  Needs NSR, much better tolerated.  - Remains in NSR on amio 400 bid - If more flutter, can add ranolazine.   - Now on Eliquis. No bleeding.  - EP has seen. Not good candidate for ablation per Dr Elberta Fortisamnitz with atypical flutter and severely dilated atria.  3. AKI: On suspected CKD stage 3: Creatinine 1.6 in 2018.  Had small kidneys suggestive of medical renal disease on last US.  Suspect AKI due to hypotension/low output/cardiorenal syndrome but not sure what his baseline is. Creatinine has trended down from peak (3.17).  - Creatinine slightly higher again today. CVP 5 4. Elevated LFTs: Possible shock liver in setting of hypotension/low output.  LFTs continue to trend down. - Statin restarted.   - Will have to use amiodarone but will monitor LFTs closely.  5. CAD: s/p CABG 2000: No cath since that time.  See discussion above, cannot rule out ischemic cardiomyopathy but doubt ACS => TnI 0.06 with no trend.  - He is on atorvastatin and BB  - No ASA with apixaban use.  6. COPD exacerbation with LUL PNA:  He has completed azithromycin and and was transitioned to ceftriaxone alone. Now on cefdinir, complete 1 week of ceftriaxone => cefdinir.  - WBC trending up 14.9. Afebrile. SBP 89-100s, O2 sats ~90% on RA.  - Denies fever, chills, cough - Check CXR today.  7. Hypkalemia - K 3.1. Supp.   Alford Highland, NP  09/06/2017, 7:04 AM  Advanced Heart Failure Team Pager [phone] (M-F; 7a - 4p)  Please contact [facility] for night-coverage after hours (4p -7a ) and weekends on [email or web address]  Patient seen and examined with the above-signed Advanced Practice Provider and/or Housestaff. I personally reviewed laboratory data, imaging studies and relevant notes. I independently examined the patient and formulated the important aspects of the plan. I have edited the note to reflect any of my changes or salient points. I have personally discussed the plan with the patient  and/or family.  He remains tenuous. Feels ok but co-ox down to 51% off milrinone. Renal function continues to get worse despite holding diuretics. WBC climbing. No localizing sites for infection. Given multiple issues will delay discharge.   Will stop carvedilol. Pull central line. Encourage incentive spirometry. Continue to hold diuretics. Supp K. Possible d/c home tomorrow if more stable.   Arvilla Meres, MD  8:12 PM

## 2017-09-07 ENCOUNTER — Telehealth (HOSPITAL_COMMUNITY): Payer: Self-pay | Admitting: *Deleted

## 2017-09-07 DIAGNOSIS — I5022 Chronic systolic (congestive) heart failure: Secondary | ICD-10-CM

## 2017-09-07 LAB — COMPREHENSIVE METABOLIC PANEL
ALBUMIN: 3.1 g/dL — AB (ref 3.5–5.0)
ALT: 106 U/L — ABNORMAL HIGH (ref 0–44)
AST: 39 U/L (ref 15–41)
Alkaline Phosphatase: 103 U/L (ref 38–126)
Anion gap: 13 (ref 5–15)
BUN: 73 mg/dL — AB (ref 8–23)
CHLORIDE: 84 mmol/L — AB (ref 98–111)
CO2: 37 mmol/L — ABNORMAL HIGH (ref 22–32)
Calcium: 9.1 mg/dL (ref 8.9–10.3)
Creatinine, Ser: 2.88 mg/dL — ABNORMAL HIGH (ref 0.61–1.24)
GFR calc Af Amer: 24 mL/min — ABNORMAL LOW (ref >60)
GFR calc non Af Amer: 20 mL/min — ABNORMAL LOW (ref >60)
GLUCOSE: 102 mg/dL — AB (ref 70–99)
Potassium: 3.7 mmol/L (ref 3.5–5.1)
Sodium: 134 mmol/L — ABNORMAL LOW (ref 135–145)
Total Bilirubin: 1.5 mg/dL — ABNORMAL HIGH (ref 0.3–1.2)
Total Protein: 5.9 g/dL — ABNORMAL LOW (ref 6.5–8.1)

## 2017-09-07 LAB — CBC
HEMATOCRIT: 33.9 % — AB (ref 39.0–52.0)
HEMOGLOBIN: 11 g/dL — AB (ref 13.0–17.0)
MCH: 30 pg (ref 26.0–34.0)
MCHC: 32.4 g/dL (ref 30.0–36.0)
MCV: 92.4 fL (ref 78.0–100.0)
PLATELETS: 269 10*3/uL (ref 150–400)
RBC: 3.67 MIL/uL — ABNORMAL LOW (ref 4.22–5.81)
RDW: 14.9 % (ref 11.5–15.5)
WBC: 13.1 10*3/uL — AB (ref 4.0–10.5)

## 2017-09-07 LAB — GLUCOSE, CAPILLARY
GLUCOSE-CAPILLARY: 111 mg/dL — AB (ref 70–99)
Glucose-Capillary: 117 mg/dL — ABNORMAL HIGH (ref 70–99)

## 2017-09-07 MED ORDER — AMIODARONE HCL 400 MG PO TABS: 400.0000 mg | ORAL_TABLET | Freq: Two times a day (BID) | ORAL | 0 refills | Status: DC

## 2017-09-07 MED ORDER — APIXABAN 2.5 MG PO TABS: 2.5000 mg | ORAL_TABLET | Freq: Two times a day (BID) | ORAL | 6 refills | Status: DC

## 2017-09-07 MED ORDER — POTASSIUM CHLORIDE ER 10 MEQ PO TBCR: 20.0000 meq | EXTENDED_RELEASE_TABLET | Freq: Every day | ORAL | 6 refills | Status: DC

## 2017-09-07 MED ORDER — AMIODARONE HCL 200 MG PO TABS: 200.0000 mg | ORAL_TABLET | Freq: Two times a day (BID) | ORAL | 3 refills | Status: DC

## 2017-09-07 MED ORDER — NICOTINE 21 MG/24HR TD PT24: 21.0000 mg | MEDICATED_PATCH | Freq: Every day | TRANSDERMAL | 0 refills | Status: DC

## 2017-09-07 MED ORDER — FUROSEMIDE 80 MG PO TABS: 80.0000 mg | ORAL_TABLET | Freq: Every day | ORAL | 11 refills | Status: DC

## 2017-09-07 NOTE — Progress Notes (Addendum)
Patient ID: Derrick Marsh, male   DOB: [birth year], 72 y.o.   MRN: [id number]     Advanced Heart Failure Rounding Note  PCP-Cardiologist: No primary care provider on file.   Subjective:    Denies CP, SOB, dizziness. Ambulated in hallways with no problem, but required 3L O2. Several desat alarms this am mid 80s% with good pleth.   Lasix held again yesterday. Standing weight pending.  Maintaining NSR on PO amiodarone.   Creatinine 2.62 => 2.75 => 2.84 => 2.92 => 2.88.    CXR with LUL PNA.  He is on cefdinir now for this, ?aspiration. He completed a course of azithromycin already.  Remains afebrile. No Chills or cough. No dysuria. No arthralgias. CBC pending this am.   Objective:   Weight Range: 106 lb 4.2 oz (48.2 kg) Body mass index is 19.44 kg/m.   Vital Signs:   Temp:  [97.4 F (36.3 C)-98.1 F (36.7 C)] 98.1 F (36.7 C) (06/25 0730) Pulse Rate:  [47-58] 56 (06/25 0730) Resp:  [13-20] 20 (06/25 0730) BP: (98-120)/(43-64) 107/58 (06/25 0730) SpO2:  [93 %-100 %] 98 % (06/25 0730) Weight:  [106 lb 4.2 oz (48.2 kg)] 106 lb 4.2 oz (48.2 kg) (06/25 0408) Last BM Date: 09/06/17  Weight change: Filed Weights   09/05/17 0431 09/06/17 0351 09/07/17 0408  Weight: 101 lb 6.6 oz (46 kg) 101 lb 6.6 oz (46 kg) 106 lb 4.2 oz (48.2 kg)    Intake/Output:   Intake/Output Summary (Last 24 hours) at 09/07/2017 0751 Last data filed at 09/06/2017 2135 Gross per 24 hour  Intake 770 ml  Output 925 ml  Net -155 ml      Physical Exam    General: Thin, elderly male. No resp difficulty. Multiple tattoos. Cachetic HEENT: Normal Neck: Supple. JVP 5-6. Carotids 2+ bilat; no bruits. No thyromegaly or nodule noted. Cor: PMI nondisplaced. RRR, No M/G/R noted Lungs: fine crackles in bases. Abdomen: Soft, non-tender, non-distended, no HSM. No bruits or masses. +BS  Extremities: No cyanosis, clubbing, or rash. R and LLE no edema.  Neuro: Alert & orientedx3, cranial nerves grossly intact.  moves all 4 extremities w/o difficulty. Affect pleasant   Telemetry   Sinus brady 50s with occasional PACs. Personally reviewed.   Labs    CBC Recent Labs    09/05/17 0440 09/06/17 0443  WBC 13.0* 14.9*  NEUTROABS 10.1* 11.5*  HGB 11.1* 10.8*  HCT 34.1* 32.6*  MCV 91.9 91.6  PLT 262 259   Basic Metabolic Panel Recent Labs    40/98/11 0443 09/07/17 0432  NA 133* 134*  K 3.1* 3.7  CL 83* 84*  CO2 39* 37*  GLUCOSE 106* 102*  BUN 81* 73*  CREATININE 2.92* 2.88*  CALCIUM 9.3 9.1   Liver Function Tests Recent Labs    09/06/17 0443 09/07/17 0432  AST 35 39  ALT 124* 106*  ALKPHOS 106 103  BILITOT 1.1 1.5*  PROT 6.1* 5.9*  ALBUMIN 3.1* 3.1*   No results for input(s): LIPASE, AMYLASE in the last 72 hours. Cardiac Enzymes No results for input(s): CKTOTAL, CKMB, CKMBINDEX, TROPONINI in the last 72 hours.  BNP: BNP (last 3 results) Recent Labs    08/27/17 2239 08/31/17 0313  BNP 3,310.3* 1,505.7*    ProBNP (last 3 results) No results for input(s): PROBNP in the last 8760 hours.   D-Dimer No results for input(s): DDIMER in the last 72 hours. Hemoglobin A1C No results for input(s): HGBA1C in the last 72 hours. Fasting  Lipid Panel No results for input(s): CHOL, HDL, LDLCALC, TRIG, CHOLHDL, LDLDIRECT in the last 72 hours. Thyroid Function Tests No results for input(s): TSH, T4TOTAL, T3FREE, THYROIDAB in the last 72 hours.  Invalid input(s): FREET3  Other results:   Imaging    Dg Chest 2 View  Result Date: 09/06/2017 CLINICAL DATA:  Leukocytosis. EXAM: CHEST - 2 VIEW COMPARISON:  09/01/2017 FINDINGS: A right jugular catheter terminates over the lower SVC. Sequelae of prior CABG are again identified. The cardiac silhouette remains mildly enlarged. The lungs remain hyperinflated with peribronchial thickening cuffing and diffuse interstitial accentuation which are similar to the prior study. Mild patchy and nodular opacity in the left greater than right  upper lobes has mildly improved. There may be tiny bilateral pleural effusions. No pneumothorax is identified. A chronic lower thoracic compression fracture is noted, also present in 2011. IMPRESSION: 1. Slightly decreased patchy opacity in the upper lobes suggesting improving pneumonia. 2. Persistent interstitial prominence which may reflect both COPD as well as superimposed mild edema. Possible trace residual pleural effusions. Electronically Signed   By: Sebastian AcheAllen  Grady M.D.   On: 09/06/2017 09:48     Medications:     Scheduled Medications: . amiodarone  400 mg Oral BID  . apixaban  2.5 mg Oral BID  . atorvastatin  40 mg Oral q1800  . cefdinir  300 mg Oral Daily  . Chlorhexidine Gluconate Cloth  6 each Topical Daily  . insulin aspart  0-5 Units Subcutaneous QHS  . insulin aspart  0-9 Units Subcutaneous TID WC  . nicotine  21 mg Transdermal Daily  . pantoprazole  40 mg Oral Daily  . sodium chloride flush  10-40 mL Intracatheter Q12H    Infusions:   PRN Medications: acetaminophen, colchicine, dextromethorphan-guaiFENesin, ipratropium, levalbuterol, nitroGLYCERIN, ondansetron (ZOFRAN) IV, sodium chloride flush   Assessment/Plan   1. Acute systolic CHF/cardiogenic shock: Echo with EF 20-25%, moderate RV systolic dysfunction.  We do not know his baseline EF, no echo in system.  He has history of CABG, so ischemic cardiomyopathy is possible.  No chest pain.  TnI was mildly elevated at 0.09 but likely demand ischema with volume overload/hypotension/tachycardia.  Tachycardia-mediated cardiomyopathy is certainly possible, he was admitted in atypical flutter with RVR, unsure of how long he had been in it.  He was cardioverted on 6/17 but was back in atrial flutter with RVR 6/18.  He was hypotensive post-TEE 6/17, required intubation and phenylephrine.  He is now extubated and off phenylephrine.  Milrinone started with low co-ox on 6/18, decreased to 0.125 with elevated HR in atrial flutter.   Milrinone was stopped on 6/20.  With more amiodarone he has been mostly NSR recently.  He tolerates NSR much better.   - Diuretics stopped 6/22. Co-ox 50%-> 59% yesterday with CVP 5.  - No ACEI/ARB/ARNI/digoxin/spironolactone with AKI.  - Co-ox marginal. BB DC'd - Cannot rule out progression of CAD with ischemic CMP though doubt ACS this admission.  Creatinine too high for cath though needs eventually if creatinine comes down.   - Would not be candidate for advanced therapies currently with CKD.  2. Atrial flutter: With RVR.  Atypical flutter.  DCCV 6/17 but did not hold, back in flutter with RVR 6/18.  He was admitted with aflutter/RVR, suspicion for tachy-mediated CMP.  He has been in and out of flutter on amiodarone, with more amiodarone he is now mostly in NSR.  Needs NSR, much better tolerated.  - Maintaining NSR on amio 400 bid -  If more flutter, can add ranolazine.   - Now on Eliquis. No bleeding.  - EP has seen. Not good candidate for ablation per Dr Elberta Fortis with atypical flutter and severely dilated atria.  3. AKI: On suspected CKD stage 3: Creatinine 1.6 in 2018.  Had small kidneys suggestive of medical renal disease on last [place].  Suspect AKI due to hypotension/low output/cardiorenal syndrome but not sure what his baseline is. Creatinine has trended down from peak (3.17).  - Creatinine trending down 2.88 this am.  4. Elevated LFTs: Possible shock liver in setting of hypotension/low output.  LFTs continue to trend down. - Statin restarted.   - Will have to use amiodarone but will monitor LFTs closely. No change.  5. CAD: s/p CABG 2000: No cath since that time.  See discussion above, cannot rule out ischemic cardiomyopathy but doubt ACS => TnI 0.06 with no trend.  - He is on atorvastatin. - No ASA with apixaban use.  - No s/s ischemia 6. COPD exacerbation with LUL PNA:  He has completed azithromycin and and was transitioned to ceftriaxone alone. Now on cefdinir, complete 1 week of  ceftriaxone => cefdinir.  - WBC 14.9 yesterday. CBC pending this am. Remains afebrile. Central line removed.  - Denies fever, chills, cough - CXR 6/24: 1. Slightly decreased patchy opacity in the upper lobes suggesting improving pneumonia. 2. Persistent interstitial prominence which may reflect both COPD as well as superimposed mild edema. Possible trace residual pleural effusions.  - Still requiring O2. O2 dropped to 89% on 2 L while walking with cardiac rehab yesterday.  - Continue IS.  7. Hypkalemia - K 3.7. Resolved.   Will have RN check to see if he requires O2 while walking this morning. Standing weight pending. CBC pending.  Likely can be discharged today pending the above. HF follow up is arranged.   Alford Highland, NP  09/07/2017, 7:51 AM  Advanced Heart Failure Team Pager [phone] (M-F; 7a - 4p)  Please contact [facility] for night-coverage after hours (4p -7a ) and weekends on [email or web address]  Patient seen and examined with the above-signed Advanced Practice Provider and/or Housestaff. I personally reviewed laboratory data, imaging studies and relevant notes. I independently examined the patient and formulated the important aspects of the plan. I have edited the note to reflect any of my changes or salient points. I have personally discussed the plan with the patient and/or family.  He is improved today.  Stable for d/c home. Agree with checking for need for home O2. Will need to complete abx course. Wuld start lasix 80 po daily for home diuretic regimen. Will f/u in HF Clinic.   Arvilla Meres, MD  9:52 AM

## 2017-09-07 NOTE — Progress Notes (Signed)
Discussed HF booklet, low sodium, daily wts (pt sts he will buy a scale today or tomorrow), walking, and CRPII. Voiced understanding but he is overwhelmed by everything. He has little support at home, does st that some friends can check on him. He also says his house is "like a Chartered loss adjusterhoarder house". He might need a RW, sts he needs to check. Will discuss with CM. [phone] Ethelda ChickKristan Akio Hudnall CES, ACSM 1:38 PM 09/07/2017

## 2017-09-07 NOTE — Progress Notes (Signed)
Pt's discharge orders in place. RN reviewed with pt his discharge packet and included teach back about pt's new medications and doctor's appointments next week. RN removed pt's IV and took pt off of telemetry.

## 2017-09-07 NOTE — Progress Notes (Signed)
SATURATION QUALIFICATIONS: (This note is used to comply with regulatory documentation for home oxygen)  Patient Saturations on Room Air at Rest = 93 %  Patient Saturations on Room Air while Ambulating = 86%  Patient Saturations on 2 Liters of oxygen while Ambulating = 92%  Please briefly explain why patient needs home oxygen: patients saturations dropped below 88% while ambulating and were not able to come up with deep breathing alone, 2L O2 had to be applied to get O2 sats back to 92%.

## 2017-09-07 NOTE — Telephone Encounter (Signed)
Patient scheduled for post hospital labs next week July 2nd @ 11:00 AM per Morrie SheldonAshley.  bmet and cbc ordered.

## 2017-09-07 NOTE — Care Management Note (Addendum)
Case Management Note  Patient Details  Name: Derrick Marsh MRN: [id number] Date of Birth: 15-Apr-1945  Subjective/Objective:  From home, for dc today with home oxygen  And [facility]  Soc will begin 24-48 hrs post dc.    Patient also has his eliquis 30 day card and co pay of 8.50.               Action/Plan: DC home when oxygen received in room.  Expected Discharge Date:  09/07/17               Expected Discharge Plan:  Home w Home Health Services  In-House Referral:  Clinical Social Work  Discharge planning Services  CM Consult, Medication Assistance  Post Acute Care Choice:  Home Health Choice offered to:  Patient  DME Arranged:  Oxygen DME Agency:  [facility]  [facility]  [facility]  Status of Service:  Completed, signed off  If discussed at Long Length of Stay Meetings, dates discussed:    Additional Comments:  Leone Havenaylor, Shaquira Moroz Clinton, RN 09/07/2017, 1:38 PM

## 2017-09-10 ENCOUNTER — Telehealth (HOSPITAL_COMMUNITY): Payer: Self-pay

## 2017-09-10 NOTE — Telephone Encounter (Signed)
Pt insurance is active and benefits verified through [facility] Co-pay $20.00, DED $0.00/$0.00 met, out of pocket $4,400.00/$4,400.00 met, co-insurance 20%. No pre-authorization required. Passport, 09/10/17 @ 10:05AM, XKG#[id number]  Will contact patient to see if he is interested in the Cardiac Rehab Program. If interested, patient will need to complete follow up appt. Once completed, patient will be contacted for scheduling upon review by the RN Navigator.

## 2017-09-14 ENCOUNTER — Ambulatory Visit (HOSPITAL_COMMUNITY)
Admission: RE | Admit: 2017-09-14 | Discharge: 2017-09-14 | Disposition: A | Discharge status: Home / Self Care | Payer: Medicare Other | Source: Ambulatory Visit | Attending: Internal Medicine | Admitting: Internal Medicine

## 2017-09-14 DIAGNOSIS — I5022 Chronic systolic (congestive) heart failure: Secondary | ICD-10-CM | POA: Diagnosis not present

## 2017-09-14 LAB — CBC
HEMATOCRIT: 36.7 % — AB (ref 39.0–52.0)
Hemoglobin: 11.5 g/dL — ABNORMAL LOW (ref 13.0–17.0)
MCH: 29.9 pg (ref 26.0–34.0)
MCHC: 31.3 g/dL (ref 30.0–36.0)
MCV: 95.6 fL (ref 78.0–100.0)
PLATELETS: 268 10*3/uL (ref 150–400)
RBC: 3.84 MIL/uL — AB (ref 4.22–5.81)
RDW: 15.5 % (ref 11.5–15.5)
WBC: 14.4 10*3/uL — ABNORMAL HIGH (ref 4.0–10.5)

## 2017-09-14 LAB — BASIC METABOLIC PANEL
ANION GAP: 8 (ref 5–15)
BUN: 25 mg/dL — ABNORMAL HIGH (ref 8–23)
CALCIUM: 9.5 mg/dL (ref 8.9–10.3)
CO2: 34 mmol/L — ABNORMAL HIGH (ref 22–32)
Chloride: 94 mmol/L — ABNORMAL LOW (ref 98–111)
Creatinine, Ser: 2.46 mg/dL — ABNORMAL HIGH (ref 0.61–1.24)
GFR, EST AFRICAN AMERICAN: 29 mL/min — AB (ref >60)
GFR, EST NON AFRICAN AMERICAN: 25 mL/min — AB (ref >60)
GLUCOSE: 96 mg/dL (ref 70–99)
POTASSIUM: 5.1 mmol/L (ref 3.5–5.1)
Sodium: 136 mmol/L (ref 135–145)

## 2017-09-20 ENCOUNTER — Telehealth (HOSPITAL_COMMUNITY): Payer: Self-pay | Admitting: Cardiology

## 2017-09-20 NOTE — Telephone Encounter (Signed)
-----   Message from Alford HighlandAshley M Smith, NP sent at 09/14/2017 12:01 PM EDT ----- Please call and have him stop potassium supplement. Creatinine is improving. WBC is trending back up. If he has any s/s infection, he needs to follow up with his PCP. Thanks

## 2017-09-20 NOTE — Telephone Encounter (Signed)
Notes recorded by Theresia BoughJeffries, Chantel M, CMA on 09/20/2017 at 3:50 PM EDT Patient aware.  ------  Notes recorded by Theresia BoughJeffries, Chantel M, CMA on 09/17/2017 at 3:34 PM EDT Left message for patient to call back. [phone] (M) ------  Notes recorded by Theresia BoughJeffries, Chantel M, CMA on 09/14/2017 at 3:39 PM EDT Left message for patient to call back.[phone] (M)  ------  Notes recorded by Alford HighlandSmith, Ashley M, NP on 09/14/2017 at 12:01 PM EDT Please call and have him stop potassium supplement. Creatinine is improving. WBC is trending back up. If he has any s/s infection, he needs to follow up with his PCP. Thanks

## 2017-09-22 ENCOUNTER — Telehealth (HOSPITAL_COMMUNITY): Payer: Self-pay

## 2017-09-22 NOTE — Telephone Encounter (Signed)
Called patient to see if he is interested in the Cardiac Rehab Program. Patient stated not at time moment, currently exercising.   Closed referral

## 2017-10-04 NOTE — Progress Notes (Signed)
Advanced Heart Failure Clinic Note    PCP: Renford Dills, MD PCP-Cardiologist: No primary care provider on file.  HF cardiologist: Dr Shirlee Latch  HPI: Derrick Marsh is a 72 y.o. male with a history of hypertension, hyperlipidemia, COPD, gout, CAD, CABGin 2000, PAD, tobacco abuse, and PVCs.  Admitted 6/14-6/25/19 with afib/flutter and acute systolic HF. Echo showed EF 20-25% with mod RV dysfunction. He was diuresed with IV lasix. He had AKI with creatinine peak of 3.17. Nephrology consulted- he is a poor HD candidate. Creatinine improved with holding diuresis. He required milrinone for low output. Underwent successful TEE/DCCV, but procedure complicated by hypotension requiring pressors and respiratory distress requiring intubation. He converted back into atrial flutter the following day, but then converted to NSR with amiodarone. EP did not think he was a good ablation candidate. Transitioned to PO amio and Eliquis prior to discharge. He was also treated for PNA. He was discharged with home O2, HH PT, and referral to cardiac rehab. DC weight: 106 lbs  He returns today for post hospital follow up. Overall doing well. He is now wearing oxygen just PRN. [facility] has been following him at home, but last day was yesterday. He has been called by cardiac rehab, but he is not interested in going. He is SOB after walking for 7-8 minutes around the house. Denies orthopnea, PND, or edema. Denies CP, dizziness, or palpitations. He has a productive cough in the morning with clear sputum. No fever or chills. Appetite and energy level are okay. He saw nephrology and will be following with them every 3 months, but he is not interested in ever being on dialysis. Taking all medications, but he has been taking metoprolol 12.5 mg BID since discharge (was [facility]). Limits fluid and salt intake. Weights are always 100 lbs at home.  Review of systems complete and found to be negative unless listed in HPI.   Past Medical  History:  Diagnosis Date  . Allergic rhinitis   . Atherosclerosis of native arteries of the extremities with intermittent claudication   . 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 HTN (hypertension)   . Hypercholesteremia   . PVC's (premature ventricular contractions)   . Tobacco abuse     Current Outpatient Medications  Medication Sig Dispense Refill  . amiodarone (PACERONE) 200 MG tablet Take 1 tablet (200 mg total) by mouth daily. 30 tablet 11  . apixaban (ELIQUIS) 2.5 MG TABS tablet Take 1 tablet (2.5 mg total) by mouth 2 (two) times daily. 60 tablet 6  . atorvastatin (LIPITOR) 40 MG tablet TAKE 1 TABLET EVERY DAY 90 tablet 3  . colchicine 0.6 MG tablet Take 0.6 mg by mouth daily as needed (for gout flares).     . furosemide (LASIX) 80 MG tablet Take 1 tablet (80 mg total) by mouth daily. 30 tablet 11  . metoprolol succinate (TOPROL-XL) 25 MG 24 hr tablet Take 0.5 tablets (12.5 mg total) by mouth daily. Take with or immediately following a meal. 90 tablet 3   No current facility-administered medications for this encounter.     Allergies  Allergen Reactions  . Aleve [Naproxen] Other (See Comments)    Was told by a MD to not take this; conflicted with his other meds      Social History   Socioeconomic History  . Marital status: Single    Spouse name: Not on  file  . Number of children: Not on file  . Years of education: Not on file  . Highest education level: Not on file  Occupational History  . Not on file  Social Needs  . Financial resource strain: Not on file  . Food insecurity:    Worry: Not on file    Inability: Not on file  . Transportation needs:    Medical: Not on file    Non-medical: Not on file  Tobacco Use  . Smoking status: Former Smoker    Packs/day: 0.50    Types: Cigars    Last attempt to quit: 07/14/2017    Years since quitting: 0.2  . Smokeless tobacco: Never Used    Substance and Sexual Activity  . Alcohol use: No  . Drug use: No  . Sexual activity: Not on file  Lifestyle  . Physical activity:    Days per week: Not on file    Minutes per session: Not on file  . Stress: Not on file  Relationships  . Social connections:    Talks on phone: Not on file    Gets together: Not on file    Attends religious service: Not on file    Active member of club or organization: Not on file    Attends meetings of clubs or organizations: Not on file    Relationship status: Not on file  . Intimate partner violence:    Fear of current or ex partner: Not on file    Emotionally abused: Not on file    Physically abused: Not on file    Forced sexual activity: Not on file  Other Topics Concern  . Not on file  Social History Narrative  . Not on file      Family History  Problem Relation Age of Onset  . Heart disease Father   . Emphysema Father   . CVA Father   . Heart failure Mother     Vitals:   10/05/17 0953  BP: 128/76  Pulse: (!) 51  SpO2: (!) 87%  Weight: 103 lb 6 oz (46.9 kg)   Wt Readings from Last 3 Encounters:  10/05/17 103 lb 6 oz (46.9 kg)  09/07/17 101 lb 3.1 oz (45.9 kg)  01/06/17 111 lb (50.3 kg)    PHYSICAL EXAM: General:  Thin. No respiratory difficulty HEENT: normal Neck: supple. JVP 6-7. Carotids 2+ bilat; no bruits. No lymphadenopathy or thyromegaly appreciated. Cor: PMI nondisplaced. Regular rate & rhythm. No rubs, gallops or murmurs. Lungs: clear Abdomen: soft, nontender, nondistended. No hepatosplenomegaly. No bruits or masses. Good bowel sounds. Extremities: no cyanosis, clubbing, rash, edema Neuro: alert & oriented x 3, cranial nerves grossly intact. moves all 4 extremities w/o difficulty. Affect pleasant.  ECG: Sinus brady 46 bpm with IVCD (QRS 128 ms). Personally reviewed.   ASSESSMENT & PLAN:  1. Chronic systolic HF. Unknown etiology. Could be tachy-induced r/t atypical aflutter with RVR. He also has hx of CABG,  but unable to do LHC during recent admission due to CKD. Troponin 0.09 and he had no CP. Required milrinone during admission for low output/cardiogenic shock.  - Echo 08/28/17: EF 20-25%, moderate RV systolic dysfunction.  - TEE 08/30/17: EF 15% - NYHA III - Volume status stable.  - Continue lasix 80 mg daily - No ACEI/ARB/ARNI/digoxin/spironolactone with CKD. Check BMET today.  - Decrease Toprol XL to 12.5 mg daily. (previously on lopressor, but will switch him to Toprol XL with systolic HF) - Cannot rule out progression of  CAD with ischemic CMP though doubt ACS this admission. Creatinine has been too high for cath though needs eventually if creatinine comes down.  - Would not be candidate for advanced therapies currently with CKD.  - Repeat echo in 3-4 months. - Encouraged him to go to cardiac rehab 2. Atrial flutter: With RVR. Atypical flutter. DCCV 6/17 but did not hold, back in flutter with RVR 6/18. He was admitted with aflutter/RVR, suspicion for tachy-mediated CMP.  - EKG shows sinus brady 46 - Decrease amio to 200 mg daily. Check LFT and TFTs today.  - Continue eliquis 2.5 mg BID - If more flutter, can add ranolazine.   - Not good candidate for ablation per Dr Elberta Fortisamnitz with atypical flutter and severely dilated atria.  3. CKD stage 3: Creatinine 1.6 in 2018. Had small kidneys suggestive of medical renal disease on last US.   - Creatinine peaked at 3.17 during admit.  - Following with Dr Signe ColtUpton now. He would be a poor HD candidate and is not interested in HD.  - Creatinine 7/2 improved 2.46. Check BMET today.    4. Elevated LFTs: Possible shock liver in setting of hypotension/low output.  - Continue statin and amio.  - Check LFTs today.  5. CAD: s/p CABG 2000: No cath since that time. See discussion above, cannot rule out ischemic cardiomyopathy but doubt ACS =>TnI 0.06 with no trend.  - Continue statin, eliquis - No s/s ischemia.  - Consider LHC if creatinine improves. See  discussion above.  6. COPD with recent PNA:   - Continue home O2. He has his oxygen in the car, but did not wear into clinic today. Encouraged him to wear regularly.  - Denies fever or chills.   Decrease amio to 200 mg daily Decrease metoprolol to 12.5 mg daily (switch to Toprol XL) CMET, TFTs today Follow up in 3 weeks with APP Echo in 3-4 months with Dr Shirlee LatchMcLean  Discussed medication changes with Dr Jeannette HowMcLean  Derrick Marsh Derrick Rowland, NP 10/05/17  Greater than 50% of the 25 minute visit was spent in counseling/coordination of care regarding disease state education, salt/fluid restriction, sliding scale diuretics, and medication compliance.

## 2017-10-05 ENCOUNTER — Ambulatory Visit (HOSPITAL_COMMUNITY)
Admit: 2017-10-05 | Discharge: 2017-10-05 | Disposition: A | Discharge status: Home / Self Care | Payer: Medicare Other | Source: Ambulatory Visit | Attending: Cardiology | Admitting: Cardiology

## 2017-10-05 ENCOUNTER — Encounter (HOSPITAL_COMMUNITY): Payer: Self-pay

## 2017-10-05 ENCOUNTER — Other Ambulatory Visit (HOSPITAL_COMMUNITY): Payer: Self-pay | Admitting: *Deleted

## 2017-10-05 VITALS — BP 128/76 | HR 51 | Wt 103.4 lb

## 2017-10-05 DIAGNOSIS — Z87891 Personal history of nicotine dependence: Secondary | ICD-10-CM | POA: Insufficient documentation

## 2017-10-05 DIAGNOSIS — Z823 Family history of stroke: Secondary | ICD-10-CM | POA: Diagnosis not present

## 2017-10-05 DIAGNOSIS — I2510 Atherosclerotic heart disease of native coronary artery without angina pectoris: Secondary | ICD-10-CM | POA: Insufficient documentation

## 2017-10-05 DIAGNOSIS — M109 Gout, unspecified: Secondary | ICD-10-CM | POA: Insufficient documentation

## 2017-10-05 DIAGNOSIS — I130 Hypertensive heart and chronic kidney disease with heart failure and stage 1 through stage 4 chronic kidney disease, or unspecified chronic kidney disease: Secondary | ICD-10-CM | POA: Insufficient documentation

## 2017-10-05 DIAGNOSIS — N183 Chronic kidney disease, stage 3 unspecified: Secondary | ICD-10-CM

## 2017-10-05 DIAGNOSIS — Z8249 Family history of ischemic heart disease and other diseases of the circulatory system: Secondary | ICD-10-CM | POA: Diagnosis not present

## 2017-10-05 DIAGNOSIS — Z8674 Personal history of sudden cardiac arrest: Secondary | ICD-10-CM | POA: Insufficient documentation

## 2017-10-05 DIAGNOSIS — Z825 Family history of asthma and other chronic lower respiratory diseases: Secondary | ICD-10-CM | POA: Diagnosis not present

## 2017-10-05 DIAGNOSIS — E7800 Pure hypercholesterolemia, unspecified: Secondary | ICD-10-CM | POA: Diagnosis not present

## 2017-10-05 DIAGNOSIS — R7989 Other specified abnormal findings of blood chemistry: Secondary | ICD-10-CM

## 2017-10-05 DIAGNOSIS — I5022 Chronic systolic (congestive) heart failure: Secondary | ICD-10-CM | POA: Diagnosis not present

## 2017-10-05 DIAGNOSIS — Z951 Presence of aortocoronary bypass graft: Secondary | ICD-10-CM | POA: Diagnosis not present

## 2017-10-05 DIAGNOSIS — I4892 Unspecified atrial flutter: Secondary | ICD-10-CM | POA: Insufficient documentation

## 2017-10-05 DIAGNOSIS — J449 Chronic obstructive pulmonary disease, unspecified: Secondary | ICD-10-CM

## 2017-10-05 DIAGNOSIS — N271 Small kidney, bilateral: Secondary | ICD-10-CM | POA: Diagnosis not present

## 2017-10-05 DIAGNOSIS — Z886 Allergy status to analgesic agent status: Secondary | ICD-10-CM | POA: Insufficient documentation

## 2017-10-05 DIAGNOSIS — Z8701 Personal history of pneumonia (recurrent): Secondary | ICD-10-CM | POA: Insufficient documentation

## 2017-10-05 DIAGNOSIS — Z79899 Other long term (current) drug therapy: Secondary | ICD-10-CM | POA: Diagnosis not present

## 2017-10-05 DIAGNOSIS — I484 Atypical atrial flutter: Secondary | ICD-10-CM

## 2017-10-05 DIAGNOSIS — Z7901 Long term (current) use of anticoagulants: Secondary | ICD-10-CM | POA: Insufficient documentation

## 2017-10-05 DIAGNOSIS — Z9981 Dependence on supplemental oxygen: Secondary | ICD-10-CM | POA: Insufficient documentation

## 2017-10-05 DIAGNOSIS — R945 Abnormal results of liver function studies: Secondary | ICD-10-CM

## 2017-10-05 LAB — COMPREHENSIVE METABOLIC PANEL
ALT: 27 U/L (ref 0–44)
AST: 29 U/L (ref 15–41)
Albumin: 3.1 g/dL — ABNORMAL LOW (ref 3.5–5.0)
Alkaline Phosphatase: 93 U/L (ref 38–126)
Anion gap: 9 (ref 5–15)
BILIRUBIN TOTAL: 0.7 mg/dL (ref 0.3–1.2)
BUN: 22 mg/dL (ref 8–23)
CHLORIDE: 99 mmol/L (ref 98–111)
CO2: 31 mmol/L (ref 22–32)
CREATININE: 2.2 mg/dL — AB (ref 0.61–1.24)
Calcium: 9.4 mg/dL (ref 8.9–10.3)
GFR, EST AFRICAN AMERICAN: 33 mL/min — AB (ref >60)
GFR, EST NON AFRICAN AMERICAN: 28 mL/min — AB (ref >60)
Glucose, Bld: 91 mg/dL (ref 70–99)
Potassium: 3.6 mmol/L (ref 3.5–5.1)
Sodium: 139 mmol/L (ref 135–145)
TOTAL PROTEIN: 6.8 g/dL (ref 6.5–8.1)

## 2017-10-05 LAB — TSH: TSH: 3.403 u[IU]/mL (ref 0.350–4.500)

## 2017-10-05 LAB — T4, FREE: FREE T4: 1.37 ng/dL (ref 0.82–1.77)

## 2017-10-05 MED ORDER — METOPROLOL SUCCINATE ER 25 MG PO TB24: 12.5000 mg | ORAL_TABLET | Freq: Every day | ORAL | 3 refills | Status: DC

## 2017-10-05 MED ORDER — AMIODARONE HCL 200 MG PO TABS: 200.0000 mg | ORAL_TABLET | Freq: Every day | ORAL | 11 refills | Status: DC

## 2017-10-05 MED ORDER — METOPROLOL TARTRATE 25 MG PO TABS: 12.5000 mg | ORAL_TABLET | Freq: Every day | ORAL | 3 refills | Status: DC

## 2017-10-05 NOTE — Patient Instructions (Signed)
STOP Metoprolol (Lopressor).  START Toprol XL 12.5 mg (1/2 tablet) once daily.  DECREASE Amiodarone to 200 mg tablet ONCE daily.  Routine lab work today. Will notify you of abnormal results, otherwise no news is good news!  Follow up with Duwaine MaxinAshley Smith NP-C in 3 weeks.  ________________________________________________________________ [place] Code: 1500  Follow up 3 months with Dr. Shirlee LatchMcLean and echocardiogram.  _________________________________________________________________ [place] [facility] Code: 1700  Take all medication as prescribed the day of your appointment. Bring all medications with you to your appointment.  Do the following things EVERYDAY: 1) Weigh yourself in the morning before breakfast. Write it down and keep it in a log. 2) Take your medicines as prescribed 3) Eat low salt foods-Limit salt (sodium) to 2000 mg per day.  4) Stay as active as you can everyday 5) Limit all fluids for the day to less than 2 liters

## 2017-10-06 LAB — T3, FREE: T3, Free: 1.9 pg/mL — ABNORMAL LOW (ref 2.0–4.4)

## 2017-10-25 NOTE — Progress Notes (Signed)
Advanced Heart Failure Clinic Note    PCP: Renford Dills, MD PCP-Cardiologist: No primary care provider on file.  HF cardiologist: Dr Shirlee Latch  HPI: Derrick Marsh is a 72 y.o. male with a history of hypertension, hyperlipidemia, COPD, gout, CAD, CABGin 2000, PAD, tobacco abuse, and PVCs.  Admitted 6/14-6/25/19 with afib/flutter and acute systolic HF. Echo showed EF 20-25% with mod RV dysfunction. He was diuresed with IV lasix. He had AKI with creatinine peak of 3.17. Nephrology consulted- he is a poor HD candidate. Creatinine improved with holding diuresis. He required milrinone for low output. Underwent successful TEE/DCCV, but procedure complicated by hypotension requiring pressors and respiratory distress requiring intubation. He converted back into atrial flutter the following day, but then converted to NSR with amiodarone. EP did not think he was a good ablation candidate. Transitioned to PO amio and Eliquis prior to discharge. He was also treated for PNA. He was discharged with home O2, HH PT, and referral to cardiac rehab. DC weight: 106 lbs  He returns today for regular follow up. Last visit was found to be bradycardic. Amio and Toprol were decreased (he had restarted Toprol on his own). Overall doing well. Denies SOB except when very humid outside. Able to walk up and down stairs with no problems. Denies edema, orthopnea, or PND. He has been wearing his oxygen less at home because he feels good, but does not monitor O2 sats. He has mild dizziness when he first wakes up, but has been chronic his entire life. Denies bleeding on Eliquis. He had 1-2 episodes of chest tightness over the last month that lasted seconds. Occurred at rest. Improved with O2. No associated symptoms. Did not feel like previous angina (had shoulder pain). Taking all medications. Trying to gain weight. Weights always 99.8 lbs at home.   Review of systems complete and found to be negative unless listed in HPI.    Past  Medical History:  Diagnosis Date  . Allergic rhinitis   . Atherosclerosis of native arteries of the extremities with intermittent claudication   . 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 HTN (hypertension)   . Hypercholesteremia   . PVC's (premature ventricular contractions)   . Tobacco abuse     Current Outpatient Medications  Medication Sig Dispense Refill  . amiodarone (PACERONE) 200 MG tablet Take 1 tablet (200 mg total) by mouth daily. 30 tablet 11  . apixaban (ELIQUIS) 2.5 MG TABS tablet Take 1 tablet (2.5 mg total) by mouth 2 (two) times daily. 60 tablet 6  . atorvastatin (LIPITOR) 40 MG tablet TAKE 1 TABLET EVERY DAY 90 tablet 3  . colchicine 0.6 MG tablet Take 0.6 mg by mouth daily as needed (for gout flares).     . furosemide (LASIX) 80 MG tablet Take 1 tablet (80 mg total) by mouth daily. 30 tablet 11  . metoprolol succinate (TOPROL-XL) 25 MG 24 hr tablet Take 0.5 tablets (12.5 mg total) by mouth daily. Take with or immediately following a meal. 90 tablet 3   No current facility-administered medications for this encounter.     Allergies  Allergen Reactions  . Aleve [Naproxen] Other (See Comments)    Was told by a MD to not take this; conflicted with his other meds      Social History   Socioeconomic History  . Marital status: Single    Spouse name: Not on  file  . Number of children: Not on file  . Years of education: Not on file  . Highest education level: Not on file  Occupational History  . Not on file  Social Needs  . Financial resource strain: Not on file  . Food insecurity:    Worry: Not on file    Inability: Not on file  . Transportation needs:    Medical: Not on file    Non-medical: Not on file  Tobacco Use  . Smoking status: Former Smoker    Packs/day: 0.50    Types: Cigars    Last attempt to quit: 07/14/2017    Years since quitting: 0.2  . Smokeless tobacco: Never  Used  Substance and Sexual Activity  . Alcohol use: No  . Drug use: No  . Sexual activity: Not on file  Lifestyle  . Physical activity:    Days per week: Not on file    Minutes per session: Not on file  . Stress: Not on file  Relationships  . Social connections:    Talks on phone: Not on file    Gets together: Not on file    Attends religious service: Not on file    Active member of club or organization: Not on file    Attends meetings of clubs or organizations: Not on file    Relationship status: Not on file  . Intimate partner violence:    Fear of current or ex partner: Not on file    Emotionally abused: Not on file    Physically abused: Not on file    Forced sexual activity: Not on file  Other Topics Concern  . Not on file  Social History Narrative  . Not on file      Family History  Problem Relation Age of Onset  . Heart disease Father   . Emphysema Father   . CVA Father   . Heart failure Mother     Vitals:   10/26/17 1139  BP: 132/66  Pulse: (!) 56  SpO2: 92%  Weight: 47.2 kg (104 lb)   Wt Readings from Last 3 Encounters:  10/26/17 47.2 kg (104 lb)  10/05/17 46.9 kg (103 lb 6 oz)  09/07/17 45.9 kg (101 lb 3.1 oz)    PHYSICAL EXAM: General: Thin. No resp difficulty. Walked into clinic with cane.  HEENT: Normal Neck: Supple. JVP flat. Carotids 2+ bilat; no bruits. No thyromegaly or nodule noted. Cor: PMI nondisplaced. RRR, No M/G/R noted Lungs: CTAB, normal effort. Abdomen: Soft, non-tender, non-distended, no HSM. No bruits or masses. +BS  Extremities: No cyanosis, clubbing, or rash. R and LLE no edema.  Neuro: Alert & orientedx3, cranial nerves grossly intact. moves all 4 extremities w/o difficulty. Affect pleasant   ASSESSMENT & PLAN:  1. Chronic systolic HF. Unknown etiology. Could be tachy-induced r/t atypical aflutter with RVR. He also has hx of CABG, but unable to do LHC during recent admission due to CKD. Troponin 0.09 and he had no CP.  Required milrinone during admission for low output/cardiogenic shock.  - Echo 08/28/17: EF 20-25%, moderate RV systolic dysfunction.  - TEE 08/30/17: EF 15% - NYHA improved II.  - Volume status stable on exam.  - Continue lasix 80 mg daily - No ACEI/ARB/ARNI/digoxin/spironolactone with CKD. Considered adding low dose hydral, but will hold off now for with CKD. BMET today.  - Continue Toprol XL to 12.5 mg daily.  - Cannot rule out progression of CAD with ischemic CMP though doubt ACS this  admission. Creatinine has been too high for cath though needs eventually if creatinine comes down.  - Would not be candidate for advanced therapies currently with CKD.  - Repeat echo at next appointment with Dr Shirlee LatchMcLean (10/1) - Encouraged him to go to cardiac rehab 2. Atrial flutter: With RVR. Atypical flutter. DCCV 6/17 but did not hold, back in flutter with RVR 6/18. He was admitted with aflutter/RVR, suspicion for tachy-mediated CMP.  - Regular on exam.  - Continue amio 200 mg daily. LFTs and TSH okay 09/2017 - Continue eliquis 2.5 mg BID. Denies bleeding.  - If more flutter, can add ranolazine.   - Not good candidate for ablation per Dr Elberta Fortisamnitz with atypical flutter and severely dilated atria.  3. CKD stage 3: Creatinine 1.6 in 2018. Had small kidneys suggestive of medical renal disease on last US.   - Creatinine peaked at 3.17 during admit.  - Following with Dr Signe ColtUpton now. He would be a poor HD candidate and is not interested in HD.  - Creatinine 7/23 improved to 2.2. Check BMET today.  4. Elevated LFTs: Possible shock liver in setting of hypotension/low output.   - Resolved on most recent bloodwork. 5. CAD: s/p CABG 2000: No cath since that time. See discussion above, cannot rule out ischemic cardiomyopathy but doubt ACS =>TnI 0.06 with no trend.  - Continue statin, eliquis - Had 1-2 episodes of brief midsternal chest tightness in the last month. Improved with putting on oxygen. Occurred at rest.  No associated symptoms. Does not feel similar to previous angina symptoms. He will let us know if this becomes more frequent.  - Consider LHC if creatinine improves. See discussion above.  6. COPD  - Continue home O2. He has his oxygen in the car, but did not wear into clinic today. He is wearing O2 less frequently at home.  - Checked his oxygen walking walking around clinic today. He desatted to 88% while talking and walking,but recovered quickly. Change O2 order to PRN for now. Sats > 90% at rest.   Keep Echo 10/1 with Dr Shirlee LatchMcLean BMET today  Alford HighlandAshley M Bosten Newstrom, NP 10/26/17  Greater than 50% of the 25 minute visit was spent in counseling/coordination of care regarding disease state education, salt/fluid restriction, sliding scale diuretics, and medication compliance.

## 2017-10-26 ENCOUNTER — Ambulatory Visit (HOSPITAL_COMMUNITY)
Admission: RE | Admit: 2017-10-26 | Discharge: 2017-10-26 | Disposition: A | Discharge status: Home / Self Care | Payer: Medicare Other | Source: Ambulatory Visit | Attending: Internal Medicine | Admitting: Internal Medicine

## 2017-10-26 ENCOUNTER — Encounter (HOSPITAL_COMMUNITY): Payer: Self-pay

## 2017-10-26 VITALS — BP 132/66 | HR 56 | Wt 104.0 lb

## 2017-10-26 DIAGNOSIS — N183 Chronic kidney disease, stage 3 unspecified: Secondary | ICD-10-CM

## 2017-10-26 DIAGNOSIS — Z87891 Personal history of nicotine dependence: Secondary | ICD-10-CM | POA: Insufficient documentation

## 2017-10-26 DIAGNOSIS — Z79899 Other long term (current) drug therapy: Secondary | ICD-10-CM | POA: Insufficient documentation

## 2017-10-26 DIAGNOSIS — Z7901 Long term (current) use of anticoagulants: Secondary | ICD-10-CM | POA: Diagnosis not present

## 2017-10-26 DIAGNOSIS — I493 Ventricular premature depolarization: Secondary | ICD-10-CM | POA: Diagnosis not present

## 2017-10-26 DIAGNOSIS — I4891 Unspecified atrial fibrillation: Secondary | ICD-10-CM | POA: Diagnosis not present

## 2017-10-26 DIAGNOSIS — E785 Hyperlipidemia, unspecified: Secondary | ICD-10-CM | POA: Diagnosis not present

## 2017-10-26 DIAGNOSIS — I130 Hypertensive heart and chronic kidney disease with heart failure and stage 1 through stage 4 chronic kidney disease, or unspecified chronic kidney disease: Secondary | ICD-10-CM | POA: Insufficient documentation

## 2017-10-26 DIAGNOSIS — J449 Chronic obstructive pulmonary disease, unspecified: Secondary | ICD-10-CM | POA: Insufficient documentation

## 2017-10-26 DIAGNOSIS — M109 Gout, unspecified: Secondary | ICD-10-CM | POA: Insufficient documentation

## 2017-10-26 DIAGNOSIS — E7800 Pure hypercholesterolemia, unspecified: Secondary | ICD-10-CM | POA: Diagnosis not present

## 2017-10-26 DIAGNOSIS — I4892 Unspecified atrial flutter: Secondary | ICD-10-CM | POA: Diagnosis not present

## 2017-10-26 DIAGNOSIS — Z9981 Dependence on supplemental oxygen: Secondary | ICD-10-CM | POA: Diagnosis not present

## 2017-10-26 DIAGNOSIS — I5042 Chronic combined systolic (congestive) and diastolic (congestive) heart failure: Secondary | ICD-10-CM

## 2017-10-26 DIAGNOSIS — N271 Small kidney, bilateral: Secondary | ICD-10-CM | POA: Diagnosis not present

## 2017-10-26 DIAGNOSIS — I2510 Atherosclerotic heart disease of native coronary artery without angina pectoris: Secondary | ICD-10-CM | POA: Diagnosis not present

## 2017-10-26 DIAGNOSIS — I5022 Chronic systolic (congestive) heart failure: Secondary | ICD-10-CM | POA: Diagnosis not present

## 2017-10-26 DIAGNOSIS — Z951 Presence of aortocoronary bypass graft: Secondary | ICD-10-CM | POA: Diagnosis not present

## 2017-10-26 DIAGNOSIS — I484 Atypical atrial flutter: Secondary | ICD-10-CM

## 2017-10-26 LAB — BASIC METABOLIC PANEL
Anion gap: 11 (ref 5–15)
BUN: 24 mg/dL — AB (ref 8–23)
CHLORIDE: 100 mmol/L (ref 98–111)
CO2: 28 mmol/L (ref 22–32)
Calcium: 9.5 mg/dL (ref 8.9–10.3)
Creatinine, Ser: 2.29 mg/dL — ABNORMAL HIGH (ref 0.61–1.24)
GFR calc non Af Amer: 27 mL/min — ABNORMAL LOW (ref >60)
GFR, EST AFRICAN AMERICAN: 31 mL/min — AB (ref >60)
Glucose, Bld: 112 mg/dL — ABNORMAL HIGH (ref 70–99)
Potassium: 3.7 mmol/L (ref 3.5–5.1)
Sodium: 139 mmol/L (ref 135–145)

## 2017-10-26 NOTE — Patient Instructions (Addendum)
Routine lab work today. Will notify you of abnormal results, otherwise no news is good news!  No changes to medication at this time.  Wear oxygen as needed with exertion or shortness of breath.  Follow up as scheduled with Dr. Shirlee LatchMcLean and echocardiogram. See next page for appointment details.  Take all medication as prescribed the day of your appointment. Bring all medications with you to your appointment.  Do the following things EVERYDAY: 1) Weigh yourself in the morning before breakfast. Write it down and keep it in a log. 2) Take your medicines as prescribed 3) Eat low salt foods-Limit salt (sodium) to 2000 mg per day.  4) Stay as active as you can everyday 5) Limit all fluids for the day to less than 2 liters

## 2017-12-07 ENCOUNTER — Telehealth (HOSPITAL_COMMUNITY): Payer: Self-pay | Admitting: Surgery

## 2017-12-07 NOTE — Telephone Encounter (Signed)
I called Derrick Marsh regarding his upcoming appt in [facility] with Dr. Shirlee LatchMcLean.  He is agreeable to change his appt to October 11th at 2:30pm.  He will keep his echocardiogram on October 1 at 11:00am.

## 2017-12-13 ENCOUNTER — Other Ambulatory Visit: Payer: Self-pay | Admitting: Cardiology

## 2017-12-14 ENCOUNTER — Encounter (HOSPITAL_COMMUNITY): Payer: Medicare Other | Admitting: Cardiology

## 2017-12-14 ENCOUNTER — Telehealth (HOSPITAL_COMMUNITY): Payer: Self-pay

## 2017-12-14 ENCOUNTER — Ambulatory Visit (HOSPITAL_COMMUNITY)
Admission: RE | Admit: 2017-12-14 | Discharge: 2017-12-14 | Disposition: A | Discharge status: Home / Self Care | Payer: Medicare Other | Source: Ambulatory Visit | Attending: Cardiology | Admitting: Cardiology

## 2017-12-14 DIAGNOSIS — I2510 Atherosclerotic heart disease of native coronary artery without angina pectoris: Secondary | ICD-10-CM | POA: Insufficient documentation

## 2017-12-14 DIAGNOSIS — I5022 Chronic systolic (congestive) heart failure: Secondary | ICD-10-CM | POA: Diagnosis present

## 2017-12-14 DIAGNOSIS — I340 Nonrheumatic mitral (valve) insufficiency: Secondary | ICD-10-CM | POA: Insufficient documentation

## 2017-12-14 DIAGNOSIS — Z720 Tobacco use: Secondary | ICD-10-CM | POA: Diagnosis not present

## 2017-12-14 DIAGNOSIS — I110 Hypertensive heart disease with heart failure: Secondary | ICD-10-CM | POA: Insufficient documentation

## 2017-12-14 MED ORDER — PERFLUTREN LIPID MICROSPHERE
1.0000 mL | INTRAVENOUS | Status: AC | PRN
  Administered 2017-12-14: 2 mL via INTRAVENOUS
  Filled 2017-12-14: qty 10

## 2017-12-14 NOTE — Progress Notes (Signed)
  Echocardiogram 2D Echocardiogram has been performed.  Tye Savoy 12/14/2017, 12:33 PM

## 2017-12-14 NOTE — Telephone Encounter (Signed)
EF remains low at 20-25%, pt called with results, pt has a follow up appointment with Dr.McLean on 12/24/17. Pt informed to keep his appointment pt verbalized understanding

## 2017-12-24 ENCOUNTER — Ambulatory Visit (HOSPITAL_COMMUNITY)
Admission: RE | Admit: 2017-12-24 | Discharge: 2017-12-24 | Disposition: A | Discharge status: Home / Self Care | Payer: Medicare Other | Source: Ambulatory Visit | Attending: Cardiology | Admitting: Cardiology

## 2017-12-24 VITALS — BP 120/60 | HR 55 | Wt 101.2 lb

## 2017-12-24 DIAGNOSIS — M109 Gout, unspecified: Secondary | ICD-10-CM | POA: Insufficient documentation

## 2017-12-24 DIAGNOSIS — J449 Chronic obstructive pulmonary disease, unspecified: Secondary | ICD-10-CM | POA: Diagnosis not present

## 2017-12-24 DIAGNOSIS — I252 Old myocardial infarction: Secondary | ICD-10-CM | POA: Insufficient documentation

## 2017-12-24 DIAGNOSIS — I484 Atypical atrial flutter: Secondary | ICD-10-CM | POA: Insufficient documentation

## 2017-12-24 DIAGNOSIS — Z9981 Dependence on supplemental oxygen: Secondary | ICD-10-CM | POA: Insufficient documentation

## 2017-12-24 DIAGNOSIS — I5041 Acute combined systolic (congestive) and diastolic (congestive) heart failure: Secondary | ICD-10-CM

## 2017-12-24 DIAGNOSIS — I2510 Atherosclerotic heart disease of native coronary artery without angina pectoris: Secondary | ICD-10-CM

## 2017-12-24 DIAGNOSIS — I255 Ischemic cardiomyopathy: Secondary | ICD-10-CM | POA: Diagnosis not present

## 2017-12-24 DIAGNOSIS — N183 Chronic kidney disease, stage 3 (moderate): Secondary | ICD-10-CM | POA: Diagnosis not present

## 2017-12-24 DIAGNOSIS — Z7901 Long term (current) use of anticoagulants: Secondary | ICD-10-CM | POA: Diagnosis not present

## 2017-12-24 DIAGNOSIS — E785 Hyperlipidemia, unspecified: Secondary | ICD-10-CM | POA: Insufficient documentation

## 2017-12-24 DIAGNOSIS — Z8249 Family history of ischemic heart disease and other diseases of the circulatory system: Secondary | ICD-10-CM | POA: Insufficient documentation

## 2017-12-24 DIAGNOSIS — Z79899 Other long term (current) drug therapy: Secondary | ICD-10-CM | POA: Insufficient documentation

## 2017-12-24 DIAGNOSIS — I5022 Chronic systolic (congestive) heart failure: Secondary | ICD-10-CM | POA: Diagnosis not present

## 2017-12-24 DIAGNOSIS — Z951 Presence of aortocoronary bypass graft: Secondary | ICD-10-CM | POA: Insufficient documentation

## 2017-12-24 DIAGNOSIS — I130 Hypertensive heart and chronic kidney disease with heart failure and stage 1 through stage 4 chronic kidney disease, or unspecified chronic kidney disease: Secondary | ICD-10-CM | POA: Insufficient documentation

## 2017-12-24 LAB — COMPREHENSIVE METABOLIC PANEL
ALBUMIN: 3.8 g/dL (ref 3.5–5.0)
ALK PHOS: 97 U/L (ref 38–126)
ALT: 36 U/L (ref 0–44)
AST: 43 U/L — AB (ref 15–41)
Anion gap: 10 (ref 5–15)
BUN: 30 mg/dL — AB (ref 8–23)
CO2: 29 mmol/L (ref 22–32)
CREATININE: 2.68 mg/dL — AB (ref 0.61–1.24)
Calcium: 9.7 mg/dL (ref 8.9–10.3)
Chloride: 101 mmol/L (ref 98–111)
GFR calc Af Amer: 26 mL/min — ABNORMAL LOW (ref >60)
GFR calc non Af Amer: 22 mL/min — ABNORMAL LOW (ref >60)
GLUCOSE: 98 mg/dL (ref 70–99)
Potassium: 3.5 mmol/L (ref 3.5–5.1)
SODIUM: 140 mmol/L (ref 135–145)
TOTAL PROTEIN: 7.8 g/dL (ref 6.5–8.1)
Total Bilirubin: 0.9 mg/dL (ref 0.3–1.2)

## 2017-12-24 LAB — CBC
HCT: 36.1 % — ABNORMAL LOW (ref 39.0–52.0)
HEMOGLOBIN: 11.2 g/dL — AB (ref 13.0–17.0)
MCH: 29.2 pg (ref 26.0–34.0)
MCHC: 31 g/dL (ref 30.0–36.0)
MCV: 94 fL (ref 80.0–100.0)
Platelets: 481 10*3/uL — ABNORMAL HIGH (ref 150–400)
RBC: 3.84 MIL/uL — AB (ref 4.22–5.81)
RDW: 14.7 % (ref 11.5–15.5)
WBC: 9 10*3/uL (ref 4.0–10.5)
nRBC: 0 % (ref 0.0–0.2)

## 2017-12-24 LAB — TSH: TSH: 3.499 u[IU]/mL (ref 0.350–4.500)

## 2017-12-24 MED ORDER — SPIRONOLACTONE 25 MG PO TABS: 12.5000 mg | ORAL_TABLET | Freq: Every day | ORAL | 3 refills | Status: DC

## 2017-12-24 NOTE — Patient Instructions (Signed)
Start Spironolactone 12.5mg  daily.  Routine lab work today. Will notify you of abnormal results  Follow up with Dr.McLean in 2 months.

## 2017-12-25 ENCOUNTER — Other Ambulatory Visit: Payer: Self-pay | Admitting: Cardiology

## 2017-12-26 NOTE — Progress Notes (Signed)
Advanced Heart Failure Clinic Note    PCP: Renford Dills, MD PCP-Cardiologist: No primary care provider on file.  HF cardiologist: Dr Shirlee Latch  HPI: Derrick Marsh is a 72 y.o. male with a history of hypertension, hyperlipidemia, COPD, gout, CAD, CABGin 2000, PAD, tobacco abuse, CKD stage 3, and PVCs.  Admitted 6/14-6/25/19 with atypical atrial flutter and acute systolic HF. Echo showed EF 20-25% with moderate RV dysfunction. He was diuresed with IV lasix. He had AKI on CKD stage 3 with creatinine peak of 3.17.  Creatinine improved with holding diuresis. He required milrinone for low output HF. Underwent successful TEE/DCCV, but procedure complicated by hypotension requiring pressors and respiratory distress requiring intubation. He converted back into atrial flutter the following day, but then converted to NSR with amiodarone. EP did not think he was a good ablation candidate. Transitioned to po amiodarone and Eliquis prior to discharge. He was also treated for PNA. He was discharged with home O2. DC weight: 106 lbs.    Most recent echo in 10/19 showed EF 20-25%, mild LV dilation, PASP 48 mmHg.   He returns today for followup of CHF and CAD.  Weight is down 3 lbs.   No BRBPR/melena on Eliquis.  He has home oxygen but uses it only sporadically.  We walked him in the hall today, oxygen saturation did not drop lower than 92% on room air.  He is generally doing well.  No chest pain.  No dyspnea except with fast walking.  He does fine walking at a steady pace on flat ground.  He uses a riding lawnmower to mow his grass.  Occasional lightheadedness if he stands too fast.  No palpitations.    ECG (personally reviewed): sinus brady at 45, IVCD 130 msec  Labs (7/19): TSH normal, LFTs normal, hgb 11.5 Labs (8/19): K 3.7, creatinine 2.29  Review of systems complete and found to be negative unless listed in HPI.    PMH: 1. CAD: Acute inferior MI in 2000 with CABG => LIMA-LAD, SVG-OM, SVG-RCA.  2.  PAD 3. HTN 4. Hyperlipidemia 5. PVCs 6. COPD: Quit smoking in 5/19.  7. Chronic systolic CHF: Ischemic cardiomyopathy.  - Echo (10/19): EF 20-25%, mild LV dilation, diffuse hypokinesis, PASP 48 mmHg.  8. Gout 9. Atrial flutter: Atypical in 6/19, required DCCV and amiodarone.  Seen by EP, not thought to be a good ablation candidate.    Current Outpatient Medications  Medication Sig Dispense Refill  . amiodarone (PACERONE) 200 MG tablet Take 1 tablet (200 mg total) by mouth daily. 30 tablet 11  . apixaban (ELIQUIS) 2.5 MG TABS tablet Take 1 tablet (2.5 mg total) by mouth 2 (two) times daily. 60 tablet 6  . atorvastatin (LIPITOR) 40 MG tablet TAKE 1 TABLET EVERY DAY 90 tablet 3  . furosemide (LASIX) 80 MG tablet Take 1 tablet (80 mg total) by mouth daily. 30 tablet 11  . metoprolol succinate (TOPROL-XL) 25 MG 24 hr tablet Take 0.5 tablets (12.5 mg total) by mouth daily. Take with or immediately following a meal. 90 tablet 3  . colchicine 0.6 MG tablet Take 0.6 mg by mouth daily as needed (for gout flares).     Marland Kitchen spironolactone (ALDACTONE) 25 MG tablet Take 0.5 tablets (12.5 mg total) by mouth daily. 15 tablet 3   No current facility-administered medications for this encounter.     Allergies  Allergen Reactions  . Aleve [Naproxen] Other (See Comments)    Was told by a MD to not take this;  conflicted with his other meds      Social History   Socioeconomic History  . Marital status: Single    Spouse name: Not on file  . Number of children: Not on file  . Years of education: Not on file  . Highest education level: Not on file  Occupational History  . Not on file  Social Needs  . Financial resource strain: Not on file  . Food insecurity:    Worry: Not on file    Inability: Not on file  . Transportation needs:    Medical: Not on file    Non-medical: Not on file  Tobacco Use  . Smoking status: Former Smoker    Packs/day: 0.50    Types: Cigars    Last attempt to quit:  07/14/2017    Years since quitting: 0.4  . Smokeless tobacco: Never Used  Substance and Sexual Activity  . Alcohol use: No  . Drug use: No  . Sexual activity: Not on file  Lifestyle  . Physical activity:    Days per week: Not on file    Minutes per session: Not on file  . Stress: Not on file  Relationships  . Social connections:    Talks on phone: Not on file    Gets together: Not on file    Attends religious service: Not on file    Active member of club or organization: Not on file    Attends meetings of clubs or organizations: Not on file    Relationship status: Not on file  . Intimate partner violence:    Fear of current or ex partner: Not on file    Emotionally abused: Not on file    Physically abused: Not on file    Forced sexual activity: Not on file  Other Topics Concern  . Not on file  Social History Narrative  . Not on file      Family History  Problem Relation Age of Onset  . Heart disease Father   . Emphysema Father   . CVA Father   . Heart failure Mother     Vitals:   12/24/17 1511  BP: 120/60  Pulse: (!) 55  SpO2: 95%  Weight: 45.9 kg (101 lb 3.2 oz)   Wt Readings from Last 3 Encounters:  12/24/17 45.9 kg (101 lb 3.2 oz)  10/26/17 47.2 kg (104 lb)  10/05/17 46.9 kg (103 lb 6 oz)    PHYSICAL EXAM: General: NAD, thin Neck: No JVD, no thyromegaly or thyroid nodule.  Lungs: Clear to auscultation bilaterally with normal respiratory effort. CV: Nondisplaced PMI.  Heart regular S1/S2, no S3/S4, no murmur.  No peripheral edema.  No carotid bruit.  Normal pedal pulses.  Abdomen: Soft, nontender, no hepatosplenomegaly, no distention.  Skin: Multiple tatoos Neurologic: Alert and oriented x 3.  Psych: Normal affect. Extremities: No clubbing or cyanosis.  HEENT: Normal.    ASSESSMENT & PLAN:  1. Chronic systolic HF:  Ischemic cardiomyopathy.  He also has hx of CABG, but unable to do LHC during 6/19 admission due to AKI on CKD stage 3. During that  admission, he required milrinone for low output/cardiogenic shock. Echo (6/19) showed EF 20-25%, moderate RV systolic dysfunction. Echo (10/19) showed EF 20-25%, no significant change.   Symptomatically doing well, NYHA class II.  He is not volume overloaded on exam.  - Continue lasix 80 mg daily. BMET today.  - No ACEI/ARB/ARNI/digoxin with CKD.  - I will try him on low dose spironolactone  12.5 mg daily.  Repeat BMET 10 days.  - Continue Toprol XL 12.5 mg daily, he has sinus bradycardia so will not increase.  - Cannot rule out progression of CAD with ischemic CMP though doubt ACS at 6/19 admission. Would not do coronary angiography at this point with high creatinine unless he has clear ACS.  - Would likely not be candidate for advanced therapies with his degree of CKD.  - He qualifies for ICD with persistently low EF but not CRT (do not think QRS is wide enough for him to benefit significantly). Will discuss ICD with him, will need EP referral.  2. Atrial flutter: Atypical at 6/19 admission. He had DCCV with recurrence, then started on amiodarone and since then has been in NSR.  He was seen by EP, not thought to be a good ablation candidate.  - Continue amiodarone 200 mg daily.  Check LFTs/TSH today, he will need a regular eye exam.  - Continue eliquis 2.5 mg BID. CBC today.  - If more flutter, can add ranolazine.   3. CKD stage 3: Following with Dr Signe Colt now.  - BMET today.   4. CAD: s/p CABG 2000.  No cath since that time. See discussion above, will not cath unless he has significant symptoms.  No chest pain.   - Does not want to do cardiac rehab.  - Continue statin - No ASA given stable CAD with Eliquis use.  5. COPD: He has quit smoking since 5/19.  Oxygen saturation was 92% today on room air with ambulation, has not been using oxygen.   Marca Ancona, MD 12/26/17

## 2017-12-27 ENCOUNTER — Telehealth (HOSPITAL_COMMUNITY): Payer: Self-pay

## 2017-12-27 NOTE — Telephone Encounter (Signed)
Pt called with lab results Elevated creatinine, will need to follow closely. Pt verbalized understanding

## 2018-01-05 ENCOUNTER — Other Ambulatory Visit: Payer: Self-pay | Admitting: Cardiology

## 2018-01-26 ENCOUNTER — Other Ambulatory Visit: Payer: Self-pay | Admitting: Cardiology

## 2018-03-10 ENCOUNTER — Encounter: Payer: Self-pay | Admitting: Cardiology

## 2018-03-18 ENCOUNTER — Ambulatory Visit (HOSPITAL_COMMUNITY)
Admission: RE | Admit: 2018-03-18 | Discharge: 2018-03-18 | Disposition: A | Discharge status: Home / Self Care | Payer: Medicare Other | Source: Ambulatory Visit | Attending: Cardiology | Admitting: Cardiology

## 2018-03-18 ENCOUNTER — Telehealth (HOSPITAL_COMMUNITY): Payer: Self-pay

## 2018-03-18 ENCOUNTER — Encounter (HOSPITAL_COMMUNITY): Payer: Self-pay | Admitting: Cardiology

## 2018-03-18 VITALS — BP 100/54 | HR 68 | Wt 97.8 lb

## 2018-03-18 DIAGNOSIS — I2510 Atherosclerotic heart disease of native coronary artery without angina pectoris: Secondary | ICD-10-CM | POA: Insufficient documentation

## 2018-03-18 DIAGNOSIS — J449 Chronic obstructive pulmonary disease, unspecified: Secondary | ICD-10-CM | POA: Diagnosis not present

## 2018-03-18 DIAGNOSIS — Z8249 Family history of ischemic heart disease and other diseases of the circulatory system: Secondary | ICD-10-CM | POA: Diagnosis not present

## 2018-03-18 DIAGNOSIS — M109 Gout, unspecified: Secondary | ICD-10-CM | POA: Diagnosis not present

## 2018-03-18 DIAGNOSIS — E785 Hyperlipidemia, unspecified: Secondary | ICD-10-CM | POA: Insufficient documentation

## 2018-03-18 DIAGNOSIS — I130 Hypertensive heart and chronic kidney disease with heart failure and stage 1 through stage 4 chronic kidney disease, or unspecified chronic kidney disease: Secondary | ICD-10-CM | POA: Insufficient documentation

## 2018-03-18 DIAGNOSIS — I484 Atypical atrial flutter: Secondary | ICD-10-CM | POA: Insufficient documentation

## 2018-03-18 DIAGNOSIS — Z79899 Other long term (current) drug therapy: Secondary | ICD-10-CM | POA: Diagnosis not present

## 2018-03-18 DIAGNOSIS — N183 Chronic kidney disease, stage 3 unspecified: Secondary | ICD-10-CM

## 2018-03-18 DIAGNOSIS — I739 Peripheral vascular disease, unspecified: Secondary | ICD-10-CM | POA: Insufficient documentation

## 2018-03-18 DIAGNOSIS — I5041 Acute combined systolic (congestive) and diastolic (congestive) heart failure: Secondary | ICD-10-CM

## 2018-03-18 DIAGNOSIS — Z886 Allergy status to analgesic agent status: Secondary | ICD-10-CM | POA: Insufficient documentation

## 2018-03-18 DIAGNOSIS — I255 Ischemic cardiomyopathy: Secondary | ICD-10-CM | POA: Diagnosis not present

## 2018-03-18 DIAGNOSIS — Z7901 Long term (current) use of anticoagulants: Secondary | ICD-10-CM | POA: Insufficient documentation

## 2018-03-18 DIAGNOSIS — Z951 Presence of aortocoronary bypass graft: Secondary | ICD-10-CM | POA: Insufficient documentation

## 2018-03-18 DIAGNOSIS — F17290 Nicotine dependence, other tobacco product, uncomplicated: Secondary | ICD-10-CM | POA: Diagnosis not present

## 2018-03-18 DIAGNOSIS — I252 Old myocardial infarction: Secondary | ICD-10-CM | POA: Diagnosis not present

## 2018-03-18 DIAGNOSIS — I5022 Chronic systolic (congestive) heart failure: Secondary | ICD-10-CM | POA: Diagnosis present

## 2018-03-18 LAB — COMPREHENSIVE METABOLIC PANEL
ALBUMIN: 3.6 g/dL (ref 3.5–5.0)
ALT: 38 U/L (ref 0–44)
AST: 35 U/L (ref 15–41)
Alkaline Phosphatase: 79 U/L (ref 38–126)
Anion gap: 12 (ref 5–15)
BILIRUBIN TOTAL: 0.3 mg/dL (ref 0.3–1.2)
BUN: 46 mg/dL — AB (ref 8–23)
CHLORIDE: 101 mmol/L (ref 98–111)
CO2: 24 mmol/L (ref 22–32)
Calcium: 9.6 mg/dL (ref 8.9–10.3)
Creatinine, Ser: 4.25 mg/dL — ABNORMAL HIGH (ref 0.61–1.24)
GFR calc Af Amer: 15 mL/min — ABNORMAL LOW (ref >60)
GFR calc non Af Amer: 13 mL/min — ABNORMAL LOW (ref >60)
GLUCOSE: 123 mg/dL — AB (ref 70–99)
POTASSIUM: 3.8 mmol/L (ref 3.5–5.1)
SODIUM: 137 mmol/L (ref 135–145)
Total Protein: 7.8 g/dL (ref 6.5–8.1)

## 2018-03-18 LAB — TSH: TSH: 2.463 u[IU]/mL (ref 0.350–4.500)

## 2018-03-18 MED ORDER — METOPROLOL SUCCINATE ER 25 MG PO TB24: 25.0000 mg | ORAL_TABLET | Freq: Every day | ORAL | 3 refills | Status: DC

## 2018-03-18 NOTE — Telephone Encounter (Signed)
Pt called with no answer voice mail left for pt to call back Stop spironolactone.  Hold Lasix for 2 days then decrease to 40 mg daily after that.  BMET 1 week.  Needs an appointment scheduled with his nephrologist.

## 2018-03-18 NOTE — Patient Instructions (Signed)
Labs were drawn today.  EKG was done.  INCREASE Toprol XL to 25mg  daily.  INCREASE BOOST supplements to twice a day.  Your physician recommends that you schedule a follow-up appointment in 2 MONTHS with Dr Shirlee LatchMcLean

## 2018-03-20 ENCOUNTER — Other Ambulatory Visit (HOSPITAL_COMMUNITY): Payer: Self-pay | Admitting: Cardiology

## 2018-03-20 NOTE — Progress Notes (Signed)
Advanced Heart Failure Clinic Note    PCP: Renford Dills, MD PCP-Cardiologist: No primary care provider on file.  HF cardiologist: Dr Shirlee Latch  HPI: Derrick Marsh is a 73 y.o. male with a history of hypertension, hyperlipidemia, COPD, gout, CAD, CABGin 2000, PAD, tobacco abuse, CKD stage 3, and PVCs.  Admitted 6/14-6/25/19 with atypical atrial flutter and acute systolic HF. Echo showed EF 20-25% with moderate RV dysfunction. He was diuresed with IV lasix. He had AKI on CKD stage 3 with creatinine peak of 3.17.  Creatinine improved with holding diuresis. He required milrinone for low output HF. Underwent successful TEE/DCCV, but procedure complicated by hypotension requiring pressors and respiratory distress requiring intubation. He converted back into atrial flutter the following day, but then converted to NSR with amiodarone. EP did not think he was a good ablation candidate. Transitioned to po amiodarone and Eliquis prior to discharge. He was also treated for PNA. He was discharged with home O2. DC weight: 106 lbs.    Most recent echo in 10/19 showed EF 20-25%, mild LV dilation, PASP 48 mmHg.   He returns today for followup of CHF and CAD.  Weight is down 4 lbs.   He seems to be doing well symptomatically.  No palpitations, he is in NSR today. No dyspnea except when he tries to walk fast. No chest pain.  No orthopnea/PND.  He see Dr. Signe Colt for nephrology.    ECG (personally reviewed): NSR, left axis deviation, IVCD 128 msec  Labs (7/19): TSH normal, LFTs normal, hgb 11.5 Labs (8/19): K 3.7, creatinine 2.29 Labs (10/19): K 3.5, creatinine 2.68, AST 43, ALT normal, TSH normal, hgb 11.2  Review of systems complete and found to be negative unless listed in HPI.    PMH: 1. CAD: Acute inferior MI in 2000 with CABG => LIMA-LAD, SVG-OM, SVG-RCA.  2. PAD 3. HTN 4. Hyperlipidemia 5. PVCs 6. COPD: Quit smoking in 5/19.  7. Chronic systolic CHF: Ischemic cardiomyopathy.  - Echo (10/19): EF  20-25%, mild LV dilation, diffuse hypokinesis, PASP 48 mmHg.  8. Gout 9. Atrial flutter: Atypical in 6/19, required DCCV and amiodarone.  Seen by EP, not thought to be a good ablation candidate.    Current Outpatient Medications  Medication Sig Dispense Refill  . amiodarone (PACERONE) 200 MG tablet Take 1 tablet (200 mg total) by mouth daily. 30 tablet 11  . apixaban (ELIQUIS) 2.5 MG TABS tablet Take 1 tablet (2.5 mg total) by mouth 2 (two) times daily. 60 tablet 6  . atorvastatin (LIPITOR) 40 MG tablet Take 1 tablet (40 mg total) by mouth daily at 6 PM. Please keep upcoming appt in January with Dr. Mayford Knife for future refills. Thank you 30 tablet 2  . colchicine 0.6 MG tablet Take 0.6 mg by mouth daily as needed (for gout flares).     . furosemide (LASIX) 80 MG tablet Take 1 tablet (80 mg total) by mouth daily. 30 tablet 11  . metoprolol succinate (TOPROL-XL) 25 MG 24 hr tablet Take 1 tablet (25 mg total) by mouth daily. Take with or immediately following a meal. 90 tablet 3  . spironolactone (ALDACTONE) 25 MG tablet Take 0.5 tablets (12.5 mg total) by mouth daily. 15 tablet 3   No current facility-administered medications for this encounter.     Allergies  Allergen Reactions  . Aleve [Naproxen] Other (See Comments)    Was told by a MD to not take this; conflicted with his other meds      Social  History   Socioeconomic History  . Marital status: Single    Spouse name: Not on file  . Number of children: Not on file  . Years of education: Not on file  . Highest education level: Not on file  Occupational History  . Not on file  Social Needs  . Financial resource strain: Not on file  . Food insecurity:    Worry: Not on file    Inability: Not on file  . Transportation needs:    Medical: Not on file    Non-medical: Not on file  Tobacco Use  . Smoking status: Former Smoker    Packs/day: 0.50    Types: Cigars    Last attempt to quit: 07/14/2017    Years since quitting: 0.6  .  Smokeless tobacco: Never Used  Substance and Sexual Activity  . Alcohol use: No  . Drug use: No  . Sexual activity: Not on file  Lifestyle  . Physical activity:    Days per week: Not on file    Minutes per session: Not on file  . Stress: Not on file  Relationships  . Social connections:    Talks on phone: Not on file    Gets together: Not on file    Attends religious service: Not on file    Active member of club or organization: Not on file    Attends meetings of clubs or organizations: Not on file    Relationship status: Not on file  . Intimate partner violence:    Fear of current or ex partner: Not on file    Emotionally abused: Not on file    Physically abused: Not on file    Forced sexual activity: Not on file  Other Topics Concern  . Not on file  Social History Narrative  . Not on file      Family History  Problem Relation Age of Onset  . Heart disease Father   . Emphysema Father   . CVA Father   . Heart failure Mother     Vitals:   03/18/18 1209  BP: (!) 100/54  Pulse: 68  SpO2: 97%  Weight: 44.4 kg (97 lb 12.8 oz)   Wt Readings from Last 3 Encounters:  03/18/18 44.4 kg (97 lb 12.8 oz)  12/24/17 45.9 kg (101 lb 3.2 oz)  10/26/17 47.2 kg (104 lb)    PHYSICAL EXAM: General: Thin, NAD Neck: No JVD, no thyromegaly or thyroid nodule.  Lungs: Clear to auscultation bilaterally with normal respiratory effort. CV: Nondisplaced PMI.  Heart regular S1/S2, no S3/S4, no murmur.  No peripheral edema.  No carotid bruit.  Normal pedal pulses.  Abdomen: Soft, nontender, no hepatosplenomegaly, no distention.  Skin: Multiple tattoos.   Neurologic: Alert and oriented x 3.  Psych: Normal affect. Extremities: No clubbing or cyanosis.  HEENT: Normal.   ASSESSMENT & PLAN:  1. Chronic systolic HF:  Ischemic cardiomyopathy.  He also has hx of CABG, but unable to do LHC during 6/19 admission due to AKI on CKD stage 3. During that admission, he required milrinone for low  output/cardiogenic shock. Echo (6/19) showed EF 20-25%, moderate RV systolic dysfunction. Echo (10/19) showed EF 20-25%, no significant change.   Symptomatically doing well, NYHA class II.  He is not volume overloaded on exam.  - Continue lasix 80 mg daily. BMET today.  - No ACEI/ARB/ARNI/digoxin with CKD.  - Continue spironolactone 12.5 mg daily.  - Increase Toprol XL to 25 mg daily.  - Cannot rule  out progression of CAD with ischemic CMP though doubt ACS at 6/19 admission. Would not do coronary angiography at this point with high creatinine unless he has clear ACS.  - Would likely not be candidate for advanced therapies with his degree of CKD.  - He qualifies for ICD with persistently low EF but not CRT (do not think QRS is wide enough for him to benefit significantly). We talked about ICD today and he wants to think more about it before I refer him to EP.   2. Atrial flutter: Atypical at 6/19 admission. He had DCCV with recurrence, then started on amiodarone and since then has been in NSR.  He was seen by EP, not thought to be a good ablation candidate.  - Continue amiodarone 200 mg daily.  Check LFTs/TSH today, he will need a regular eye exam.  - Continue eliquis 2.5 mg BID. - If he has more flutter, can add ranolazine.   3. CKD stage 3: Following with Dr Signe ColtUpton now.  - BMET today.   4. CAD: s/p CABG 2000.  No cath since that time. See discussion above, will not cath unless he has significant symptoms.  No chest pain.   - Did not want to do cardiac rehab.  - Continue statin - No ASA given stable CAD with Eliquis use.  5. COPD: He has quit smoking since 5/19.  Not on oxygen.   Marca Anconaalton Chantee Cerino, MD 03/20/18

## 2018-03-25 ENCOUNTER — Telehealth (HOSPITAL_COMMUNITY): Payer: Self-pay

## 2018-03-25 ENCOUNTER — Ambulatory Visit (HOSPITAL_COMMUNITY)
Admission: RE | Admit: 2018-03-25 | Discharge: 2018-03-25 | Disposition: A | Discharge status: Home / Self Care | Payer: Medicare Other | Source: Ambulatory Visit | Attending: Cardiology | Admitting: Cardiology

## 2018-03-25 DIAGNOSIS — I420 Dilated cardiomyopathy: Secondary | ICD-10-CM

## 2018-03-25 LAB — BASIC METABOLIC PANEL
ANION GAP: 7 (ref 5–15)
BUN: 30 mg/dL — ABNORMAL HIGH (ref 8–23)
CHLORIDE: 103 mmol/L (ref 98–111)
CO2: 27 mmol/L (ref 22–32)
Calcium: 9.4 mg/dL (ref 8.9–10.3)
Creatinine, Ser: 2.4 mg/dL — ABNORMAL HIGH (ref 0.61–1.24)
GFR calc non Af Amer: 26 mL/min — ABNORMAL LOW (ref >60)
GFR, EST AFRICAN AMERICAN: 30 mL/min — AB (ref >60)
Glucose, Bld: 138 mg/dL — ABNORMAL HIGH (ref 70–99)
Potassium: 3.9 mmol/L (ref 3.5–5.1)
Sodium: 137 mmol/L (ref 135–145)

## 2018-03-25 NOTE — Telephone Encounter (Signed)
Pt called given lab results verbalized understanding  

## 2018-04-05 ENCOUNTER — Other Ambulatory Visit (HOSPITAL_COMMUNITY): Payer: Self-pay | Admitting: *Deleted

## 2018-04-05 MED ORDER — APIXABAN 2.5 MG PO TABS: 2.5000 mg | ORAL_TABLET | Freq: Two times a day (BID) | ORAL | 6 refills | Status: DC

## 2018-04-08 ENCOUNTER — Ambulatory Visit: Payer: Medicare Other | Admitting: Cardiology

## 2018-04-08 ENCOUNTER — Other Ambulatory Visit (HOSPITAL_COMMUNITY): Payer: Self-pay

## 2018-04-08 ENCOUNTER — Encounter: Payer: Self-pay | Admitting: Cardiology

## 2018-04-08 VITALS — BP 120/54 | HR 49 | Ht 62.0 in | Wt 102.8 lb

## 2018-04-08 DIAGNOSIS — I420 Dilated cardiomyopathy: Secondary | ICD-10-CM | POA: Diagnosis not present

## 2018-04-08 DIAGNOSIS — I10 Essential (primary) hypertension: Secondary | ICD-10-CM | POA: Diagnosis not present

## 2018-04-08 DIAGNOSIS — E785 Hyperlipidemia, unspecified: Secondary | ICD-10-CM

## 2018-04-08 DIAGNOSIS — I2510 Atherosclerotic heart disease of native coronary artery without angina pectoris: Secondary | ICD-10-CM

## 2018-04-08 DIAGNOSIS — I4892 Unspecified atrial flutter: Secondary | ICD-10-CM | POA: Diagnosis not present

## 2018-04-08 DIAGNOSIS — I5042 Chronic combined systolic (congestive) and diastolic (congestive) heart failure: Secondary | ICD-10-CM

## 2018-04-08 LAB — BASIC METABOLIC PANEL
BUN/Creatinine Ratio: 9 — ABNORMAL LOW (ref 10–24)
BUN: 23 mg/dL (ref 8–27)
CALCIUM: 9.8 mg/dL (ref 8.6–10.2)
CHLORIDE: 101 mmol/L (ref 96–106)
CO2: 24 mmol/L (ref 20–29)
CREATININE: 2.51 mg/dL — AB (ref 0.76–1.27)
GFR, EST AFRICAN AMERICAN: 28 mL/min/{1.73_m2} — AB (ref >59)
GFR, EST NON AFRICAN AMERICAN: 25 mL/min/{1.73_m2} — AB (ref >59)
Glucose: 96 mg/dL (ref 65–99)
POTASSIUM: 4.4 mmol/L (ref 3.5–5.2)
Sodium: 142 mmol/L (ref 134–144)

## 2018-04-08 LAB — HEPATIC FUNCTION PANEL
ALBUMIN: 4.1 g/dL (ref 3.7–4.7)
ALT: 26 IU/L (ref 0–44)
AST: 35 IU/L (ref 0–40)
Alkaline Phosphatase: 112 IU/L (ref 39–117)
BILIRUBIN TOTAL: 0.5 mg/dL (ref 0.0–1.2)
Bilirubin, Direct: 0.17 mg/dL (ref 0.00–0.40)
Total Protein: 7.1 g/dL (ref 6.0–8.5)

## 2018-04-08 LAB — LIPID PANEL
CHOL/HDL RATIO: 3.1 ratio (ref 0.0–5.0)
Cholesterol, Total: 172 mg/dL (ref 100–199)
HDL: 56 mg/dL (ref >39)
LDL Calculated: 97 mg/dL (ref 0–99)
TRIGLYCERIDES: 95 mg/dL (ref 0–149)
VLDL CHOLESTEROL CAL: 19 mg/dL (ref 5–40)

## 2018-04-08 MED ORDER — APIXABAN 2.5 MG PO TABS: 2.5000 mg | ORAL_TABLET | Freq: Two times a day (BID) | ORAL | 6 refills | Status: DC

## 2018-04-08 NOTE — Progress Notes (Signed)
Cardiology Office Note:    Date:  04/08/2018   ID:  Derrick Marsh, DOB January 03, 1946, MRN [id number]  PCP:  Renford Dills, MD  Cardiologist:  No primary care provider on file.    Referring MD: Renford Dills, MD   Chief Complaint  Patient presents with  . Coronary Artery Disease  . Atrial Flutter  . Hypertension  . Congestive Heart Failure  . Cardiomyopathy    History of Present Illness:    Derrick Marsh is a 73 y.o. male with a hx of hypertension, hyperlipidemia, COPD, gout, CAD, CABGin 2000, PAD, tobacco abuse, CKD stage 3,andPVCs.  Admitted 6/14-6/25/19 with atypical atrial flutter and acute systolic HF. Echo showed EF 20-25% with moderate RV dysfunction. He was diuresed with IV lasix. He had AKI on CKD stage 3 with creatinine peak of 3.17.  Creatinine improved with holding diuresis. He required milrinone for low output HF. Underwent successful TEE/DCCV, but procedure complicated by hypotension requiring pressors and respiratory distress requiring intubation. He converted back into atrial flutter the following day, but then converted to NSR with amiodarone. EP did not think he was a good ablation candidate. Transitioned to po amiodarone and Eliquis prior to discharge. He was also treated for PNA. He was discharged with home O2. DC weight: 106 lbs.    Most recent echo in 10/19 showed EF 20-25%, mild LV dilation, PASP 48 mmHg.   Seen 03/18/2018 for followup in HF clinic.  Weight was  down 4 lbs and was doing well symptomatically and in NSR .  Toprol was increased to 25 mg daily.  It was recommended no coronary angiography be done due to his chronic kidney disease unless he has clear symptoms of ACS.  He is not a candidate for advanced therapies due to chronic kidney disease.  Dr. Sherlie Ban discussed the possibility of ICD but not CRT as his QRS was not widened off.  The patient did not want consider at that office visit and wanted to think about it.  Also did not want any cardiac  rehab.  He is here today for followup and is doing well.  He denies any chest pain or pressure, SOB, DOE, PND, orthopnea, LE edema, dizziness, palpitations or syncope. he is compliant with his meds and is tolerating meds with no SE.    Past Medical History:  Diagnosis Date  . Allergic rhinitis   . Atherosclerosis of native arteries of the extremities with intermittent claudication   . 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 HTN (hypertension)   . Hypercholesteremia   . PVC's (premature ventricular contractions)   . Tobacco abuse     Past Surgical History:  Procedure Laterality Date  . CARDIOVERSION N/A 08/30/2017   Procedure: CARDIOVERSION;  Surgeon: Chrystie Nose, MD;  Location: [facility] [place] ENDOSCOPY;  Service: Cardiovascular;  Laterality: N/A;  . CORONARY ARTERY BYPASS GRAFT  2000  . TEE WITHOUT CARDIOVERSION N/A 08/30/2017   Procedure: TRANSESOPHAGEAL ECHOCARDIOGRAM (TEE);  Surgeon: Chrystie Nose, MD;  Location: [facility] ENDOSCOPY;  Service: Cardiovascular;  Laterality: N/A;    Current Medications: Current Meds  Medication Sig  . amiodarone (PACERONE) 200 MG tablet Take 1 tablet (200 mg total) by mouth daily.  Marland Kitchen apixaban (ELIQUIS) 2.5 MG TABS tablet Take 1 tablet (2.5 mg total) by mouth 2 (two) times daily.  Marland Kitchen atorvastatin (LIPITOR) 40 MG tablet Take 1 tablet (40 mg total)  by mouth daily at 6 PM. Please keep upcoming appt in January with Dr. Mayford Knife for future refills. Thank you  . colchicine 0.6 MG tablet Take 0.6 mg by mouth daily as needed (for gout flares).   . furosemide (LASIX) 40 MG tablet Take 40 mg by mouth daily.  . metoprolol succinate (TOPROL-XL) 25 MG 24 hr tablet Take 1 tablet (25 mg total) by mouth daily. Take with or immediately following a meal.     Allergies:   Aleve [naproxen]   Social History   Socioeconomic History  . Marital status: Single    Spouse name: Not on file  . Number of  children: Not on file  . Years of education: Not on file  . Highest education level: Not on file  Occupational History  . Not on file  Social Needs  . Financial resource strain: Not on file  . Food insecurity:    Worry: Not on file    Inability: Not on file  . Transportation needs:    Medical: Not on file    Non-medical: Not on file  Tobacco Use  . Smoking status: Former Smoker    Packs/day: 0.50    Types: Cigars    Last attempt to quit: 07/14/2017    Years since quitting: 0.7  . Smokeless tobacco: Never Used  Substance and Sexual Activity  . Alcohol use: No  . Drug use: No  . Sexual activity: Not on file  Lifestyle  . Physical activity:    Days per week: Not on file    Minutes per session: Not on file  . Stress: Not on file  Relationships  . Social connections:    Talks on phone: Not on file    Gets together: Not on file    Attends religious service: Not on file    Active member of club or organization: Not on file    Attends meetings of clubs or organizations: Not on file    Relationship status: Not on file  Other Topics Concern  . Not on file  Social History Narrative  . Not on file     Family History: The patient's family history includes CVA in his father; Emphysema in his father; Heart disease in his father; Heart failure in his mother.  ROS:   Please see the history of present illness.    ROS  All other systems reviewed and negative.   EKGs/Labs/Other Studies Reviewed:    The following studies were reviewed today: Prior notes from hospitalization, CHF notes and echo  EKG:  EKG is not ordered today.  Recent Labs: 08/31/2017: B Natriuretic Peptide 1,505.7 09/01/2017: Magnesium 2.2 12/24/2017: Hemoglobin 11.2; Platelets 481 03/18/2018: ALT 38; TSH 2.463 03/25/2018: BUN 30; Creatinine, Ser 2.40; Potassium 3.9; Sodium 137   Recent Lipid Panel    Component Value Date/Time   CHOL 119 08/28/2017 0520   TRIG 41 08/28/2017 0520   HDL 32 (L) 08/28/2017 0520     CHOLHDL 3.7 08/28/2017 0520   VLDL 8 08/28/2017 0520   LDLCALC 79 08/28/2017 0520    Physical Exam:    VS:  BP (!) 120/54   Pulse (!) 49   Ht 5\' 2"  (1.575 m)   Wt 102 lb 12.8 oz (46.6 kg)   SpO2 93%   BMI 18.80 kg/m     Wt Readings from Last 3 Encounters:  04/08/18 102 lb 12.8 oz (46.6 kg)  03/18/18 97 lb 12.8 oz (44.4 kg)  12/24/17 101 lb 3.2 oz (45.9 kg)  GEN:  Well nourished, well developed in no acute distress HEENT: Normal NECK: No JVD; No carotid bruits LYMPHATICS: No lymphadenopathy CARDIAC: RRR, no murmurs, rubs, gallops RESPIRATORY:  Clear to auscultation without rales, wheezing or rhonchi  ABDOMEN: Soft, non-tender, non-distended MUSCULOSKELETAL:  No edema; No deformity  SKIN: Warm and dry NEUROLOGIC:  Alert and oriented x 3 PSYCHIATRIC:  Normal affect   ASSESSMENT:    1. CAD, multiple vessel -s/p CABG   2. Essential hypertension   3. Dilated cardiomyopathy (HCC)   4. Paroxysmal atrial flutter (HCC)   5. Chronic combined systolic and diastolic heart failure (HCC)   6. Hyperlipidemia with target LDL less than 70    PLAN:    In order of problems listed above:  1.  ASCAD -post remote CABG in 2000.  He has not had any chest pain.  A cath was not done at the time of his admission in June when he was diagnosed with LV dysfunction because of his worsening renal function.  He will continue on statin therapy.  He is not on aspirin due to apixaban.  2.  HTN -BP is well controlled on exam today.  He will continue on Toprol-XL 25 mg daily.  3.  DCM -EF has not changed from echo in June to October staying at 20 to 25%.  AICD placement without CRT was recommended by CHF clinic patient at that point declined and wanted to think about it.  4.  Paroxysmal atrial flutter -he had atypical flutter back in June 2019 and underwent cardioversion but unfortunately had a recurrence.  He has been maintaining sinus rhythm since being started on amiodarone.  Felt not to be a  candidate for ablation per EP.  He will continue on apixaban 2.5 mg twice daily dosed for  weight less than 60 kg and creatinine greater than 1.5.  He will continue on Toprol-XL 25 mg daily and amiodarone 200 mg daily.  His TSH was 2.463 few weeks ago and ALT was normal at 38.  He has not had baseline PFTs with DLCO since starting amiodarone so I will order these today.  5.  Chronic combined systolic/diastolic CHF -unclear whether this is ischemic versus nonischemic cardiomyopathy.  He has had normal LV function in the past up until his admission in June when he developed low output cardiogenic shock after cardioversion requiring milrinone complicated by acute on chronic kidney disease stage III.  Could not do left heart cath due to worsening renal function.  EF as well as October showed EF 20 to 25% with no change.  He continues to be NYHA class II.  He does not appear volume overloaded on exam today.  Creatinine was 2.4 on 03/25/2018 and potassium 3.9.  His Aldactone was stopped due to chronic kidney disease.  He is not a candidate for ACEI/ARB/ARNi/digoxin/Entresto due to chronic kidney disease.   Continue on Toprol-XL 25 mg daily and Lasix 40 mg daily.  His BP is soft this today and therefore would not recommend starting hydralazine/Imdur but this could be considered in the future.  I did discuss with him recommendations for AICD placement again but at this point he wants to keep thinking about it.  6.  Hyperlipidemia -LDL goal is less than 70.  His LDL was 79 on 08/28/2017.  I will repeat an FLP and ALT.  He will continue for now on Lipitor 40 mg daily.  I have spent a total of 45 minutes with very patient reviewing hospital notes from 08/2017,  2D echoes , telemetry, EKGs, labs and examining patient as well as establishing an assessment and plan that was discussed with the patient.  > 50% of time was spent in direct patient care.     Medication Adjustments/Labs and Tests Ordered: Current medicines are  reviewed at length with the patient today.  Concerns regarding medicines are outlined above.  Orders Placed This Encounter  Procedures  . Basic metabolic panel  . Hepatic function panel  . Lipid panel  . Pulmonary function test   No orders of the defined types were placed in this encounter.   Signed, Armanda Magicraci Keeana Pieratt, MD  04/08/2018 11:04 AM    [facility]

## 2018-04-08 NOTE — Patient Instructions (Addendum)
Medication Instructions:  Your physician recommends that you continue on your current medications as directed. Please refer to the Current Medication list given to you today.  If you need a refill on your cardiac medications before your next appointment, please call your pharmacy.   Lab work: Today: BMET, LFT and Lipids  If you have labs (blood work) drawn today and your tests are completely normal, you will receive your results only by: Marland Kitchen MyChart Message (if you have MyChart) OR . A paper copy in the mail If you have any lab test that is abnormal or we need to change your treatment, we will call you to review the results.  Testing/Procedures: Your physician has recommended that you have a pulmonary function test. Pulmonary Function Tests are a group of tests that measure how well air moves in and out of your lungs.  Follow-Up: At [facility], you and your health needs are our priority.  As part of our continuing mission to provide you with exceptional heart care, we have created designated Provider Care Teams.  These Care Teams include your primary Cardiologist (physician) and Advanced Practice Providers (APPs -  Physician Assistants and Nurse Practitioners) who all work together to provide you with the care you need, when you need it. You will need a follow up appointment in 6 months.  Please call our office 2 months in advance to schedule this appointment.  You may see Dr. Mayford Knife or one of the following Advanced Practice Providers on your designated Care Team:   [place], PA-C Ronie Spies, PA-C . Jacolyn Reedy, PA-C

## 2018-04-12 ENCOUNTER — Other Ambulatory Visit: Payer: Self-pay | Admitting: *Deleted

## 2018-04-12 DIAGNOSIS — E785 Hyperlipidemia, unspecified: Secondary | ICD-10-CM

## 2018-04-12 DIAGNOSIS — I2510 Atherosclerotic heart disease of native coronary artery without angina pectoris: Secondary | ICD-10-CM

## 2018-04-14 ENCOUNTER — Ambulatory Visit (INDEPENDENT_AMBULATORY_CARE_PROVIDER_SITE_OTHER): Payer: Medicare Other | Admitting: Internal Medicine

## 2018-04-14 DIAGNOSIS — I4892 Unspecified atrial flutter: Secondary | ICD-10-CM

## 2018-04-14 LAB — PULMONARY FUNCTION TEST
DL/VA % PRED: 49 %
DL/VA: 1.96 ml/min/mmHg/L
DLCO unc % pred: 32 %
DLCO unc: 6.91 ml/min/mmHg
FEF 25-75 Post: 2.98 L/sec
FEF 25-75 Pre: 2.5 L/sec
FEF2575-%CHANGE-POST: 19 %
FEF2575-%PRED-POST: 183 %
FEF2575-%Pred-Pre: 154 %
FEV1-%Change-Post: -1 %
FEV1-%PRED-PRE: 94 %
FEV1-%Pred-Post: 93 %
FEV1-PRE: 2.06 L
FEV1-Post: 2.04 L
FEV1FVC-%Change-Post: 1 %
FEV1FVC-%Pred-Pre: 121 %
FEV6-%Change-Post: -2 %
FEV6-%PRED-POST: 80 %
FEV6-%PRED-PRE: 82 %
FEV6-PRE: 2.34 L
FEV6-Post: 2.27 L
FEV6FVC-%Pred-Post: 107 %
FEV6FVC-%Pred-Pre: 107 %
FVC-%CHANGE-POST: -2 %
FVC-%PRED-POST: 74 %
FVC-%PRED-PRE: 76 %
FVC-Post: 2.27 L
FVC-Pre: 2.34 L
PRE FEV1/FVC RATIO: 88 %
PRE FEV6/FVC RATIO: 100 %
Post FEV1/FVC ratio: 90 %
Post FEV6/FVC ratio: 100 %
RV % pred: 52 %
RV: 1.09 L
TLC % pred: 61 %
TLC: 3.32 L

## 2018-04-14 NOTE — Progress Notes (Signed)
PFT done today. 

## 2018-04-18 ENCOUNTER — Telehealth: Payer: Self-pay

## 2018-04-18 DIAGNOSIS — R942 Abnormal results of pulmonary function studies: Secondary | ICD-10-CM

## 2018-04-18 NOTE — Telephone Encounter (Signed)
Spoke with the patient, he accepted his referral to Pulmonology. He had no further questions.

## 2018-04-18 NOTE — Telephone Encounter (Signed)
-----   Message from Quintella Reichert, MD sent at 04/16/2018  9:44 PM EST ----- PFTs show evidence of possible pulmonary fibrosis - Amio can contribute to this so I would like him to see Dr. Marchelle Gearing for further evaulation.

## 2018-04-29 ENCOUNTER — Ambulatory Visit
Admission: RE | Admit: 2018-04-29 | Discharge: 2018-04-29 | Disposition: A | Discharge status: Home / Self Care | Payer: Medicare Other | Source: Ambulatory Visit | Attending: Internal Medicine | Admitting: Internal Medicine

## 2018-04-29 ENCOUNTER — Other Ambulatory Visit: Payer: Self-pay | Admitting: Internal Medicine

## 2018-04-29 DIAGNOSIS — R634 Abnormal weight loss: Secondary | ICD-10-CM

## 2018-05-05 ENCOUNTER — Other Ambulatory Visit: Payer: Self-pay | Admitting: Internal Medicine

## 2018-05-05 DIAGNOSIS — R9389 Abnormal findings on diagnostic imaging of other specified body structures: Secondary | ICD-10-CM

## 2018-05-12 ENCOUNTER — Ambulatory Visit
Admission: RE | Admit: 2018-05-12 | Discharge: 2018-05-12 | Disposition: A | Discharge status: Home / Self Care | Payer: Medicare Other | Source: Ambulatory Visit | Attending: Internal Medicine | Admitting: Internal Medicine

## 2018-05-12 DIAGNOSIS — R9389 Abnormal findings on diagnostic imaging of other specified body structures: Secondary | ICD-10-CM

## 2018-05-24 ENCOUNTER — Other Ambulatory Visit: Payer: Medicare Other

## 2018-05-24 ENCOUNTER — Encounter (HOSPITAL_COMMUNITY): Payer: Self-pay | Admitting: Cardiology

## 2018-05-24 ENCOUNTER — Ambulatory Visit (HOSPITAL_COMMUNITY)
Admission: RE | Admit: 2018-05-24 | Discharge: 2018-05-24 | Disposition: A | Discharge status: Home / Self Care | Payer: Medicare Other | Source: Ambulatory Visit | Attending: Cardiology | Admitting: Cardiology

## 2018-05-24 ENCOUNTER — Other Ambulatory Visit: Payer: Self-pay

## 2018-05-24 VITALS — BP 116/66 | HR 52 | Wt 103.0 lb

## 2018-05-24 DIAGNOSIS — I5032 Chronic diastolic (congestive) heart failure: Secondary | ICD-10-CM | POA: Insufficient documentation

## 2018-05-24 DIAGNOSIS — Z8249 Family history of ischemic heart disease and other diseases of the circulatory system: Secondary | ICD-10-CM | POA: Insufficient documentation

## 2018-05-24 DIAGNOSIS — I252 Old myocardial infarction: Secondary | ICD-10-CM | POA: Insufficient documentation

## 2018-05-24 DIAGNOSIS — J449 Chronic obstructive pulmonary disease, unspecified: Secondary | ICD-10-CM | POA: Diagnosis not present

## 2018-05-24 DIAGNOSIS — I130 Hypertensive heart and chronic kidney disease with heart failure and stage 1 through stage 4 chronic kidney disease, or unspecified chronic kidney disease: Secondary | ICD-10-CM | POA: Insufficient documentation

## 2018-05-24 DIAGNOSIS — M109 Gout, unspecified: Secondary | ICD-10-CM | POA: Diagnosis not present

## 2018-05-24 DIAGNOSIS — I5042 Chronic combined systolic (congestive) and diastolic (congestive) heart failure: Secondary | ICD-10-CM | POA: Diagnosis not present

## 2018-05-24 DIAGNOSIS — E785 Hyperlipidemia, unspecified: Secondary | ICD-10-CM | POA: Insufficient documentation

## 2018-05-24 DIAGNOSIS — N183 Chronic kidney disease, stage 3 unspecified: Secondary | ICD-10-CM

## 2018-05-24 DIAGNOSIS — I4892 Unspecified atrial flutter: Secondary | ICD-10-CM

## 2018-05-24 DIAGNOSIS — Z79899 Other long term (current) drug therapy: Secondary | ICD-10-CM | POA: Diagnosis not present

## 2018-05-24 DIAGNOSIS — I5022 Chronic systolic (congestive) heart failure: Secondary | ICD-10-CM | POA: Diagnosis not present

## 2018-05-24 DIAGNOSIS — I255 Ischemic cardiomyopathy: Secondary | ICD-10-CM | POA: Insufficient documentation

## 2018-05-24 DIAGNOSIS — J8410 Pulmonary fibrosis, unspecified: Secondary | ICD-10-CM | POA: Diagnosis not present

## 2018-05-24 DIAGNOSIS — Z951 Presence of aortocoronary bypass graft: Secondary | ICD-10-CM | POA: Diagnosis not present

## 2018-05-24 DIAGNOSIS — I484 Atypical atrial flutter: Secondary | ICD-10-CM | POA: Diagnosis not present

## 2018-05-24 DIAGNOSIS — I2510 Atherosclerotic heart disease of native coronary artery without angina pectoris: Secondary | ICD-10-CM | POA: Insufficient documentation

## 2018-05-24 DIAGNOSIS — Z886 Allergy status to analgesic agent status: Secondary | ICD-10-CM | POA: Diagnosis not present

## 2018-05-24 DIAGNOSIS — Z7901 Long term (current) use of anticoagulants: Secondary | ICD-10-CM | POA: Diagnosis not present

## 2018-05-24 DIAGNOSIS — Z87891 Personal history of nicotine dependence: Secondary | ICD-10-CM | POA: Diagnosis not present

## 2018-05-24 LAB — COMPREHENSIVE METABOLIC PANEL
ALBUMIN: 3.4 g/dL — AB (ref 3.5–5.0)
ALT: 31 U/L (ref 0–44)
ANION GAP: 7 (ref 5–15)
AST: 38 U/L (ref 15–41)
Alkaline Phosphatase: 97 U/L (ref 38–126)
BILIRUBIN TOTAL: 0.6 mg/dL (ref 0.3–1.2)
BUN: 21 mg/dL (ref 8–23)
CO2: 28 mmol/L (ref 22–32)
Calcium: 9.1 mg/dL (ref 8.9–10.3)
Chloride: 106 mmol/L (ref 98–111)
Creatinine, Ser: 2.59 mg/dL — ABNORMAL HIGH (ref 0.61–1.24)
GFR calc Af Amer: 27 mL/min — ABNORMAL LOW (ref >60)
GFR calc non Af Amer: 24 mL/min — ABNORMAL LOW (ref >60)
GLUCOSE: 100 mg/dL — AB (ref 70–99)
POTASSIUM: 4.1 mmol/L (ref 3.5–5.1)
Sodium: 141 mmol/L (ref 135–145)
TOTAL PROTEIN: 6.9 g/dL (ref 6.5–8.1)

## 2018-05-24 LAB — CBC
HCT: 34.3 % — ABNORMAL LOW (ref 39.0–52.0)
HEMOGLOBIN: 11 g/dL — AB (ref 13.0–17.0)
MCH: 30.9 pg (ref 26.0–34.0)
MCHC: 32.1 g/dL (ref 30.0–36.0)
MCV: 96.3 fL (ref 80.0–100.0)
PLATELETS: 391 10*3/uL (ref 150–400)
RBC: 3.56 MIL/uL — ABNORMAL LOW (ref 4.22–5.81)
RDW: 14.7 % (ref 11.5–15.5)
WBC: 9.4 10*3/uL (ref 4.0–10.5)
nRBC: 0 % (ref 0.0–0.2)

## 2018-05-24 LAB — IRON AND TIBC
Iron: 43 ug/dL — ABNORMAL LOW (ref 45–182)
Saturation Ratios: 12 % — ABNORMAL LOW (ref 17.9–39.5)
TIBC: 361 ug/dL (ref 250–450)
UIBC: 318 ug/dL

## 2018-05-24 LAB — FERRITIN: FERRITIN: 77 ng/mL (ref 24–336)

## 2018-05-24 MED ORDER — FUROSEMIDE 40 MG PO TABS: 60.0000 mg | ORAL_TABLET | Freq: Every day | ORAL | 6 refills | Status: DC

## 2018-05-24 NOTE — Progress Notes (Signed)
ReDS Vest - 05/24/18 1100      ReDS Vest   Fitting Posture  Sitting    Height Marker  Short    Ruler Value  24   STATION C   Center Strip  Aligned    ReDS Value  38

## 2018-05-24 NOTE — Progress Notes (Signed)
Advanced Heart Failure Clinic Note    PCP: Seward Carol, MD PCP-Cardiologist: No primary care provider on file.  HF cardiologist: Dr Aundra Dubin  HPI: Derrick Marsh is a 73 y.o. male with a history of hypertension, hyperlipidemia, COPD, gout, CAD, CABGin 2000, PAD, tobacco abuse, CKD stage 3, and PVCs.  Admitted 6/14-6/25/19 with atypical atrial flutter and acute systolic HF. Echo showed EF 20-25% with moderate RV dysfunction. He was diuresed with IV lasix. He had AKI on CKD stage 3 with creatinine peak of 3.17.  Creatinine improved with holding diuresis. He required milrinone for low output HF. Underwent successful TEE/DCCV, but procedure complicated by hypotension requiring pressors and respiratory distress requiring intubation. He converted back into atrial flutter the following day, but then converted to NSR with amiodarone. EP did not think he was a good ablation candidate. Transitioned to po amiodarone and Eliquis prior to discharge. He was also treated for PNA. He was discharged with home O2. DC weight: 106 lbs.    Most recent echo in 10/19 showed EF 20-25%, mild LV dilation, PASP 48 mmHg.   PFTs in 1/20 showed moderate-severe restriction with severely decreased DLCO.  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   He returns today for followup of CHF and CAD.  He is off spironolactone due to rise in creatinine, Lasix was also cut back. Breathing has been "up and down."  No dyspnea walking on flat ground, he gets short of breath with stairs. No orthopnea/PND.  No chest pain. No BRBPR/melena. Weight up about 6 lbs.     REDS clip value: 38%  ECG (personally reviewed): NSR, IVCD 126 msec  Labs (7/19): TSH normal, LFTs normal, hgb 11.5 Labs (8/19): K 3.7, creatinine 2.29 Labs (10/19): K 3.5, creatinine 2.68, AST 43, ALT normal, TSH normal, hgb 11.2 Labs (1/20): K 4.4, creatinine 2.5, LDL 97, LFTs normal, TSH normal  Review of  systems complete and found to be negative unless listed in HPI.    PMH: 1. CAD: Acute inferior MI in 2000 with CABG => LIMA-LAD, SVG-OM, SVG-RCA.  2. PAD 3. HTN 4. Hyperlipidemia 5. PVCs 6. COPD: Quit smoking in 5/19.  7. Chronic systolic CHF: Ischemic cardiomyopathy.  - Echo (10/19): EF 20-25%, mild LV dilation, diffuse hypokinesis, PASP 48 mmHg.  8. Gout 9. Atrial flutter: Atypical in 6/19, required DCCV and amiodarone.  Seen by EP, not thought to be a good ablation candidate.  10. Interstitial lung disease:  - PFTs (1/20): moderate-severe restriction with severely decreased DLCO. 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    Current Outpatient Medications  Medication Sig Dispense Refill  . apixaban (ELIQUIS) 2.5 MG TABS tablet Take 1 tablet (2.5 mg total) by mouth 2 (two) times daily. 60 tablet 6  . atorvastatin (LIPITOR) 80 MG tablet Take 80 mg by mouth daily at 6 PM.    . colchicine 0.6 MG tablet Take 0.6 mg by mouth daily as needed (for gout flares).     . furosemide (LASIX) 40 MG tablet Take 1.5 tablets (60 mg total) by mouth daily. 45 tablet 6  . metoprolol succinate (TOPROL-XL) 25 MG 24 hr tablet Take 1 tablet (25 mg total) by mouth daily. Take with or immediately following a meal. 90 tablet 3   No current facility-administered medications for this encounter.     Allergies  Allergen Reactions  . Aleve [Naproxen] Other (See Comments)    Was told by  a MD to not take this; conflicted with his other meds      Social History   Socioeconomic History  . Marital status: Single    Spouse name: Not on file  . Number of children: Not on file  . Years of education: Not on file  . Highest education level: Not on file  Occupational History  . Not on file  Social Needs  . Financial resource strain: Not on file  . Food insecurity:    Worry: Not on file    Inability: Not on file  . Transportation needs:    Medical: Not on file    Non-medical:  Not on file  Tobacco Use  . Smoking status: Former Smoker    Packs/day: 0.50    Types: Cigars    Last attempt to quit: 07/14/2017    Years since quitting: 0.8  . Smokeless tobacco: Never Used  Substance and Sexual Activity  . Alcohol use: No  . Drug use: No  . Sexual activity: Not on file  Lifestyle  . Physical activity:    Days per week: Not on file    Minutes per session: Not on file  . Stress: Not on file  Relationships  . Social connections:    Talks on phone: Not on file    Gets together: Not on file    Attends religious service: Not on file    Active member of club or organization: Not on file    Attends meetings of clubs or organizations: Not on file    Relationship status: Not on file  . Intimate partner violence:    Fear of current or ex partner: Not on file    Emotionally abused: Not on file    Physically abused: Not on file    Forced sexual activity: Not on file  Other Topics Concern  . Not on file  Social History Narrative  . Not on file      Family History  Problem Relation Age of Onset  . Heart disease Father   . Emphysema Father   . CVA Father   . Heart failure Mother     Vitals:   05/24/18 1043  BP: 116/66  Pulse: (!) 52  SpO2: 96%  Weight: 46.7 kg (103 lb)   Wt Readings from Last 3 Encounters:  05/24/18 46.7 kg (103 lb)  04/08/18 46.6 kg (102 lb 12.8 oz)  03/18/18 44.4 kg (97 lb 12.8 oz)    PHYSICAL EXAM: General: NAD Neck: No JVD, no thyromegaly or thyroid nodule.  Lungs: mild crackles at bases.  CV: Nondisplaced PMI.  Heart regular S1/S2, no S3/S4, no murmur.  No peripheral edema.  No carotid bruit.  Normal pedal pulses.  Abdomen: Soft, nontender, no hepatosplenomegaly, no distention.  Skin: Intact without lesions or rashes.  Neurologic: Alert and oriented x 3.  Psych: Normal affect. Extremities: No clubbing or cyanosis.  HEENT: Normal.   ASSESSMENT & PLAN:  1. Chronic systolic HF:  Ischemic cardiomyopathy.  He also has hx of  CABG, but unable to do LHC during 6/19 admission due to AKI on CKD stage 3. During that admission, he required milrinone for low output/cardiogenic shock. Echo (6/19) showed EF 20-25%, moderate RV systolic dysfunction. Echo (10/19) showed EF 20-25%, no significant change.   NYHA class II symptoms.  Weight is up and he is mildly volume overloaded by REDS clip measurement since decreasing Lasix.  - Increase Lasix to 60 mg daily with BMET today and 10 days.   -  No ACEI/ARB/ARNI/digoxin with CKD.  - Spironolactone stopped when creatinine rose.  - Continue Toprol XL 25 mg daily.  - Cannot rule out progression of CAD with ischemic CMP though doubt ACS at 6/19 admission. Would not do coronary angiography at this point with high creatinine unless he has clear ACS.  - Would likely not be candidate for advanced therapies with his degree of CKD.  - He qualifies for ICD with persistently low EF but not CRT (do not think QRS is wide enough for him to benefit significantly). We talked about ICD again today.  He is still thinking about it, wants to discuss again at followup.  2. Atrial flutter: Atypical at 6/19 admission. He had DCCV with recurrence, then started on amiodarone and since then has been in NSR.  He was seen by EP, not thought to be a good ablation candidate.  - Stop amiodarone with pulmonary fibrosis on CT, concern for possible amiodarone lung toxicity.   - Continue eliquis 2.5 mg BID. 3. CKD stage 3: Following with Dr Hollie Salk now.  - BMET today.   4. CAD: s/p CABG 2000.  No cath since that time. See discussion above, will not cath unless he has significant symptoms.  No chest pain.   - Did not want to do cardiac rehab.  - Continue statin, check lipids today.  - No ASA given stable CAD with Eliquis use.  5. COPD: He has quit smoking since 5/19.  Not on oxygen.  6. 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  I am concerned that amiodarone could be playing a role.  - Check ESR and stop amiodarone for now.  - He has an appointment with pulmonary.   Loralie Champagne, MD 05/24/18

## 2018-05-24 NOTE — Patient Instructions (Addendum)
STOP Amiodarone  INCREASE Lasix to 60mg  (1.5 tabs) daily  Labs today We will only contact you if something comes back abnormal or we need to make some changes. Otherwise no news is good news!  Your physician recommends that you schedule a follow-up appointment in: 3 months with Dr. Shirlee Latch  Do the following things EVERYDAY: 1) Weigh yourself in the morning before breakfast. Write it down and keep it in a log. 2) Take your medicines as prescribed 3) Eat low salt foods-Limit salt (sodium) to 2000 mg per day.  4) Stay as active as you can everyday 5) Limit all fluids for the day to less than 2 liters

## 2018-05-25 LAB — PTH, INTACT AND CALCIUM
CALCIUM TOTAL (PTH): 9.2 mg/dL (ref 8.6–10.2)
PTH: 56 pg/mL (ref 15–65)

## 2018-05-26 ENCOUNTER — Other Ambulatory Visit: Payer: Self-pay | Admitting: Cardiology

## 2018-05-27 ENCOUNTER — Telehealth (HOSPITAL_COMMUNITY): Payer: Self-pay

## 2018-05-27 NOTE — Telephone Encounter (Signed)
Spoke with patient, aware of lab results. Pt has lab appointment on 3/23. Per patient, his kidney doctor is Dr Signe Colt, labs forwarded.

## 2018-05-27 NOTE — Telephone Encounter (Signed)
-----   Message from Laurey Morale, MD sent at 05/27/2018 12:04 AM EDT ----- Send to his renal MD.

## 2018-05-30 ENCOUNTER — Other Ambulatory Visit: Payer: Self-pay

## 2018-05-30 MED ORDER — ATORVASTATIN CALCIUM 40 MG PO TABS: 40.0000 mg | ORAL_TABLET | Freq: Every day | ORAL | 1 refills | Status: DC

## 2018-05-31 ENCOUNTER — Encounter: Payer: Self-pay | Admitting: Emergency Medicine

## 2018-05-31 ENCOUNTER — Other Ambulatory Visit: Payer: Self-pay

## 2018-05-31 ENCOUNTER — Ambulatory Visit (INDEPENDENT_AMBULATORY_CARE_PROVIDER_SITE_OTHER): Payer: Medicare Other | Admitting: Emergency Medicine

## 2018-05-31 VITALS — BP 118/60 | HR 85 | Ht 62.0 in | Wt 101.0 lb

## 2018-05-31 DIAGNOSIS — R0602 Shortness of breath: Secondary | ICD-10-CM

## 2018-05-31 DIAGNOSIS — J849 Interstitial pulmonary disease, unspecified: Secondary | ICD-10-CM | POA: Insufficient documentation

## 2018-05-31 DIAGNOSIS — R0600 Dyspnea, unspecified: Secondary | ICD-10-CM | POA: Insufficient documentation

## 2018-05-31 MED ORDER — TIOTROPIUM BROMIDE MONOHYDRATE 2.5 MCG/ACT IN AERS: 2.0000 | INHALATION_SPRAY | Freq: Every day | RESPIRATORY_TRACT | 0 refills | Status: DC

## 2018-05-31 NOTE — Progress Notes (Signed)
Subjective:    Patient ID: Derrick Marsh, male    DOB: 05-Jun-1945, 73 y.o.   MRN: [id number]  HPI Derrick Marsh he is 73, former smoker (100+pack years) with a history of CAD/CABG, PVD, hypertension, atrial fib/flutter, systolic CHF with left and right ventricular dysfunction (EF 20 to 25%), secondary pulmonary hypertension.  Also with chronic renal insufficiency stage III.  He was admitted for an acute CHF exacerbation in the setting of A. fib/flutter in June 2019, required milrinone for diuresis, was going to be cardioverted but then spontaneously went back into sinus rhythm.  He was discharged on anticoagulation and amiodarone.  Ablation was deferred.  He had pulmonary function testing 04/04/2018 that I reviewed, show principally restriction, possibly some superimposed mild obstruction based on curve of his flow volume loop.  The lung volumes confirm restriction. He has markedly decreased diffusion capacity.  Multiple CTs were reviewed.  CT chest done 05/12/2018 shows evolving interstitial peripheral groundglass transitioning into UIP pattern superimposed on some emphysematous change.  There is also some mediastinal lymphadenopathy present, scattered calcified granulomas.  This compared to a CT chest from 08/27/2017 that shows some more nodular infiltrative focal process was some associated patchy groundglass, again superimposed on emphysematous change but without any significant peripheral honeycomb or UIP architecture.  His amiodarone was stopped after the testing above was completed. He was formerly on albuterol, spiriva, not for 20 yrs. He is on supplemental O2 since June, doesn't use it at all times (he did do so post-hospitalization). He denies any hx RA, auto-immune disease.    Review of Systems  Constitutional: Negative for fever and unexpected weight change.  HENT: Negative for congestion, dental problem, ear pain, nosebleeds, postnasal drip, rhinorrhea, sinus pressure, sneezing, sore  throat and trouble swallowing.   Eyes: Negative for redness and itching.  Respiratory: Positive for shortness of breath. Negative for cough, chest tightness and wheezing.   Cardiovascular: Negative for palpitations and leg swelling.  Gastrointestinal: Negative for nausea and vomiting.  Genitourinary: Negative for dysuria.  Musculoskeletal: Negative for joint swelling.  Skin: Negative for rash.  Neurological: Negative for headaches.  Hematological: Does not bruise/bleed easily.  Psychiatric/Behavioral: Negative for dysphoric mood. The patient is not nervous/anxious.     Past Medical History:  Diagnosis Date   Allergic rhinitis    Atherosclerosis of native arteries of the extremities with intermittent claudication    CAD (coronary artery disease) 2000   s/p acute IWMI with vfib arrest s/p CABG with LIMA to LAD, SVG to OM, SVG to RCA   HTN (hypertension)    Hypercholesteremia    PVC's (premature ventricular contractions)    Tobacco abuse      Family History  Problem Relation Age of Onset   Heart disease Father    Emphysema Father    CVA Father    Heart failure Mother      Social History   Socioeconomic History   Marital status: Single    Spouse name: Not on file   Number of children: Not on file   Years of education: Not on file   Highest education level: Not on file  Occupational History   Not on file  Social Needs   Financial resource strain: Not on file   Food insecurity:    Worry: Not on file    Inability: Not on file   Transportation needs:    Medical: Not on file    Non-medical: Not on file  Tobacco Use   Smoking status: Former  Smoker    Packs/day: 0.50    Types: Cigars    Last attempt to quit: 07/14/2017    Years since quitting: 0.8   Smokeless tobacco: Never Used  Substance and Sexual Activity   Alcohol use: No   Drug use: No   Sexual activity: Not on file  Lifestyle   Physical activity:    Days per week: Not on file     Minutes per session: Not on file   Stress: Not on file  Relationships   Social connections:    Talks on phone: Not on file    Gets together: Not on file    Attends religious service: Not on file    Active member of club or organization: Not on file    Attends meetings of clubs or organizations: Not on file    Relationship status: Not on file   Intimate partner violence:    Fear of current or ex partner: Not on file    Emotionally abused: Not on file    Physically abused: Not on file    Forced sexual activity: Not on file  Other Topics Concern   Not on file  Social History Narrative   Not on file  he has worked as a Chartered certified accountant, Psychologist, occupational, pipe fitter ? Possible asbestos exposure [place] native No military  Allergies  Allergen Reactions   Aleve [Naproxen] Other (See Comments)    Was told by a MD to not take this; conflicted with his other meds     Outpatient Medications Prior to Visit  Medication Sig Dispense Refill   apixaban (ELIQUIS) 2.5 MG TABS tablet Take 1 tablet (2.5 mg total) by mouth 2 (two) times daily. 60 tablet 6   atorvastatin (LIPITOR) 40 MG tablet Take 1 tablet (40 mg total) by mouth daily. 90 tablet 1   colchicine 0.6 MG tablet Take 0.6 mg by mouth daily as needed (for gout flares).      furosemide (LASIX) 40 MG tablet Take 1.5 tablets (60 mg total) by mouth daily. 45 tablet 6   metoprolol succinate (TOPROL-XL) 25 MG 24 hr tablet Take 1 tablet (25 mg total) by mouth daily. Take with or immediately following a meal. 90 tablet 3   No facility-administered medications prior to visit.         Objective:   Physical Exam Vitals:   05/31/18 1428  BP: 118/60  Pulse: 85  SpO2: 94%  Weight: 101 lb (45.8 kg)  Height: 5\' 2"  (1.575 m)   Gen: Pleasant, thin elderly man, in no distress,  normal affect  ENT: No lesions,  mouth clear,  oropharynx clear, no postnasal drip  Neck: No JVD, no stridor  Lungs: No use of accessory muscles, bibasilar insp fine  crackles, no wheeze on forced expiration  Cardiovascular: RRR, heart sounds normal, no murmur or gallops, no peripheral edema  Musculoskeletal: No deformities, no cyanosis or clubbing  Neuro: alert, awake, non focal  Skin: Warm, no lesions or rash, many tattoos      Assessment & Plan:  ILD (interstitial lung disease) (HCC) Comparing his CT scans of the chest shows that he has a patchy inflammatory process with some groundglass and consolidative components, peripheral distribution that goes back as far as his June hospitalization.  On the new scan there appears to be some changing in findings to more of a UIP pattern.  He was placed on amiodarone during that hospitalization so some of the CT abnormalities predate that medication.  All the same I  agree with stopping it as has been done.  He needs an autoimmune evaluation and then possible biopsy depending on whether we believe he can tolerate it.  Could consider empiric steroids but at this point am not sure what I am treating.  I had like for him to be followed in the ILD clinic.  I will start the work-up now.  Dyspnea Suspect that this is multifactorial, certainly his underlying heart disease is at least a factor, but ILD is a contributor as well.  Given the slight curve on his spirometry flow volume loop and his longstanding tobacco use consider a component of obstructive disease as well.  Unclear how much this is contributing. I think it would be reasonable to put him back on a long-acting bronchodilator to see if he gets overall clinical benefit.  We can do this while we are working up the ILD.  Discussed with him next visit whether he has gotten any benefit from Spiriva.  Continue his supplemental oxygen.  Probably repeat his walking oximetry, 6 minute walk distance when he follows up in ILD clinic.  Levy Pupa, MD, PhD 05/31/2018, 5:08 PM  [facility] [phone] or if no answer [phone]

## 2018-05-31 NOTE — Assessment & Plan Note (Signed)
Comparing his CT scans of the chest shows that he has a patchy inflammatory process with some groundglass and consolidative components, peripheral distribution that goes back as far as his June hospitalization.  On the new scan there appears to be some changing in findings to more of a UIP pattern.  He was placed on amiodarone during that hospitalization so some of the CT abnormalities predate that medication.  All the same I agree with stopping it as has been done.  He needs an autoimmune evaluation and then possible biopsy depending on whether we believe he can tolerate it.  Could consider empiric steroids but at this point am not sure what I am treating.  I had like for him to be followed in the ILD clinic.  I will start the work-up now.

## 2018-05-31 NOTE — Assessment & Plan Note (Signed)
Suspect that this is multifactorial, certainly his underlying heart disease is at least a factor, but ILD is a contributor as well.  Given the slight curve on his spirometry flow volume loop and his longstanding tobacco use consider a component of obstructive disease as well.  Unclear how much this is contributing. I think it would be reasonable to put him back on a long-acting bronchodilator to see if he gets overall clinical benefit.  We can do this while we are working up the ILD.  Discussed with him next visit whether he has gotten any benefit from Spiriva.  Continue his supplemental oxygen.  Probably repeat his walking oximetry, 6 minute walk distance when he follows up in ILD clinic.

## 2018-05-31 NOTE — Patient Instructions (Signed)
We will perform lab work today. Agree with being off of amiodarone Keep your oxygen available use 2 L/min with heavy exertion. Follow-up in the interstitial lung disease clinic next available after your blood work to discuss results and plan next steps.

## 2018-06-01 ENCOUNTER — Institutional Professional Consult (permissible substitution): Payer: Medicare Other | Admitting: Internal Medicine

## 2018-06-01 LAB — ANTI-SMITH ANTIBODY: ENA SM Ab Ser-aCnc: <1 AI

## 2018-06-01 LAB — ANTI-JO 1 ANTIBODY, IGG: Anti JO-1: <0.2 AI (ref 0.0–0.9)

## 2018-06-03 LAB — SJOGREN'S SYNDROME ANTIBODS(SSA + SSB)
SSA (Ro) (ENA) Antibody, IgG: <1 AI
SSB (La) (ENA) Antibody, IgG: <1 AI

## 2018-06-03 LAB — ANTI-SCLERODERMA ANTIBODY: Scleroderma (Scl-70) (ENA) Antibody, IgG: <1 AI

## 2018-06-03 LAB — CYCLIC CITRUL PEPTIDE ANTIBODY, IGG: Cyclic Citrullin Peptide Ab: <16 UNITS

## 2018-06-03 LAB — RNP ANTIBODY: Ribonucleic Protein(ENA) Antibody, IgG: <1 AI

## 2018-06-03 LAB — ANTI-DNA ANTIBODY, DOUBLE-STRANDED

## 2018-06-03 LAB — ANA: Anti Nuclear Antibody (ANA): NEGATIVE

## 2018-06-03 LAB — ANGIOTENSIN CONVERTING ENZYME: ANGIOTENSIN-CONVERTING ENZYME: 35 U/L (ref 9–67)

## 2018-06-03 LAB — RHEUMATOID FACTOR: Rheumatoid fact SerPl-aCnc: <14 IU/mL (ref <14)

## 2018-06-03 LAB — ALDOLASE: Aldolase: 6.7 U/L (ref <=8.1)

## 2018-06-06 ENCOUNTER — Ambulatory Visit (HOSPITAL_COMMUNITY)
Admission: RE | Admit: 2018-06-06 | Discharge: 2018-06-06 | Disposition: A | Discharge status: Home / Self Care | Payer: Medicare Other | Source: Ambulatory Visit | Attending: Cardiology | Admitting: Cardiology

## 2018-06-06 ENCOUNTER — Other Ambulatory Visit: Payer: Self-pay

## 2018-06-06 DIAGNOSIS — I5042 Chronic combined systolic (congestive) and diastolic (congestive) heart failure: Secondary | ICD-10-CM | POA: Diagnosis not present

## 2018-06-06 LAB — BASIC METABOLIC PANEL
Anion gap: 10 (ref 5–15)
BUN: 27 mg/dL — AB (ref 8–23)
CO2: 26 mmol/L (ref 22–32)
Calcium: 9.4 mg/dL (ref 8.9–10.3)
Chloride: 100 mmol/L (ref 98–111)
Creatinine, Ser: 2.58 mg/dL — ABNORMAL HIGH (ref 0.61–1.24)
GFR calc Af Amer: 27 mL/min — ABNORMAL LOW (ref >60)
GFR calc non Af Amer: 24 mL/min — ABNORMAL LOW (ref >60)
Glucose, Bld: 169 mg/dL — ABNORMAL HIGH (ref 70–99)
Potassium: 3.5 mmol/L (ref 3.5–5.1)
Sodium: 136 mmol/L (ref 135–145)

## 2018-06-07 ENCOUNTER — Telehealth: Payer: Self-pay | Admitting: Emergency Medicine

## 2018-06-07 NOTE — Telephone Encounter (Signed)
Called and spoke with patient he is aware of results and verbalized understanding. Nothing further needed.     Notes recorded by Leslye Peer, MD on 06/06/2018 at 5:41 PM EDT Please let the patient know that his lab work is normal. No evidence for systemic inflammatory disease

## 2018-08-18 ENCOUNTER — Telehealth (HOSPITAL_COMMUNITY): Payer: Self-pay | Admitting: Cardiology

## 2018-08-18 NOTE — Telephone Encounter (Signed)
Patient called back and confirmed receipt of message.  Pt agreeable to being added to waitlist

## 2018-08-18 NOTE — Telephone Encounter (Signed)
Called patient and left message advising d/t Covid restrictions at hosp we are cancelling his 08/25/2018 appt with Dr. Shirlee Latch and that he has been added to waitlist to r/s once restrictions lifted.  Advised pt if he has any problems to call the Clinic and choose option 2 to leave a message for Nurses.

## 2018-08-25 ENCOUNTER — Encounter (HOSPITAL_COMMUNITY): Payer: Medicare Other | Admitting: Cardiology

## 2018-10-10 ENCOUNTER — Ambulatory Visit (INDEPENDENT_AMBULATORY_CARE_PROVIDER_SITE_OTHER): Payer: Medicare Other | Admitting: Cardiology

## 2018-10-10 ENCOUNTER — Other Ambulatory Visit: Payer: Self-pay

## 2018-10-10 ENCOUNTER — Encounter: Payer: Self-pay | Admitting: Cardiology

## 2018-10-10 VITALS — BP 106/68 | HR 62 | Ht 62.0 in | Wt 95.4 lb

## 2018-10-10 DIAGNOSIS — I4892 Unspecified atrial flutter: Secondary | ICD-10-CM | POA: Diagnosis not present

## 2018-10-10 DIAGNOSIS — I10 Essential (primary) hypertension: Secondary | ICD-10-CM | POA: Diagnosis not present

## 2018-10-10 DIAGNOSIS — E785 Hyperlipidemia, unspecified: Secondary | ICD-10-CM

## 2018-10-10 DIAGNOSIS — I2510 Atherosclerotic heart disease of native coronary artery without angina pectoris: Secondary | ICD-10-CM | POA: Diagnosis not present

## 2018-10-10 DIAGNOSIS — I5042 Chronic combined systolic (congestive) and diastolic (congestive) heart failure: Secondary | ICD-10-CM | POA: Diagnosis not present

## 2018-10-10 NOTE — Progress Notes (Signed)
Cardiology Office Note:    Date:  10/11/2018   ID:  Derrick Marsh, DOB [birth year], MRN [id number]  PCP:  Seward Carol, MD  Cardiologist:  No primary care provider on file.    Referring MD: Seward Carol, MD   Chief Complaint  Patient presents with  . Coronary Artery Disease  . Hypertension  . Hyperlipidemia    History of Present Illness:    Derrick Marsh is a 73 y.o. male with a hx of hypertension, hyperlipidemia, COPD, gout, CAD, CABGin 2000, PAD, tobacco abuse, CKD stage 3,andPVCs.  Admitted 6/14-6/25/19 with atypical atrial flutter and acute systolic HF. Echo showed EF 20-25% with moderate RV dysfunction. He was diuresed with IV lasix. He had AKI on CKD stage 3 with creatinine peak of 3.17. Creatinine improved with holding diuresis. He required milrinone for low output HF. Underwent successful TEE/DCCV, but procedure complicated by hypotension requiring pressors and respiratory distress requiring intubation. He converted back into atrial flutter the following day, but then converted to NSR with amiodarone. EP did not think he was a good ablation candidate. Transitioned to po amiodarone and Eliquis prior to discharge. He was also treated for PNA. He was discharged with home O2. DC weight: 106 lbs.   Most recent echo in 10/19 showed EF 20-25%, mild LV dilation, PASP 48 mmHg.   Seen 03/18/2018 for followup in HF clinic. Weight was  down 4lbs and was doing well symptomatically and in NSR .  Toprol was increased to 25 mg daily.  It was recommended no coronary angiography be done due to his chronic kidney disease unless he has clear symptoms of ACS.  He is not a candidate for advanced therapies due to chronic kidney disease.  Dr. Aundra Dubin discussed the possibility of ICD but not CRT as his QRS was not widened off.  The patient did not want consider at that office visit and wanted to think about it.  Also did not want any cardiac rehab.  He is here today for followup and is doing  well.  He denies any chest pain or pressure, PND, orthopnea, LE edema, dizziness, palpitations or syncope. He has NYHA Class 2b CHF and has chronic DOE that is stable. He is compliant with his meds and is tolerating meds with no SE.     Past Medical History:  Diagnosis Date  . Allergic rhinitis   . Atherosclerosis of native arteries of the extremities with intermittent claudication   . 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 HTN (hypertension)   . Hypercholesteremia   . PVC's (premature ventricular contractions)   . Tobacco abuse     Past Surgical History:  Procedure Laterality Date  . CARDIOVERSION N/A 08/30/2017   Procedure: CARDIOVERSION;  Surgeon: Pixie Casino, MD;  Location: [facility] ENDOSCOPY;  Service: Cardiovascular;  Laterality: N/A;  . CORONARY ARTERY BYPASS GRAFT  2000  . TEE WITHOUT CARDIOVERSION N/A 08/30/2017   Procedure: TRANSESOPHAGEAL ECHOCARDIOGRAM (TEE);  Surgeon: Pixie Casino, MD;  Location: [facility] ENDOSCOPY;  Service: Cardiovascular;  Laterality: N/A;    Current Medications: Current Meds  Medication Sig  . apixaban (ELIQUIS) 2.5 MG TABS tablet Take 1 tablet (2.5 mg total) by mouth 2 (two) times daily.  Marland Kitchen atorvastatin (LIPITOR) 40 MG tablet Take 1 tablet (40 mg total) by mouth daily.  . colchicine 0.6 MG tablet Take 0.6 mg by mouth daily as needed (  for gout flares).   . furosemide (LASIX) 40 MG tablet Take 1.5 tablets (60 mg total) by mouth daily.  . metoprolol succinate (TOPROL-XL) 25 MG 24 hr tablet Take 1 tablet (25 mg total) by mouth daily. Take with or immediately following a meal.     Allergies:   Aleve [naproxen]   Social History   Socioeconomic History  . Marital status: Single    Spouse name: Not on file  . Number of children: Not on file  . Years of education: Not on file  . Highest education level: Not on file  Occupational History  . Not on file  Social Needs  .  Financial resource strain: Not on file  . Food insecurity    Worry: Not on file    Inability: Not on file  . Transportation needs    Medical: Not on file    Non-medical: Not on file  Tobacco Use  . Smoking status: Former Smoker    Packs/day: 0.50    Types: Cigars    Quit date: 07/14/2017    Years since quitting: 1.2  . Smokeless tobacco: Never Used  Substance and Sexual Activity  . Alcohol use: No  . Drug use: No  . Sexual activity: Not on file  Lifestyle  . Physical activity    Days per week: Not on file    Minutes per session: Not on file  . Stress: Not on file  Relationships  . Social Musicianconnections    Talks on phone: Not on file    Gets together: Not on file    Attends religious service: Not on file    Active member of club or organization: Not on file    Attends meetings of clubs or organizations: Not on file    Relationship status: Not on file  Other Topics Concern  . Not on file  Social History Narrative  . Not on file     Family History: The patient's family history includes CVA in his father; Emphysema in his father; Heart disease in his father; Heart failure in his mother.  ROS:   Please see the history of present illness.    ROS  All other systems reviewed and negative.   EKGs/Labs/Other Studies Reviewed:    The following studies were reviewed today: none  EKG:  EKG is not ordered today.  Recent Labs: 03/18/2018: TSH 2.463 05/24/2018: ALT 31; Hemoglobin 11.0; Platelets 391 06/06/2018: BUN 27; Creatinine, Ser 2.58; Potassium 3.5; Sodium 136   Recent Lipid Panel    Component Value Date/Time   CHOL 172 04/08/2018 1108   TRIG 95 04/08/2018 1108   HDL 56 04/08/2018 1108   CHOLHDL 3.1 04/08/2018 1108   CHOLHDL 3.7 08/28/2017 0520   VLDL 8 08/28/2017 0520   LDLCALC 97 04/08/2018 1108    Physical Exam:    VS:  BP 106/68   Pulse 62   Ht 5\' 2"  (1.575 m)   Wt 95 lb 6.4 oz (43.3 kg)   SpO2 97%   BMI 17.45 kg/m     Wt Readings from Last 3  Encounters:  10/10/18 95 lb 6.4 oz (43.3 kg)  05/31/18 101 lb (45.8 kg)  05/24/18 103 lb (46.7 kg)     GEN:  Well nourished, well developed in no acute distress HEENT: Normal NECK: No JVD; No carotid bruits LYMPHATICS: No lymphadenopathy CARDIAC: RRR, no murmurs, rubs, gallops RESPIRATORY:  Clear to auscultation without rales, wheezing or rhonchi  ABDOMEN: Soft, non-tender, non-distended MUSCULOSKELETAL:  No edema;  No deformity  SKIN: Warm and dry NEUROLOGIC:  Alert and oriented x 3 PSYCHIATRIC:  Normal affect   ASSESSMENT:    1. CAD, multiple vessel -s/p CABG   2. Essential hypertension   3. Paroxysmal atrial flutter (HCC)   4. Chronic combined systolic and diastolic heart failure (HCC)   5. Hyperlipidemia with target LDL less than 70    PLAN:    In order of problems listed above:  1.  ASCAD -s/p remote CABG in 2000 -Denies any CP -now with DCM  -no cath due to advanced CKD -continue medical therapy with statin and BB. -no ASA due to DOAC  2.  Hypertension -BP is well controlled -continue on Toprol XL 25mg  daily   3.  Paroxysmal atrial flutter -maintaining NSR -felt not to be a good candidate for ablation per EP -Amio stopped due to concern for pulmonary toxicity as CT chest showed emphysema and possible UIP. -continue Eliquis 2.5mg  BID (dosed for creatinine > 1.5 and weight < 60kg).  -his last creatinine was 2.58  4.  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  -no ACEI/ARB/ARNI/Dig due to CKD -cannot rule out progression of CAD with ischemic DCM but given CKD and lack of sx would not proceed with cath -he is a candidate for ICD but not CRT.  Dr. Shirlee LatchMcLean talked with him about this but he wanted to think about it -I discussed ICD again with him today and recommended that we set him up with  EP to talk to him about the procedure - he wants to talk with Dr. Shirlee LatchMcLean again about it before making an appt with EP.  5.  Hyperlipidemia -LDL goal is < 70 -his last LDL was 97 -he was supposed to increase lipitor to 80mg  daily but did not so I have instructed him to increase his dose and I will check an FLp and ALT in 6 weeks   Medication Adjustments/Labs and Tests Ordered: Current medicines are reviewed at length with the patient today.  Concerns regarding medicines are outlined above.  No orders of the defined types were placed in this encounter.  No orders of the defined types were placed in this encounter.   Signed, Armanda Magicraci Kartik Fernando, MD  10/11/2018 8:38 AM    [facility]

## 2018-10-10 NOTE — Patient Instructions (Signed)
Medication Instructions:  Your provider recommends that you continue on your current medications as directed. Please refer to the Current Medication list given to you today.    Labwork: None  Testing/Procedures: None  Follow-Up: You have an appointment with Dr. Delfina Redwood on Wednesday, 10/12/2018 at 8:30AM. Please arrive by 8:15AM for the screening process prior to your appointment. Please wear a mask.  Your provider wants you to follow-up in: 6 months with Dr. Radford Pax. You will receive a reminder letter in the mail two months in advance. If you don't receive a letter, please call our office to schedule the follow-up appointment.

## 2018-10-12 ENCOUNTER — Telehealth: Payer: Self-pay

## 2018-10-12 DIAGNOSIS — E785 Hyperlipidemia, unspecified: Secondary | ICD-10-CM

## 2018-10-12 MED ORDER — ATORVASTATIN CALCIUM 80 MG PO TABS: 80.0000 mg | ORAL_TABLET | Freq: Every day | ORAL | 3 refills | Status: DC

## 2018-10-12 NOTE — Telephone Encounter (Signed)
-----   Message from Sueanne Margarita, MD sent at 10/11/2018  8:38 AM EDT ----- Please increase lipitor to 80mg  daily and check FLP and ALT in 6 weeks

## 2018-10-12 NOTE — Telephone Encounter (Signed)
Instructed patient to INCREASE LIPITOR to 80 mg daily. FLP and ALT scheduled September 30. He was grateful for call and agrees with treatment plan.

## 2018-11-13 ENCOUNTER — Other Ambulatory Visit (HOSPITAL_COMMUNITY): Payer: Self-pay | Admitting: Cardiology

## 2018-11-20 ENCOUNTER — Other Ambulatory Visit: Payer: Self-pay | Admitting: Cardiology

## 2018-12-14 ENCOUNTER — Other Ambulatory Visit: Payer: Medicare Other | Admitting: *Deleted

## 2018-12-14 ENCOUNTER — Other Ambulatory Visit: Payer: Self-pay

## 2018-12-14 DIAGNOSIS — E785 Hyperlipidemia, unspecified: Secondary | ICD-10-CM

## 2018-12-14 LAB — LIPID PANEL
Chol/HDL Ratio: 2.5 ratio (ref 0.0–5.0)
Cholesterol, Total: 135 mg/dL (ref 100–199)
HDL: 54 mg/dL (ref >39)
LDL Chol Calc (NIH): 65 mg/dL (ref 0–99)
Triglycerides: 82 mg/dL (ref 0–149)
VLDL Cholesterol Cal: 16 mg/dL (ref 5–40)

## 2018-12-14 LAB — ALT: ALT: 10 IU/L (ref 0–44)

## 2019-03-05 ENCOUNTER — Other Ambulatory Visit (HOSPITAL_COMMUNITY): Payer: Self-pay | Admitting: Cardiology

## 2019-04-05 NOTE — Progress Notes (Signed)
Cardiology Office Note:    Date:  04/06/2019   ID:  Derrick Marsh, DOB 1945/06/08, MRN [id number]  PCP:  Renford Dills, MD  Cardiologist:  No primary care provider on file.    Referring MD: Renford Dills, MD   Chief Complaint  Patient presents with  . Coronary Artery Disease  . Hypertension  . Atrial Flutter  . Congestive Heart Failure  . Hyperlipidemia    History of Present Illness:    Derrick Marsh is a 74 y.o. male with a hx of hypertension, hyperlipidemia, COPD, gout, CAD, CABGin 2000, PAD, tobacco abuse, CKD stage 3,andPVCs.  Admitted 6/14-6/25/19 with atypical atrial flutter and acute systolic HF. Echo showed EF 20-25% with moderate RV dysfunction. He was diuresed with IV lasix. He had AKI on CKD stage 3 with creatinine peak of 3.17. Creatinine improved with holding diuresis. He required milrinone for low output HF. Underwent successful TEE/DCCV, but procedure complicated by hypotension requiring pressors and respiratory distress requiring intubation. He converted back into atrial flutter the following day, but then converted to NSR with amiodarone. EP did not think he was a good ablation candidate. Transitioned to po amiodarone and Eliquis prior to discharge. He was also treated for PNA. He was discharged with home O2. DC weight: 106 lbs.   Most recent echo in 10/19 showed EF 20-25%, mild LV dilation, PASP 48 mmHg.   Seen 03/18/2018 forfollowup in HF clinic. Weightwasdown 4lbsand wasdoing well symptomatically and inNSR .Toprol was increased to 25 mg daily. It was recommended no coronary angiography be done due to his chronic kidney disease unless he has clear symptoms of ACS. He is not a candidate for advanced therapies due to chronic kidney disease. Dr. Shirlee Latch discussed the possibility of ICD but not CRT as his QRS was not widened off. The patient did not want consider at that office visit and wanted to think about it. Also did not want any cardiac rehab.   He was seen back by me 10/11/2018 and was doing well.  He was still reluctant to consider ICD at that time.  He is here today for followup and is doing well.  He denies any chest pain or pressure, SOB, DOE, PND, orthopnea, LE edema, dizziness, palpitations or syncope. He is compliant with his meds and is tolerating meds with no SE.    Past Medical History:  Diagnosis Date  . Allergic rhinitis   . Atherosclerosis of native arteries of the extremities with intermittent claudication   . 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 HTN (hypertension)   . Hypercholesteremia   . PVC's (premature ventricular contractions)   . Tobacco abuse     Past Surgical History:  Procedure Laterality Date  . CARDIOVERSION N/A 08/30/2017   Procedure: CARDIOVERSION;  Surgeon: Chrystie Nose, MD;  Location: [facility] ENDOSCOPY;  Service: Cardiovascular;  Laterality: N/A;  . CORONARY ARTERY BYPASS GRAFT  2000  . TEE WITHOUT CARDIOVERSION N/A 08/30/2017   Procedure: TRANSESOPHAGEAL ECHOCARDIOGRAM (TEE);  Surgeon: Chrystie Nose, MD;  Location: [facility] ENDOSCOPY;  Service: Cardiovascular;  Laterality: N/A;    Current Medications: Current Meds  Medication Sig  . acetaminophen (TYLENOL) 500 MG tablet Take 500 mg by mouth every 6 (six) hours as needed for mild pain.  Marland Kitchen apixaban (ELIQUIS) 2.5 MG TABS tablet Take 1 tablet (2.5 mg total) by mouth 2 (two) times daily.  Marland Kitchen atorvastatin (  LIPITOR) 80 MG tablet Take 1 tablet (80 mg total) by mouth daily.  . colchicine 0.6 MG tablet Take 0.6 mg by mouth daily as needed (for gout flares).   . furosemide (LASIX) 40 MG tablet TAKE 1 & 1/2 TABLET EVERY DAY  . metoprolol succinate (TOPROL-XL) 25 MG 24 hr tablet TAKE 1 TABLET BY MOUTH EVERY DAY WITH OR IMMEDIATELY FOLLOWING A MEAL     Allergies:   Aleve [naproxen]   Social History   Socioeconomic History  . Marital status: Single    Spouse name: Not on  file  . Number of children: Not on file  . Years of education: Not on file  . Highest education level: Not on file  Occupational History  . Not on file  Tobacco Use  . Smoking status: Former Smoker    Packs/day: 0.50    Types: Cigars    Quit date: 07/14/2017    Years since quitting: 1.7  . Smokeless tobacco: Never Used  Substance and Sexual Activity  . Alcohol use: No  . Drug use: No  . Sexual activity: Not on file  Other Topics Concern  . Not on file  Social History Narrative  . Not on file   Social Determinants of Health   Financial Resource Strain:   . Difficulty of Paying Living Expenses: Not on file  Food Insecurity:   . Worried About Charity fundraiser in the Last Year: Not on file  . Ran Out of Food in the Last Year: Not on file  Transportation Needs:   . Lack of Transportation (Medical): Not on file  . Lack of Transportation (Non-Medical): Not on file  Physical Activity:   . Days of Exercise per Week: Not on file  . Minutes of Exercise per Session: Not on file  Stress:   . Feeling of Stress : Not on file  Social Connections:   . Frequency of Communication with Friends and Family: Not on file  . Frequency of Social Gatherings with Friends and Family: Not on file  . Attends Religious Services: Not on file  . Active Member of Clubs or Organizations: Not on file  . Attends Archivist Meetings: Not on file  . Marital Status: Not on file     Family History: The patient's family history includes CVA in his father; Emphysema in his father; Heart disease in his father; Heart failure in his mother.  ROS:   Please see the history of present illness.    ROS  All other systems reviewed and negative.   EKGs/Labs/Other Studies Reviewed:    The following studies were reviewed today: Office notes, labs  EKG:  EKG is  ordered today.  The ekg ordered today demonstrates NSR with nonspecific T wave abnormality  Recent Labs: 05/24/2018: Hemoglobin 11.0;  Platelets 391 06/06/2018: BUN 27; Creatinine, Ser 2.58; Potassium 3.5; Sodium 136 12/14/2018: ALT 10   Recent Lipid Panel    Component Value Date/Time   CHOL 135 12/14/2018 1033   TRIG 82 12/14/2018 1033   HDL 54 12/14/2018 1033   CHOLHDL 2.5 12/14/2018 1033   CHOLHDL 3.7 08/28/2017 0520   VLDL 8 08/28/2017 0520   LDLCALC 65 12/14/2018 1033    Physical Exam:    VS:  BP 116/60   Pulse 68   Ht 5\' 2"  (1.575 m)   Wt 105 lb 6.4 oz (47.8 kg)   BMI 19.28 kg/m     Wt Readings from Last 3 Encounters:  04/06/19 105 lb  6.4 oz (47.8 kg)  10/10/18 95 lb 6.4 oz (43.3 kg)  05/31/18 101 lb (45.8 kg)     GEN:  Well nourished, well developed in no acute distress HEENT: Normal NECK: No JVD; No carotid bruits LYMPHATICS: No lymphadenopathy CARDIAC: RRR, no murmurs, rubs, gallops RESPIRATORY:  Clear to auscultation without rales, wheezing or rhonchi  ABDOMEN: Soft, non-tender, non-distended MUSCULOSKELETAL:  No edema; No deformity  SKIN: Warm and dry NEUROLOGIC:  Alert and oriented x 3 PSYCHIATRIC:  Normal affect   ASSESSMENT:    1. CAD, multiple vessel -s/p CABG   2. Essential hypertension   3. Paroxysmal atrial flutter (HCC)   4. Chronic combined systolic and diastolic heart failure (HCC)   5. Hyperlipidemia with target LDL less than 70    PLAN:    In order of problems listed above:  1.  ASCAD -s/p remote CABG in 2000 -Denies any CP -now with DCM  -no cath due to advanced CKD -continue medical therapy with statin and BB. -no ASA due to DOAC  2.  Hypertension -BP is well controlled -continue on Toprol XL 25mg  daily   3.  Paroxysmal atrial flutter -maintaining NSR -felt not to be a good candidate for ablation per EP -Amio stopped due to concern for pulmonary toxicity as CT chest showed emphysema and possible UIP. -continue Eliquis 2.5mg  BID (dosed for creatinine > 1.5 and weight < 60kg).  -his last creatinine was 2.58  4.  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  -no ACEI/ARB/ARNI/Dig due to CKD -BP on soft side for Hydralazine or Imdur -cannot rule out progression of CAD with ischemic DCM but given CKD and lack of sx would not proceed with cath -he is a candidate for ICD but not CRT.  Dr. talked with him about this but he wanted to think about it.  He tells me he still wants to think about it.  We discussed that he is at risk of sudden cardiac death without it and he understands.   5.  Hyperlipidemia -LDL goal is < 70 -his last LDL was 97 -continue  lipitor to 80mg  daily  -check FLP and ALT    Medication Adjustments/Labs and Tests Ordered: Current medicines are reviewed at length with the patient today.  Concerns regarding medicines are outlined above.  Orders Placed This Encounter  Procedures  . EKG 12-Lead   No orders of the defined types were placed in this encounter.   Signed, , MD  04/06/2019 3:56 PM    [facility]

## 2019-04-06 ENCOUNTER — Encounter: Payer: Self-pay | Admitting: Cardiology

## 2019-04-06 ENCOUNTER — Other Ambulatory Visit: Payer: Self-pay

## 2019-04-06 ENCOUNTER — Ambulatory Visit: Payer: Medicare Other | Admitting: Cardiology

## 2019-04-06 VITALS — BP 116/60 | HR 68 | Ht 62.0 in | Wt 105.4 lb

## 2019-04-06 DIAGNOSIS — I5042 Chronic combined systolic (congestive) and diastolic (congestive) heart failure: Secondary | ICD-10-CM | POA: Diagnosis not present

## 2019-04-06 DIAGNOSIS — I4892 Unspecified atrial flutter: Secondary | ICD-10-CM | POA: Diagnosis not present

## 2019-04-06 DIAGNOSIS — I2510 Atherosclerotic heart disease of native coronary artery without angina pectoris: Secondary | ICD-10-CM

## 2019-04-06 DIAGNOSIS — I10 Essential (primary) hypertension: Secondary | ICD-10-CM | POA: Diagnosis not present

## 2019-04-06 DIAGNOSIS — E785 Hyperlipidemia, unspecified: Secondary | ICD-10-CM

## 2019-04-06 NOTE — Patient Instructions (Signed)
Medication Instructions:  Your physician recommends that you continue on your current medications as directed. Please refer to the Current Medication list given to you today.  *If you need a refill on your cardiac medications before your next appointment, please call your pharmacy*  Lab Work: TODAY: FLP and ALT If you have labs (blood work) drawn today and your tests are completely normal, you will receive your results only by: MyChart Message (if you have MyChart) OR A paper copy in the mail If you have any lab test that is abnormal or we need to change your treatment, we will call you to review the results.  Follow-Up: At [facility], you and your health needs are our priority.  As part of our continuing mission to provide you with exceptional heart care, we have created designated Provider Care Teams.  These Care Teams include your primary Cardiologist (physician) and Advanced Practice Providers (APPs -  Physician Assistants and Nurse Practitioners) who all work together to provide you with the care you need, when you need it.  Your next appointment:   1 year(s)  The format for your next appointment:   In Person  Provider:   You may see Traci Turner, MD or one of the following Advanced Practice Providers on your designated Care Team:   Dayna Dunn, PA-C Michele Lenze, PA-C   

## 2019-04-07 LAB — ALT: ALT: 11 IU/L (ref 0–44)

## 2019-04-07 LAB — LIPID PANEL
Chol/HDL Ratio: 2.5 ratio (ref 0.0–5.0)
Cholesterol, Total: 124 mg/dL (ref 100–199)
HDL: 50 mg/dL (ref >39)
LDL Chol Calc (NIH): 59 mg/dL (ref 0–99)
Triglycerides: 71 mg/dL (ref 0–149)
VLDL Cholesterol Cal: 15 mg/dL (ref 5–40)

## 2019-05-03 ENCOUNTER — Other Ambulatory Visit (HOSPITAL_COMMUNITY): Payer: Self-pay | Admitting: Cardiology

## 2019-08-05 ENCOUNTER — Other Ambulatory Visit (HOSPITAL_COMMUNITY): Payer: Self-pay | Admitting: Cardiology

## 2019-08-20 IMAGING — CT CT CHEST W/O CM
2 of 3 series · 15 of 36 positions shown, 18 images · non-contrast
Comparison: Plain films of earlier today.  No prior CT.

CLINICAL DATA: Shortness of breath for 2 months. Worsening over the
last 2 days. Tobacco abuse.

EXAM:
CT CHEST WITHOUT CONTRAST
TECHNIQUE: Multidetector CT imaging of the chest was performed following the
standard protocol without IV contrast.

[Series 3: chest w/o 2mm st · axial · non-contrast · 0.61mm/px · z∈[+996,+1276]mm · 12 of 166 slices shown, 15 images]
[im 13/166  mediastinal]
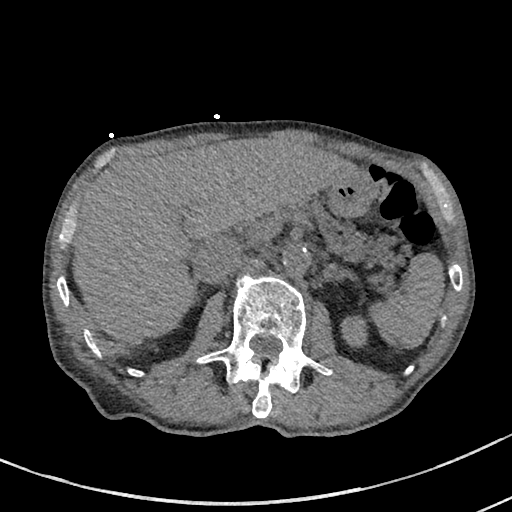
[im 13/166  lung]
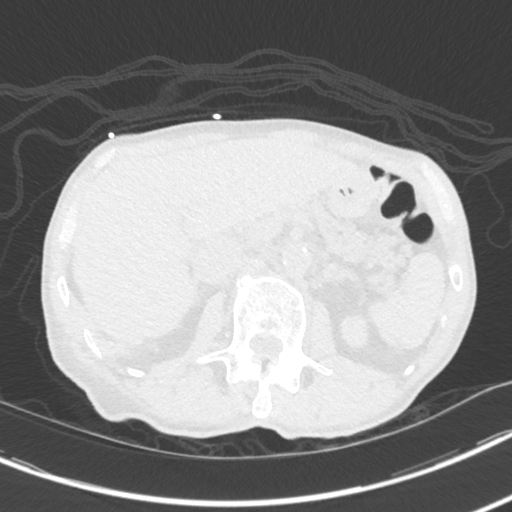
[im 25/166  lung]
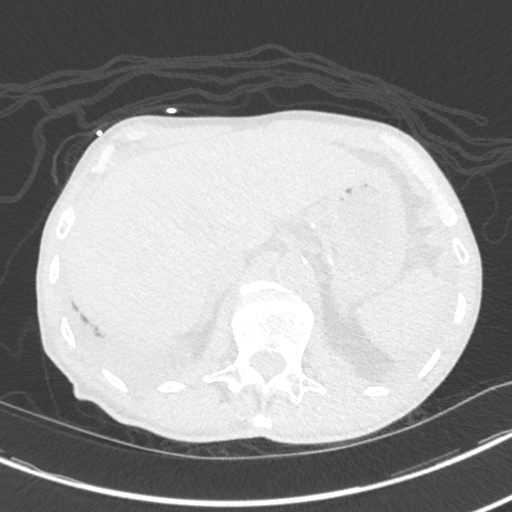
[im 37/166  lung]
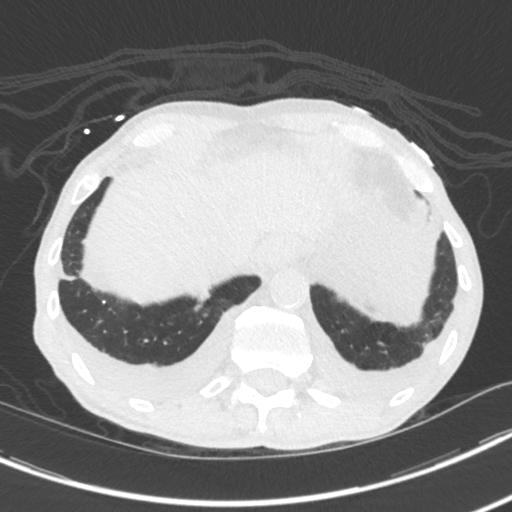
[im 49/166  lung]
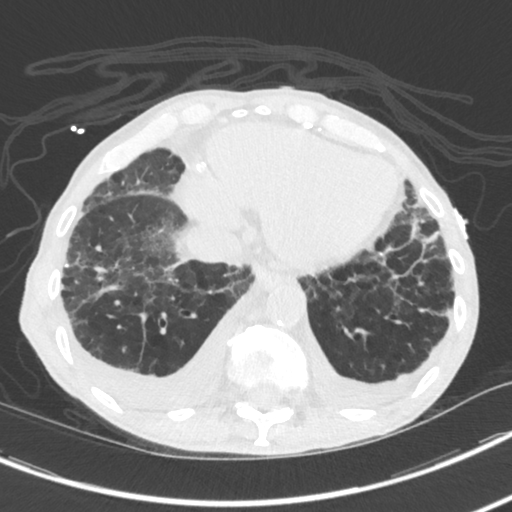
[im 62/166  mediastinal]
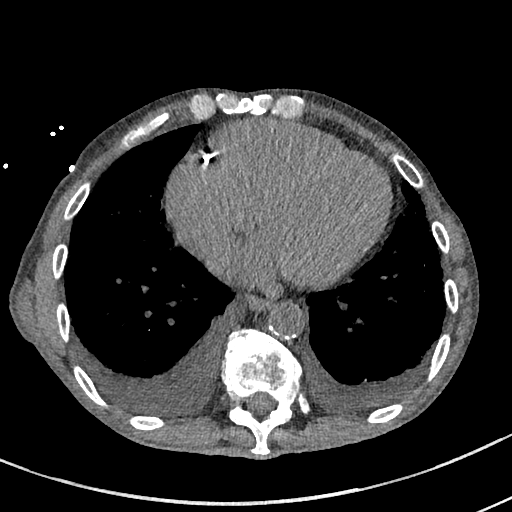
[im 62/166  lung]
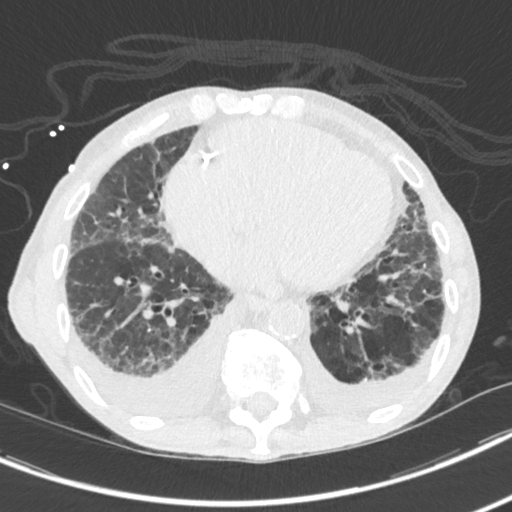
[im 74/166  lung]
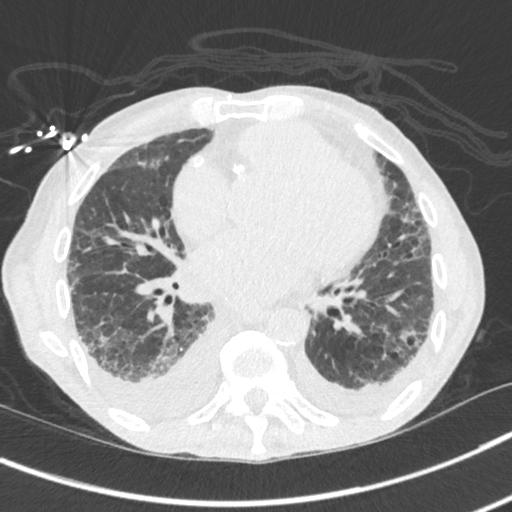
[im 92/166  lung]
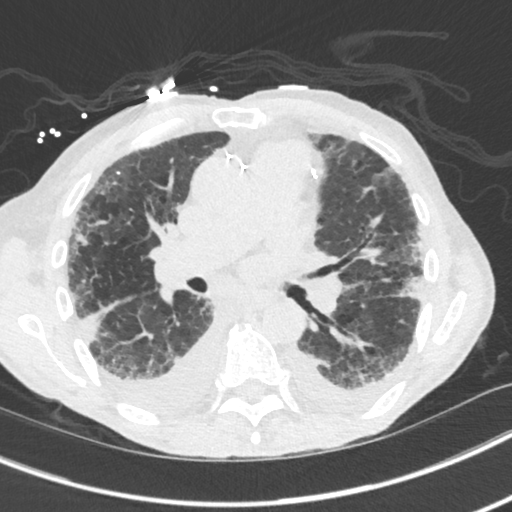
[im 104/166  lung]
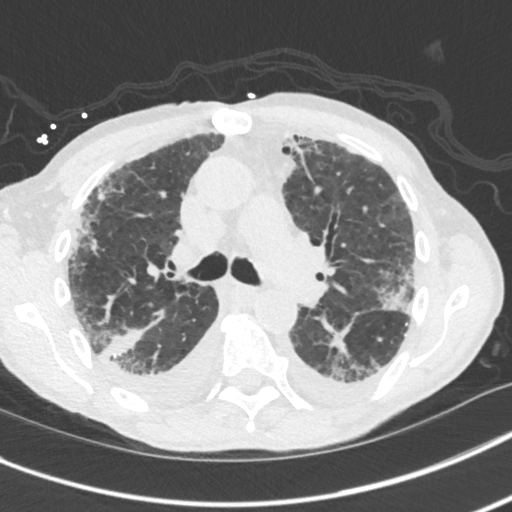
[im 117/166  mediastinal]
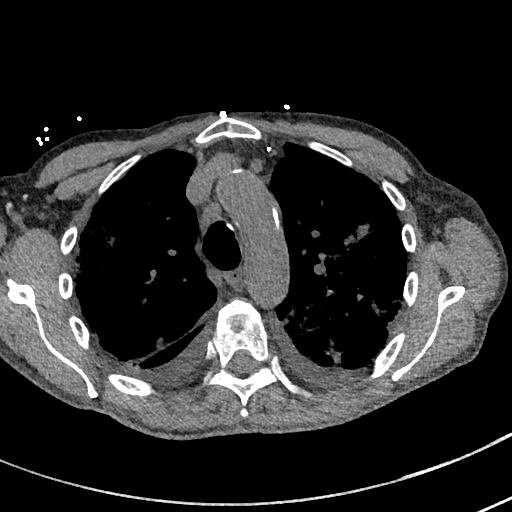
[im 117/166  lung]
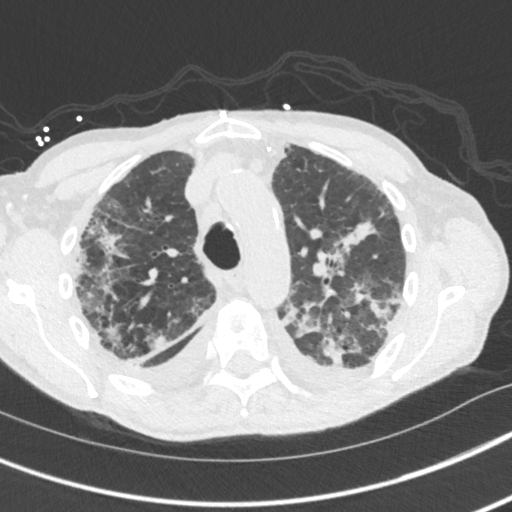
[im 129/166  lung]
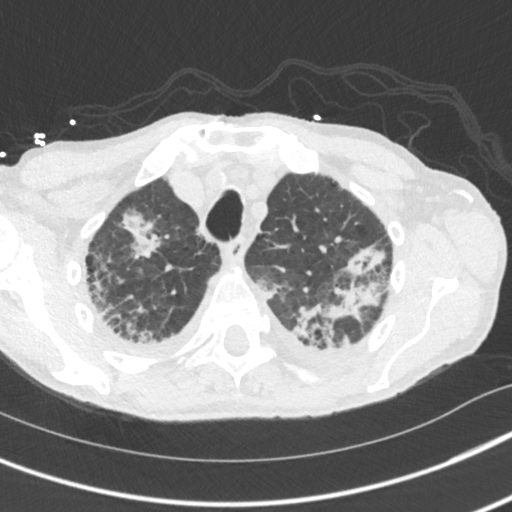
[im 141/166  lung]
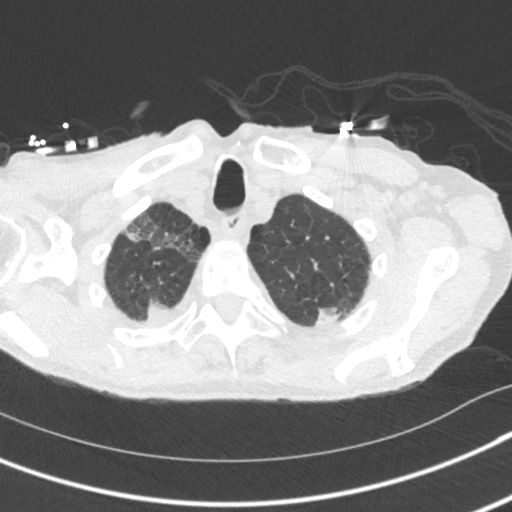
[im 153/166  lung]
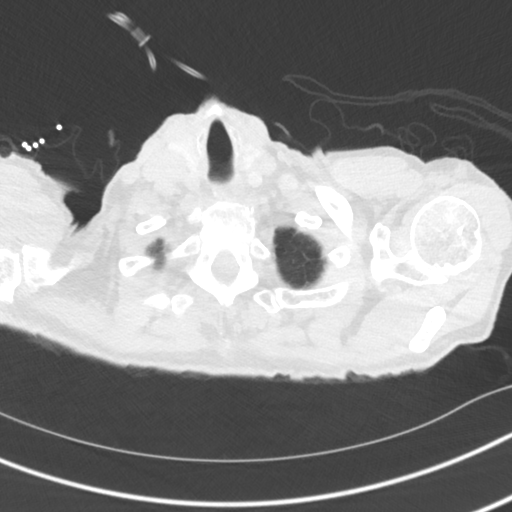

[Series 5: chest w/o 3mm st cor · coronal · non-contrast · 0.58mm/px · 3 of 74 slices shown]
[im 15/74  lung]
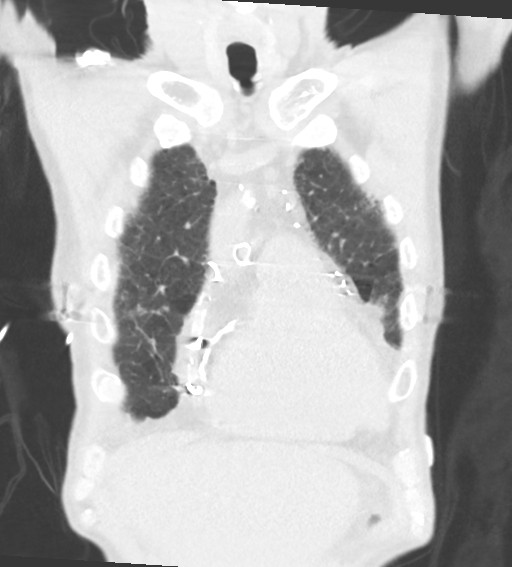
[im 30/74  lung]
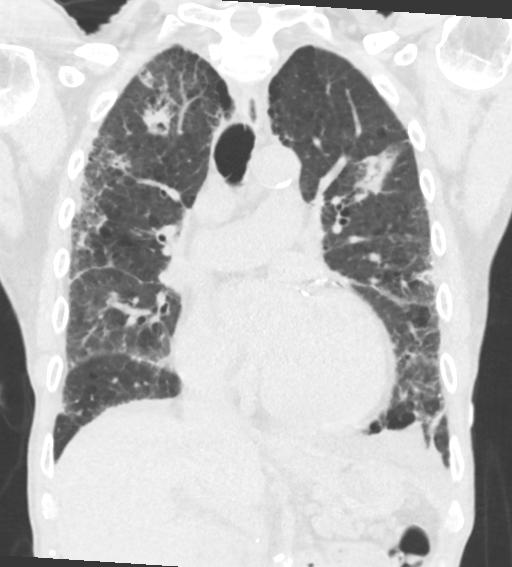
[im 44/74  lung]
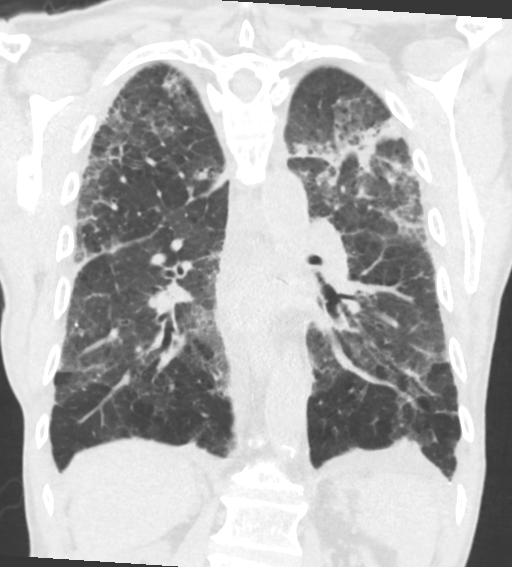

[15 of 36 positions shown; findings below may reference images not displayed]

FINDINGS: Cardiovascular: Aortic and branch vessel atherosclerosis. Moderate
cardiomegaly, without pericardial effusion. Median sternotomy for
CABG. Pulmonary artery enlargement, outflow tract 3.1 cm.

Mediastinum/Nodes: Right paratracheal node of 1.3 cm. Subcarinal
node measures 1.6 cm. Suspect bilateral hilar adenopathy.

Lungs/Pleura: Small bilateral pleural effusions. Advanced bullous
type emphysema. Upper lung and peripheral predominant areas of
patchy airspace and ground-glass opacity.

Suspect some areas of mild bronchiectasis related to architectural
distortion. These are basilar predominant.

Pulmonary nodules, including a 5 mm left upper lobe nodule on image
[DATE] and a 7 mm right lower lobe pulmonary nodule on image 99/4.

Upper Abdomen: Normal imaged portions of the liver, spleen, stomach,
pancreas, adrenal glands, right kidney. Marked left renal atrophy.
Abdominal aortic atherosclerosis.

Musculoskeletal: Moderate thoracic spondylosis. Mild to moderate T11
compression deformity.
IMPRESSION: 1. Airspace and ground-glass opacity which is bilateral and
peripheral predominant. Concurrent bilateral pleural effusions.
Favor pulmonary edema and superimposed infection. Inflammatory
etiologies such as organizing pneumonia felt less likely. Consider
antibiotic therapy and diuresis with subsequent plain film
radiographic follow-up.
2.  Emphysema ([id number]).
3.  Aortic Atherosclerosis ([id number]).
4. Pulmonary artery enlargement suggests pulmonary arterial
hypertension.
5. Bilateral pulmonary nodules of maximally 7 mm. Non-contrast chest
CT at 6-12 months is recommended. If the nodule is stable at time of
repeat CT, then future CT at 18-24 months (from today's scan) is
considered optional for low-risk patients, but is recommended for
high-risk patients. This recommendation follows the consensus
statement: Guidelines for Management of Incidental Pulmonary Nodules
Detected on CT Images: From the [HOSPITAL] 1138; Radiology
6. Thoracic adenopathy, favored to be reactive.

## 2019-09-27 ENCOUNTER — Other Ambulatory Visit: Payer: Self-pay | Admitting: Cardiology

## 2019-12-26 ENCOUNTER — Other Ambulatory Visit (HOSPITAL_COMMUNITY): Payer: Self-pay | Admitting: *Deleted

## 2019-12-26 MED ORDER — METOPROLOL SUCCINATE ER 25 MG PO TB24: ORAL_TABLET | ORAL | 3 refills | Status: DC

## 2019-12-26 MED ORDER — FUROSEMIDE 40 MG PO TABS: 60.0000 mg | ORAL_TABLET | Freq: Every day | ORAL | 2 refills | Status: DC

## 2019-12-26 MED ORDER — ATORVASTATIN CALCIUM 80 MG PO TABS: 80.0000 mg | ORAL_TABLET | Freq: Every day | ORAL | 1 refills | Status: DC

## 2019-12-27 ENCOUNTER — Other Ambulatory Visit (HOSPITAL_COMMUNITY): Payer: Self-pay | Admitting: Cardiology

## 2019-12-27 ENCOUNTER — Other Ambulatory Visit (HOSPITAL_COMMUNITY): Payer: Self-pay | Admitting: *Deleted

## 2020-01-24 ENCOUNTER — Other Ambulatory Visit (HOSPITAL_COMMUNITY): Payer: Self-pay | Admitting: Cardiology

## 2020-01-26 ENCOUNTER — Encounter (HOSPITAL_COMMUNITY): Payer: Self-pay | Admitting: Cardiology

## 2020-01-26 ENCOUNTER — Other Ambulatory Visit (HOSPITAL_COMMUNITY): Payer: Self-pay

## 2020-01-26 ENCOUNTER — Other Ambulatory Visit: Payer: Self-pay

## 2020-01-26 ENCOUNTER — Ambulatory Visit (HOSPITAL_COMMUNITY)
Admission: RE | Admit: 2020-01-26 | Discharge: 2020-01-26 | Disposition: A | Discharge status: Home / Self Care | Payer: Medicare Other | Source: Ambulatory Visit | Attending: Cardiology | Admitting: Cardiology

## 2020-01-26 VITALS — BP 104/60 | HR 89 | Wt 99.8 lb

## 2020-01-26 DIAGNOSIS — N1832 Chronic kidney disease, stage 3b: Secondary | ICD-10-CM

## 2020-01-26 DIAGNOSIS — Z87891 Personal history of nicotine dependence: Secondary | ICD-10-CM | POA: Insufficient documentation

## 2020-01-26 DIAGNOSIS — I739 Peripheral vascular disease, unspecified: Secondary | ICD-10-CM | POA: Insufficient documentation

## 2020-01-26 DIAGNOSIS — I484 Atypical atrial flutter: Secondary | ICD-10-CM | POA: Diagnosis not present

## 2020-01-26 DIAGNOSIS — N183 Chronic kidney disease, stage 3 unspecified: Secondary | ICD-10-CM | POA: Insufficient documentation

## 2020-01-26 DIAGNOSIS — Z951 Presence of aortocoronary bypass graft: Secondary | ICD-10-CM | POA: Insufficient documentation

## 2020-01-26 DIAGNOSIS — I130 Hypertensive heart and chronic kidney disease with heart failure and stage 1 through stage 4 chronic kidney disease, or unspecified chronic kidney disease: Secondary | ICD-10-CM | POA: Insufficient documentation

## 2020-01-26 DIAGNOSIS — I5022 Chronic systolic (congestive) heart failure: Secondary | ICD-10-CM | POA: Diagnosis not present

## 2020-01-26 DIAGNOSIS — J849 Interstitial pulmonary disease, unspecified: Secondary | ICD-10-CM | POA: Diagnosis not present

## 2020-01-26 DIAGNOSIS — Z886 Allergy status to analgesic agent status: Secondary | ICD-10-CM | POA: Insufficient documentation

## 2020-01-26 DIAGNOSIS — I2510 Atherosclerotic heart disease of native coronary artery without angina pectoris: Secondary | ICD-10-CM | POA: Insufficient documentation

## 2020-01-26 DIAGNOSIS — Z8249 Family history of ischemic heart disease and other diseases of the circulatory system: Secondary | ICD-10-CM | POA: Diagnosis not present

## 2020-01-26 DIAGNOSIS — I255 Ischemic cardiomyopathy: Secondary | ICD-10-CM | POA: Insufficient documentation

## 2020-01-26 DIAGNOSIS — I4892 Unspecified atrial flutter: Secondary | ICD-10-CM

## 2020-01-26 DIAGNOSIS — I5042 Chronic combined systolic (congestive) and diastolic (congestive) heart failure: Secondary | ICD-10-CM | POA: Diagnosis not present

## 2020-01-26 DIAGNOSIS — M109 Gout, unspecified: Secondary | ICD-10-CM | POA: Diagnosis not present

## 2020-01-26 DIAGNOSIS — J8410 Pulmonary fibrosis, unspecified: Secondary | ICD-10-CM | POA: Diagnosis not present

## 2020-01-26 DIAGNOSIS — I252 Old myocardial infarction: Secondary | ICD-10-CM | POA: Insufficient documentation

## 2020-01-26 DIAGNOSIS — J449 Chronic obstructive pulmonary disease, unspecified: Secondary | ICD-10-CM | POA: Insufficient documentation

## 2020-01-26 DIAGNOSIS — Z7901 Long term (current) use of anticoagulants: Secondary | ICD-10-CM | POA: Diagnosis not present

## 2020-01-26 DIAGNOSIS — I493 Ventricular premature depolarization: Secondary | ICD-10-CM | POA: Diagnosis not present

## 2020-01-26 DIAGNOSIS — I4819 Other persistent atrial fibrillation: Secondary | ICD-10-CM

## 2020-01-26 DIAGNOSIS — E785 Hyperlipidemia, unspecified: Secondary | ICD-10-CM | POA: Insufficient documentation

## 2020-01-26 DIAGNOSIS — Z79899 Other long term (current) drug therapy: Secondary | ICD-10-CM | POA: Insufficient documentation

## 2020-01-26 HISTORY — DX: Heart failure, unspecified: I50.9

## 2020-01-26 HISTORY — DX: Other persistent atrial fibrillation: I48.19

## 2020-01-26 LAB — BASIC METABOLIC PANEL
Anion gap: 13 (ref 5–15)
BUN: 20 mg/dL (ref 8–23)
CO2: 27 mmol/L (ref 22–32)
Calcium: 10.1 mg/dL (ref 8.9–10.3)
Chloride: 99 mmol/L (ref 98–111)
Creatinine, Ser: 2.23 mg/dL — ABNORMAL HIGH (ref 0.61–1.24)
GFR, Estimated: 30 mL/min — ABNORMAL LOW (ref >60)
Glucose, Bld: 106 mg/dL — ABNORMAL HIGH (ref 70–99)
Potassium: 3.9 mmol/L (ref 3.5–5.1)
Sodium: 139 mmol/L (ref 135–145)

## 2020-01-26 LAB — LIPID PANEL
Cholesterol: 119 mg/dL (ref 0–200)
HDL: 45 mg/dL (ref >40)
LDL Cholesterol: 59 mg/dL (ref 0–99)
Total CHOL/HDL Ratio: 2.6 RATIO
Triglycerides: 76 mg/dL (ref <150)
VLDL: 15 mg/dL (ref 0–40)

## 2020-01-26 LAB — CBC
HCT: 40.5 % (ref 39.0–52.0)
Hemoglobin: 13.1 g/dL (ref 13.0–17.0)
MCH: 31.6 pg (ref 26.0–34.0)
MCHC: 32.3 g/dL (ref 30.0–36.0)
MCV: 97.6 fL (ref 80.0–100.0)
Platelets: 381 10*3/uL (ref 150–400)
RBC: 4.15 MIL/uL — ABNORMAL LOW (ref 4.22–5.81)
RDW: 14.4 % (ref 11.5–15.5)
WBC: 9.3 10*3/uL (ref 4.0–10.5)
nRBC: 0 % (ref 0.0–0.2)

## 2020-01-26 LAB — PROTIME-INR
INR: 1.6 — ABNORMAL HIGH (ref 0.8–1.2)
Prothrombin Time: 18.1 seconds — ABNORMAL HIGH (ref 11.4–15.2)

## 2020-01-26 MED ORDER — ATORVASTATIN CALCIUM 80 MG PO TABS: 80.0000 mg | ORAL_TABLET | Freq: Every day | ORAL | 1 refills | Status: AC

## 2020-01-26 MED ORDER — METOPROLOL SUCCINATE ER 25 MG PO TB24
25.0000 mg | ORAL_TABLET | Freq: Two times a day (BID) | ORAL | 5 refills | Status: DC
Start: 2020-01-26 — End: 2020-01-26

## 2020-01-26 MED ORDER — FUROSEMIDE 40 MG PO TABS: 60.0000 mg | ORAL_TABLET | Freq: Every day | ORAL | 6 refills | Status: AC

## 2020-01-26 MED ORDER — METOPROLOL SUCCINATE ER 25 MG PO TB24
25.0000 mg | ORAL_TABLET | Freq: Two times a day (BID) | ORAL | 5 refills | Status: DC
Start: 2020-01-26 — End: 2020-02-12

## 2020-01-26 MED ORDER — APIXABAN 2.5 MG PO TABS: 2.5000 mg | ORAL_TABLET | Freq: Two times a day (BID) | ORAL | 6 refills | Status: AC

## 2020-01-26 NOTE — Patient Instructions (Addendum)
INCREASE Toprol XL 25mg  (1 tab) twice a day  Labs today We will only contact you if something comes back abnormal or we need to make some changes. Otherwise no news is good news!  Your physician has requested that you have an echocardiogram. Echocardiography is a painless test that uses sound waves to create images of your heart. It provides your doctor with information about the size and shape of your heart and how well your heart's chambers and valves are working. This procedure takes approximately one hour. There are no restrictions for this procedure.  You have been referred to the Pulmonary department for evaluation of your lung disease. You will get a call to schedule this appointment  You have been referred to Cardiac Electrophysiology to manage your irregular heart rhythm.  They will call you to schedule this appointment.     Your physician recommends that you schedule a follow-up appointment in: 2 weeks with Dr   Your physician has recommended that you have a Cardioversion (DCCV). Electrical Cardioversion uses a jolt of electricity to your heart either through paddles or wired patches attached to your chest. This is a controlled, usually prescheduled, procedure. Defibrillation is done under light anesthesia in the hospital, and you usually go home the day of the procedure. This is done to get your heart back into a normal rhythm. You are not awake for the procedure. Please see the instruction sheet given to you today.   Dear [name], Rando are scheduled for a Cardioversion on Monday November 15th, 2021 with Dr. November 17th, 2021.  Please arrive at the [facility] (Main Entrance A) at [facility]: [address] [place], [place] at 6:30 am (1 hour prior to procedure unless lab work is needed; if lab work is needed arrive 1.5 hours ahead)  DIET: Nothing to eat or drink after midnight except a sip of water with medications (see medication instructions below)  Medication  Instructions: Hold all morning medications except for eliquis  Continue your anticoagulant: Eliquis. (do not miss any doses) You will need to continue your anticoagulant after your procedure until you  are told by your  Provider that it is safe to stop   Labs: Done today in office.   You will need a pre procedure COVID test    WHEN:  Satruday November 13th, 2021 at 10:15am WHERE: COVID Test Site      [address] [place], [place]  This is a drive thru testing site, you will remain in your car. Be sure to get in the line FOR PROCEDURES Once you have been swabbed you will need to remain home in quarantine until you return for your procedure.   You must have a responsible person to drive you home and stay in the waiting area during your procedure. Failure to do so could result in cancellation.  Bring your insurance cards.  *Special Note: Every effort is made to have your procedure done on time. Occasionally there are emergencies that occur at the hospital that may cause delays. Please be patient if a delay does occur.

## 2020-01-27 ENCOUNTER — Other Ambulatory Visit (HOSPITAL_COMMUNITY)
Admission: RE | Admit: 2020-01-27 | Discharge: 2020-01-27 | Disposition: A | Discharge status: Home / Self Care | Payer: Medicare Other | Source: Ambulatory Visit | Attending: Cardiology | Admitting: Cardiology

## 2020-01-27 DIAGNOSIS — Z20822 Contact with and (suspected) exposure to covid-19: Secondary | ICD-10-CM | POA: Insufficient documentation

## 2020-01-27 DIAGNOSIS — Z01812 Encounter for preprocedural laboratory examination: Secondary | ICD-10-CM | POA: Diagnosis present

## 2020-01-27 LAB — SARS CORONAVIRUS 2 (TAT 6-24 HRS): SARS Coronavirus 2: NEGATIVE

## 2020-01-27 NOTE — H&P (View-Only) (Signed)
Advanced Heart Failure Clinic Note    PCP: Renford Dills, MD PCP-Cardiologist: No primary care provider on file.  HF cardiologist: Dr Shirlee Latch  HPI: Derrick Marsh is a 74 y.o. male with a history of hypertension, hyperlipidemia, COPD, gout, CAD, CABGin 2000, PAD, tobacco abuse, CKD stage 3, and PVCs.  Admitted 6/14-6/25/19 with atypical atrial flutter and acute systolic HF. Echo showed EF 20-25% with moderate RV dysfunction. He was diuresed with IV lasix. He had AKI on CKD stage 3 with creatinine peak of 3.17.  Creatinine improved with holding diuresis. He required milrinone for low output HF. Underwent successful TEE/DCCV, but procedure complicated by hypotension requiring pressors and respiratory distress requiring intubation. He converted back into atrial flutter the following day, but then converted to NSR with amiodarone. EP did not think he was a good ablation candidate. Transitioned to po amiodarone and Eliquis prior to discharge. He was also treated for PNA. He was discharged with home O2. DC weight: 106 lbs.    Most recent echo in 10/19 showed EF 20-25%, mild LV dilation, PASP 48 mmHg.   PFTs in 1/20 showed moderate-severe restriction with severely decreased DLCO.  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   He returns today for followup of CHF and CAD.  He does not feel palpitations, but his heart rate is elevated and he is in atypical atrial flutter today with rate 110s.  Oxygen saturation with ambulation is ok, 94%. He had one episode of lightheadedness last Thursday that passed quickly, no orthostatic symptoms since then.  Mild dyspnea walking up stairs and hills, minimal dyspnea on flat ground.  Weight down 4 lbs.  He has been taking Eliquis without missing it.  No chest pain.      ECG (personally reviewed): Atypical atrial flutter with rate 115  Labs (7/19): TSH normal, LFTs normal, hgb 11.5 Labs (8/19): K 3.7,  creatinine 2.29 Labs (10/19): K 3.5, creatinine 2.68, AST 43, ALT normal, TSH normal, hgb 11.2 Labs (1/20): K 4.4, creatinine 2.5, LDL 97, LFTs normal, TSH normal Labs (3/20): K 4.5, creatinine 2.58 Labs (1/21): LDL 59, HDL 50  Review of systems complete and found to be negative unless listed in HPI.    PMH: 1. CAD: Acute inferior MI in 2000 with CABG => LIMA-LAD, SVG-OM, SVG-RCA.  2. PAD 3. HTN 4. Hyperlipidemia 5. PVCs 6. COPD: Quit smoking in 5/19.  7. Chronic systolic CHF: Ischemic cardiomyopathy.  - Echo (10/19): EF 20-25%, mild LV dilation, diffuse hypokinesis, PASP 48 mmHg.  8. Gout 9. Atrial flutter: Atypical in 6/19, required DCCV and amiodarone.  Seen by EP, not thought to be a good ablation candidate.  10. Interstitial lung disease:  - PFTs (1/20): moderate-severe restriction with severely decreased DLCO. 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    Current Outpatient Medications  Medication Sig Dispense Refill  . acetaminophen (TYLENOL) 500 MG tablet Take 1,000 mg by mouth every 6 (six) hours as needed for mild pain.     Marland Kitchen apixaban (ELIQUIS) 2.5 MG TABS tablet Take 1 tablet (2.5 mg total) by mouth 2 (two) times daily. 60 tablet 6  . atorvastatin (LIPITOR) 80 MG tablet Take 1 tablet (80 mg total) by mouth daily. 90 tablet 1  . colchicine 0.6 MG tablet Take 0.6 mg by mouth daily as needed (for gout flares).     . furosemide (LASIX) 40 MG tablet Take 1.5 tablets (60 mg total)  by mouth daily. 45 tablet 6  . metoprolol succinate (TOPROL-XL) 25 MG 24 hr tablet Take 1 tablet (25 mg total) by mouth 2 (two) times daily. 60 tablet 5  . OXYGEN Inhale into the lungs. As needed for SOB    . trolamine salicylate (ASPERCREME) 10 % cream Apply 1 application topically as needed for muscle pain.     No current facility-administered medications for this encounter.    Allergies  Allergen Reactions  . Aleve [Naproxen] Other (See Comments)    Was told by  a MD to not take this; conflicted with his other meds      Social History   Socioeconomic History  . Marital status: Single    Spouse name: Not on file  . Number of children: Not on file  . Years of education: Not on file  . Highest education level: Not on file  Occupational History  . Not on file  Tobacco Use  . Smoking status: Former Smoker    Packs/day: 0.50    Types: Cigars    Quit date: 07/14/2017    Years since quitting: 2.5  . Smokeless tobacco: Never Used  Vaping Use  . Vaping Use: Never used  Substance and Sexual Activity  . Alcohol use: No  . Drug use: No  . Sexual activity: Not on file  Other Topics Concern  . Not on file  Social History Narrative  . Not on file   Social Determinants of Health   Financial Resource Strain:   . Difficulty of Paying Living Expenses: Not on file  Food Insecurity:   . Worried About Programme researcher, broadcasting/film/video in the Last Year: Not on file  . Ran Out of Food in the Last Year: Not on file  Transportation Needs:   . Lack of Transportation (Medical): Not on file  . Lack of Transportation (Non-Medical): Not on file  Physical Activity:   . Days of Exercise per Week: Not on file  . Minutes of Exercise per Session: Not on file  Stress:   . Feeling of Stress : Not on file  Social Connections:   . Frequency of Communication with Friends and Family: Not on file  . Frequency of Social Gatherings with Friends and Family: Not on file  . Attends Religious Services: Not on file  . Active Member of Clubs or Organizations: Not on file  . Attends Banker Meetings: Not on file  . Marital Status: Not on file  Intimate Partner Violence:   . Fear of Current or Ex-Partner: Not on file  . Emotionally Abused: Not on file  . Physically Abused: Not on file  . Sexually Abused: Not on file      Family History  Problem Relation Age of Onset  . Heart disease Father   . Emphysema Father   . CVA Father   . Heart failure Mother     Vitals:    01/26/20 1122  BP: 104/60  Pulse: 89  SpO2: 93%  Weight: 45.3 kg (99 lb 12.8 oz)   Wt Readings from Last 3 Encounters:  01/26/20 45.3 kg (99 lb 12.8 oz)  04/06/19 47.8 kg (105 lb 6.4 oz)  10/10/18 43.3 kg (95 lb 6.4 oz)    PHYSICAL EXAM: General: NAD Neck: No JVD, no thyromegaly or thyroid nodule.  Lungs: Decreased BS bilaterally.  CV: Nondisplaced PMI.  Heart mildly tachy, irregular S1/S2, no S3/S4, no murmur.  No peripheral edema.  No carotid bruit.  Normal pedal pulses.  Abdomen: Soft, nontender, no hepatosplenomegaly, no distention.  Skin: Intact without lesions or rashes.  Neurologic: Alert and oriented x 3.  Psych: Normal affect. Extremities: No clubbing or cyanosis.  HEENT: Normal.   ASSESSMENT & PLAN:  1. Chronic systolic HF:  Ischemic cardiomyopathy.  He also has hx of CABG, but unable to do LHC during 6/19 admission due to AKI on CKD stage 3. During that admission, he required milrinone for low output/cardiogenic shock. Echo (6/19) showed EF 20-25%, moderate RV systolic dysfunction. Echo (10/19) showed EF 20-25%, no significant change.   NYHA class II symptoms.  He does not appear volume overloaded but he is back in atrial flutter with mild RVR. This led to CHF decompensation in the past.   - Continue Lasix 60 mg daily.  BMET today.   - No ACEI/ARB/ARNI/digoxin/spironolactone with CKD.  - Increase Toprol XL to 25 mg bid.  - Cannot rule out progression of CAD with ischemic CMP though doubt ACS at 6/19 admission. Would not do coronary angiography at this point with high creatinine unless he has clear ACS.  - Would likely not be candidate for advanced therapies with his degree of CKD.  - He qualifies for ICD with persistently low EF but not CRT (do not think QRS is wide enough for him to benefit significantly). We talked about ICD again today, he still is not sure that he wants.  - He is due for repeat echo, will obtain when we get him back in NSR.  2. Atrial flutter:  Atypical at 6/19 admission. He had DCCV with recurrence, then started on amiodarone and back in NSR.  He was seen by EP, not thought to be a good ablation candidate. Amiodarone was stopped due to concern for possible amiodarone lung toxicity.  He is now back in atypical flutter.   - With advanced CKD and inability to take amiodarone, we do not have good options for anti-arrhythmic to maintain NSR.    - Continue eliquis 2.5 mg BID, he has not missed doses. CBC today.  - I will arrange for DCCV on Monday.  He will come in over the weekend if symptoms worsen.  We discussed risks/benefits and he agrees to procedure.  - I will refer back to EP to discuss ablation again.  3. CKD stage 3: Following with Dr Signe Colt now.  - BMET today.   4. CAD: s/p CABG 2000.  No cath since that time. See discussion above, will not cath unless he has significant symptoms.  No chest pain.   - Continue statin, check lipids today.  - No ASA given stable CAD with Eliquis use.  5. COPD: He has quit smoking since 5/19.  Not on oxygen.  6. 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  I am concerned that amiodarone could have played a role. He is now off amiodarone.  - He should have an appointment in [facility] pulmonary clinic, will refer.   Marca Ancona, MD 01/27/20

## 2020-01-27 NOTE — Progress Notes (Signed)
Advanced Heart Failure Clinic Note    PCP: Renford Dills, MD PCP-Cardiologist: No primary care provider on file.  HF cardiologist: Dr Shirlee Latch  HPI: Derrick Marsh is a 74 y.o. male with a history of hypertension, hyperlipidemia, COPD, gout, CAD, CABGin 2000, PAD, tobacco abuse, CKD stage 3, and PVCs.  Admitted 6/14-6/25/19 with atypical atrial flutter and acute systolic HF. Echo showed EF 20-25% with moderate RV dysfunction. He was diuresed with IV lasix. He had AKI on CKD stage 3 with creatinine peak of 3.17.  Creatinine improved with holding diuresis. He required milrinone for low output HF. Underwent successful TEE/DCCV, but procedure complicated by hypotension requiring pressors and respiratory distress requiring intubation. He converted back into atrial flutter the following day, but then converted to NSR with amiodarone. EP did not think he was a good ablation candidate. Transitioned to po amiodarone and Eliquis prior to discharge. He was also treated for PNA. He was discharged with home O2. DC weight: 106 lbs.    Most recent echo in 10/19 showed EF 20-25%, mild LV dilation, PASP 48 mmHg.   PFTs in 1/20 showed moderate-severe restriction with severely decreased DLCO.  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   He returns today for followup of CHF and CAD.  He does not feel palpitations, but his heart rate is elevated and he is in atypical atrial flutter today with rate 110s.  Oxygen saturation with ambulation is ok, 94%. He had one episode of lightheadedness last Thursday that passed quickly, no orthostatic symptoms since then.  Mild dyspnea walking up stairs and hills, minimal dyspnea on flat ground.  Weight down 4 lbs.  He has been taking Eliquis without missing it.  No chest pain.      ECG (personally reviewed): Atypical atrial flutter with rate 115  Labs (7/19): TSH normal, LFTs normal, hgb 11.5 Labs (8/19): K 3.7,  creatinine 2.29 Labs (10/19): K 3.5, creatinine 2.68, AST 43, ALT normal, TSH normal, hgb 11.2 Labs (1/20): K 4.4, creatinine 2.5, LDL 97, LFTs normal, TSH normal Labs (3/20): K 4.5, creatinine 2.58 Labs (1/21): LDL 59, HDL 50  Review of systems complete and found to be negative unless listed in HPI.    PMH: 1. CAD: Acute inferior MI in 2000 with CABG => LIMA-LAD, SVG-OM, SVG-RCA.  2. PAD 3. HTN 4. Hyperlipidemia 5. PVCs 6. COPD: Quit smoking in 5/19.  7. Chronic systolic CHF: Ischemic cardiomyopathy.  - Echo (10/19): EF 20-25%, mild LV dilation, diffuse hypokinesis, PASP 48 mmHg.  8. Gout 9. Atrial flutter: Atypical in 6/19, required DCCV and amiodarone.  Seen by EP, not thought to be a good ablation candidate.  10. Interstitial lung disease:  - PFTs (1/20): moderate-severe restriction with severely decreased DLCO. 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    Current Outpatient Medications  Medication Sig Dispense Refill  . acetaminophen (TYLENOL) 500 MG tablet Take 1,000 mg by mouth every 6 (six) hours as needed for mild pain.     Marland Kitchen apixaban (ELIQUIS) 2.5 MG TABS tablet Take 1 tablet (2.5 mg total) by mouth 2 (two) times daily. 60 tablet 6  . atorvastatin (LIPITOR) 80 MG tablet Take 1 tablet (80 mg total) by mouth daily. 90 tablet 1  . colchicine 0.6 MG tablet Take 0.6 mg by mouth daily as needed (for gout flares).     . furosemide (LASIX) 40 MG tablet Take 1.5 tablets (60 mg total)  by mouth daily. 45 tablet 6  . metoprolol succinate (TOPROL-XL) 25 MG 24 hr tablet Take 1 tablet (25 mg total) by mouth 2 (two) times daily. 60 tablet 5  . OXYGEN Inhale into the lungs. As needed for SOB    . trolamine salicylate (ASPERCREME) 10 % cream Apply 1 application topically as needed for muscle pain.     No current facility-administered medications for this encounter.    Allergies  Allergen Reactions  . Aleve [Naproxen] Other (See Comments)    Was told by  a MD to not take this; conflicted with his other meds      Social History   Socioeconomic History  . Marital status: Single    Spouse name: Not on file  . Number of children: Not on file  . Years of education: Not on file  . Highest education level: Not on file  Occupational History  . Not on file  Tobacco Use  . Smoking status: Former Smoker    Packs/day: 0.50    Types: Cigars    Quit date: 07/14/2017    Years since quitting: 2.5  . Smokeless tobacco: Never Used  Vaping Use  . Vaping Use: Never used  Substance and Sexual Activity  . Alcohol use: No  . Drug use: No  . Sexual activity: Not on file  Other Topics Concern  . Not on file  Social History Narrative  . Not on file   Social Determinants of Health   Financial Resource Strain:   . Difficulty of Paying Living Expenses: Not on file  Food Insecurity:   . Worried About Programme researcher, broadcasting/film/video in the Last Year: Not on file  . Ran Out of Food in the Last Year: Not on file  Transportation Needs:   . Lack of Transportation (Medical): Not on file  . Lack of Transportation (Non-Medical): Not on file  Physical Activity:   . Days of Exercise per Week: Not on file  . Minutes of Exercise per Session: Not on file  Stress:   . Feeling of Stress : Not on file  Social Connections:   . Frequency of Communication with Friends and Family: Not on file  . Frequency of Social Gatherings with Friends and Family: Not on file  . Attends Religious Services: Not on file  . Active Member of Clubs or Organizations: Not on file  . Attends Banker Meetings: Not on file  . Marital Status: Not on file  Intimate Partner Violence:   . Fear of Current or Ex-Partner: Not on file  . Emotionally Abused: Not on file  . Physically Abused: Not on file  . Sexually Abused: Not on file      Family History  Problem Relation Age of Onset  . Heart disease Father   . Emphysema Father   . CVA Father   . Heart failure Mother     Vitals:    01/26/20 1122  BP: 104/60  Pulse: 89  SpO2: 93%  Weight: 45.3 kg (99 lb 12.8 oz)   Wt Readings from Last 3 Encounters:  01/26/20 45.3 kg (99 lb 12.8 oz)  04/06/19 47.8 kg (105 lb 6.4 oz)  10/10/18 43.3 kg (95 lb 6.4 oz)    PHYSICAL EXAM: General: NAD Neck: No JVD, no thyromegaly or thyroid nodule.  Lungs: Decreased BS bilaterally.  CV: Nondisplaced PMI.  Heart mildly tachy, irregular S1/S2, no S3/S4, no murmur.  No peripheral edema.  No carotid bruit.  Normal pedal pulses.  Abdomen: Soft, nontender, no hepatosplenomegaly, no distention.  Skin: Intact without lesions or rashes.  Neurologic: Alert and oriented x 3.  Psych: Normal affect. Extremities: No clubbing or cyanosis.  HEENT: Normal.   ASSESSMENT & PLAN:  1. Chronic systolic HF:  Ischemic cardiomyopathy.  He also has hx of CABG, but unable to do LHC during 6/19 admission due to AKI on CKD stage 3. During that admission, he required milrinone for low output/cardiogenic shock. Echo (6/19) showed EF 20-25%, moderate RV systolic dysfunction. Echo (10/19) showed EF 20-25%, no significant change.   NYHA class II symptoms.  He does not appear volume overloaded but he is back in atrial flutter with mild RVR. This led to CHF decompensation in the past.   - Continue Lasix 60 mg daily.  BMET today.   - No ACEI/ARB/ARNI/digoxin/spironolactone with CKD.  - Increase Toprol XL to 25 mg bid.  - Cannot rule out progression of CAD with ischemic CMP though doubt ACS at 6/19 admission. Would not do coronary angiography at this point with high creatinine unless he has clear ACS.  - Would likely not be candidate for advanced therapies with his degree of CKD.  - He qualifies for ICD with persistently low EF but not CRT (do not think QRS is wide enough for him to benefit significantly). We talked about ICD again today, he still is not sure that he wants.  - He is due for repeat echo, will obtain when we get him back in NSR.  2. Atrial flutter:  Atypical at 6/19 admission. He had DCCV with recurrence, then started on amiodarone and back in NSR.  He was seen by EP, not thought to be a good ablation candidate. Amiodarone was stopped due to concern for possible amiodarone lung toxicity.  He is now back in atypical flutter.   - With advanced CKD and inability to take amiodarone, we do not have good options for anti-arrhythmic to maintain NSR.    - Continue eliquis 2.5 mg BID, he has not missed doses. CBC today.  - I will arrange for DCCV on Monday.  He will come in over the weekend if symptoms worsen.  We discussed risks/benefits and he agrees to procedure.  - I will refer back to EP to discuss ablation again.  3. CKD stage 3: Following with Dr Signe Colt now.  - BMET today.   4. CAD: s/p CABG 2000.  No cath since that time. See discussion above, will not cath unless he has significant symptoms.  No chest pain.   - Continue statin, check lipids today.  - No ASA given stable CAD with Eliquis use.  5. COPD: He has quit smoking since 5/19.  Not on oxygen.  6. 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  I am concerned that amiodarone could have played a role. He is now off amiodarone.  - He should have an appointment in [facility] clinic, will refer.   Marca Ancona, MD 01/27/20

## 2020-01-28 NOTE — Anesthesia Preprocedure Evaluation (Addendum)
Anesthesia Evaluation  Patient identified by MRN, date of birth, ID band Patient awake    Reviewed: Allergy & Precautions, NPO status , Patient's Chart, lab work & pertinent test results  Airway Mallampati: I  TM Distance: >3 FB Neck ROM: Full    Dental no notable dental hx. (+) Edentulous Upper, Partial Lower,    Pulmonary shortness of breath, former smoker,  Interstitial lung dx Quit smoking 07/2017    Pulmonary exam normal breath sounds clear to auscultation       Cardiovascular hypertension, Pt. on home beta blockers + CAD, + Past MI (s/p IWMI with vfib arrest), + CABG (CABG x 3 2000) and +CHF (LVEF 20-25%)  Normal cardiovascular exam+ dysrhythmias (eliquis, s/p cardioversion 2019) Atrial Fibrillation and Ventricular Fibrillation + Valvular Problems/Murmurs (mild MR) MR  Rhythm:Regular Rate:Normal  Echo 2019: - Left ventricle: The cavity size was mildly dilated. Systolic  function was severely reduced. The estimated ejection fraction  was in the range of 20% to 25%. Diffuse hypokinesis. There is  akinesis of the inferolateral, inferior, and apical myocardium.  The study is not technically sufficient to allow evaluation of LV  diastolic function. Acoustic contrast opacification revealed no  evidence ofthrombus.  - Mitral valve: Calcified annulus. Mildly thickened leaflets .  There was mild regurgitation.  - Left atrium: The atrium was mildly dilated.  - Pulmonary arteries: Systolic pressure was mildly increased. PA  peak pressure: 48 mm Hg (S).    Neuro/Psych negative neurological ROS  negative psych ROS   GI/Hepatic negative GI ROS, Neg liver ROS,   Endo/Other  negative endocrine ROS  Renal/GU Renal diseaseCr 2.23  negative genitourinary   Musculoskeletal negative musculoskeletal ROS (+)   Abdominal   Peds  Hematology negative hematology ROS (+)   Anesthesia Other Findings    Reproductive/Obstetrics negative OB ROS                            Anesthesia Physical Anesthesia Plan  ASA: IV  Anesthesia Plan: General   Post-op Pain Management:    Induction: Intravenous  PONV Risk Score and Plan: 2 and Propofol infusion, TIVA and Treatment may vary due to age or medical condition  Airway Management Planned: Natural Airway and Mask  Additional Equipment: None  Intra-op Plan:   Post-operative Plan:   Informed Consent: I have reviewed the patients History and Physical, chart, labs and discussed the procedure including the risks, benefits and alternatives for the proposed anesthesia with the patient or authorized representative who has indicated his/her understanding and acceptance.     Dental advisory given  Plan Discussed with: CRNA  Anesthesia Plan Comments:        Anesthesia Quick Evaluation

## 2020-01-29 ENCOUNTER — Ambulatory Visit (HOSPITAL_COMMUNITY): Payer: Medicare Other | Admitting: Anesthesiology

## 2020-01-29 ENCOUNTER — Encounter (HOSPITAL_COMMUNITY)
Admission: RE | Disposition: A | Discharge status: Home / Self Care | Payer: Self-pay | Source: Home / Self Care | Attending: Cardiology

## 2020-01-29 ENCOUNTER — Ambulatory Visit (HOSPITAL_COMMUNITY)
Admission: RE | Admit: 2020-01-29 | Discharge: 2020-01-29 | Disposition: A | Discharge status: Home / Self Care | Payer: Medicare Other | Attending: Cardiology | Admitting: Cardiology

## 2020-01-29 ENCOUNTER — Other Ambulatory Visit: Payer: Self-pay

## 2020-01-29 ENCOUNTER — Encounter (HOSPITAL_COMMUNITY): Payer: Self-pay | Admitting: Cardiology

## 2020-01-29 ENCOUNTER — Ambulatory Visit (HOSPITAL_BASED_OUTPATIENT_CLINIC_OR_DEPARTMENT_OTHER): Payer: Medicare Other

## 2020-01-29 DIAGNOSIS — I5021 Acute systolic (congestive) heart failure: Secondary | ICD-10-CM

## 2020-01-29 DIAGNOSIS — Z951 Presence of aortocoronary bypass graft: Secondary | ICD-10-CM | POA: Insufficient documentation

## 2020-01-29 DIAGNOSIS — Z87891 Personal history of nicotine dependence: Secondary | ICD-10-CM | POA: Insufficient documentation

## 2020-01-29 DIAGNOSIS — N183 Chronic kidney disease, stage 3 unspecified: Secondary | ICD-10-CM | POA: Diagnosis not present

## 2020-01-29 DIAGNOSIS — M109 Gout, unspecified: Secondary | ICD-10-CM | POA: Insufficient documentation

## 2020-01-29 DIAGNOSIS — I130 Hypertensive heart and chronic kidney disease with heart failure and stage 1 through stage 4 chronic kidney disease, or unspecified chronic kidney disease: Secondary | ICD-10-CM | POA: Diagnosis not present

## 2020-01-29 DIAGNOSIS — Z7901 Long term (current) use of anticoagulants: Secondary | ICD-10-CM | POA: Diagnosis not present

## 2020-01-29 DIAGNOSIS — I5023 Acute on chronic systolic (congestive) heart failure: Secondary | ICD-10-CM | POA: Diagnosis not present

## 2020-01-29 DIAGNOSIS — E785 Hyperlipidemia, unspecified: Secondary | ICD-10-CM | POA: Diagnosis not present

## 2020-01-29 DIAGNOSIS — I2510 Atherosclerotic heart disease of native coronary artery without angina pectoris: Secondary | ICD-10-CM | POA: Insufficient documentation

## 2020-01-29 DIAGNOSIS — I081 Rheumatic disorders of both mitral and tricuspid valves: Secondary | ICD-10-CM | POA: Diagnosis not present

## 2020-01-29 DIAGNOSIS — Z79899 Other long term (current) drug therapy: Secondary | ICD-10-CM | POA: Diagnosis not present

## 2020-01-29 DIAGNOSIS — I255 Ischemic cardiomyopathy: Secondary | ICD-10-CM | POA: Insufficient documentation

## 2020-01-29 DIAGNOSIS — I4891 Unspecified atrial fibrillation: Secondary | ICD-10-CM

## 2020-01-29 DIAGNOSIS — J449 Chronic obstructive pulmonary disease, unspecified: Secondary | ICD-10-CM | POA: Diagnosis not present

## 2020-01-29 DIAGNOSIS — J8410 Pulmonary fibrosis, unspecified: Secondary | ICD-10-CM | POA: Diagnosis not present

## 2020-01-29 HISTORY — PX: CARDIOVERSION: SHX1299

## 2020-01-29 LAB — ECHOCARDIOGRAM COMPLETE
Area-P 1/2: 5.84 cm2
Height: 62 in
MV M vel: 4.31 m/s
MV Peak grad: 74.3 mmHg
Radius: 0.4 cm
S' Lateral: 4.9 cm
Single Plane A4C EF: 34.8 %
Weight: 1600 oz

## 2020-01-29 SURGERY — CARDIOVERSION
Anesthesia: General

## 2020-01-29 MED ORDER — PHENYLEPHRINE HCL (PRESSORS) 10 MG/ML IV SOLN: INTRAVENOUS | Status: DC | PRN

## 2020-01-29 MED ORDER — SODIUM CHLORIDE 0.9 % IV SOLN
INTRAVENOUS | Status: AC | PRN
  Administered 2020-01-29: 10 mL via INTRAVENOUS

## 2020-01-29 MED ORDER — PHENYLEPHRINE 40 MCG/ML (10ML) SYRINGE FOR IV PUSH (FOR BLOOD PRESSURE SUPPORT)
PREFILLED_SYRINGE | INTRAVENOUS | Status: DC | PRN
  Administered 2020-01-29: 80 ug via INTRAVENOUS
  Administered 2020-01-29: 40 ug via INTRAVENOUS
  Administered 2020-01-29 (×2): 80 ug via INTRAVENOUS

## 2020-01-29 MED ORDER — PERFLUTREN LIPID MICROSPHERE
1.0000 mL | INTRAVENOUS | Status: DC | PRN
  Administered 2020-01-29: 5 mL via INTRAVENOUS
  Filled 2020-01-29: qty 10

## 2020-01-29 MED ORDER — PROPOFOL 10 MG/ML IV BOLUS
INTRAVENOUS | Status: DC | PRN
  Administered 2020-01-29: 30 mg via INTRAVENOUS
  Administered 2020-01-29: 20 mg via INTRAVENOUS
  Administered 2020-01-29: 10 mg via INTRAVENOUS

## 2020-01-29 MED ORDER — LIDOCAINE 2% (20 MG/ML) 5 ML SYRINGE
INTRAMUSCULAR | Status: DC | PRN
  Administered 2020-01-29: 20 mg via INTRAVENOUS

## 2020-01-29 NOTE — Anesthesia Procedure Notes (Signed)
Procedure Name: General with mask airway Date/Time: 01/29/2020 7:45 AM Performed by: Nils Pyle, CRNA Pre-anesthesia Checklist: Patient identified, Emergency Drugs available, Suction available and Patient being monitored Patient Re-evaluated:Patient Re-evaluated prior to induction Oxygen Delivery Method: Ambu bag Preoxygenation: Pre-oxygenation with 100% oxygen Induction Type: IV induction Ventilation: Mask ventilation without difficulty Placement Confirmation: positive ETCO2 and breath sounds checked- equal and bilateral Dental Injury: Teeth and Oropharynx as per pre-operative assessment

## 2020-01-29 NOTE — Transfer of Care (Signed)
Immediate Anesthesia Transfer of Care Note  Patient: Derrick Marsh  Procedure(s) Performed: CARDIOVERSION (N/A )  Patient Location: Endoscopy Unit  Anesthesia Type:General  Level of Consciousness: awake and drowsy  Airway & Oxygen Therapy: Patient Spontanous Breathing  Post-op Assessment: Report given to RN, Post -op Vital signs reviewed and stable and Patient moving all extremities X 4  Post vital signs: Reviewed and stable  Last Vitals:  Vitals Value Taken Time  BP    Temp    Pulse    Resp    SpO2      Last Pain:  Vitals:   01/29/20 0654  TempSrc: Oral  PainSc: 0-No pain         Complications: No complications documented.

## 2020-01-29 NOTE — Discharge Instructions (Signed)
Electrical Cardioversion Electrical cardioversion is the delivery of a jolt of electricity to restore a normal rhythm to the heart. A rhythm that is too fast or is not regular keeps the heart from pumping well. In this procedure, sticky patches or metal paddles are placed on the chest to deliver electricity to the heart from a device. This procedure may be done in an emergency if:  There is low or no blood pressure as a result of the heart rhythm.  Normal rhythm must be restored as fast as possible to protect the brain and heart from further damage.  It may save a life. This may also be a scheduled procedure for irregular or fast heart rhythms that are not immediately life-threatening. Tell a health care provider about:  Any allergies you have.  All medicines you are taking, including vitamins, herbs, eye drops, creams, and over-the-counter medicines.  Any problems you or family members have had with anesthetic medicines.  Any blood disorders you have.  Any surgeries you have had.  Any medical conditions you have.  Whether you are pregnant or may be pregnant. What are the risks? Generally, this is a safe procedure. However, problems may occur, including:  Allergic reactions to medicines.  A blood clot that breaks free and travels to other parts of your body.  The possible return of an abnormal heart rhythm within hours or days after the procedure.  Your heart stopping (cardiac arrest). This is rare. What happens before the procedure? Medicines  Your health care provider may have you start taking: ? Blood-thinning medicines (anticoagulants) so your blood does not clot as easily. ? Medicines to help stabilize your heart rate and rhythm.  Ask your health care provider about: ? Changing or stopping your regular medicines. This is especially important if you are taking diabetes medicines or blood thinners. ? Taking medicines such as aspirin and ibuprofen. These medicines can  thin your blood. Do not take these medicines unless your health care provider tells you to take them. ? Taking over-the-counter medicines, vitamins, herbs, and supplements. General instructions  Follow instructions from your health care provider about eating or drinking restrictions.  Plan to have someone take you home from the hospital or clinic.  If you will be going home right after the procedure, plan to have someone with you for 24 hours.  Ask your health care provider what steps will be taken to help prevent infection. These may include washing your skin with a germ-killing soap. What happens during the procedure?   An IV will be inserted into one of your veins.  Sticky patches (electrodes) or metal paddles may be placed on your chest.  You will be given a medicine to help you relax (sedative).  An electrical shock will be delivered. The procedure may vary among health care providers and hospitals. What can I expect after the procedure?  Your blood pressure, heart rate, breathing rate, and blood oxygen level will be monitored until you leave the hospital or clinic.  Your heart rhythm will be watched to make sure it does not change.  You may have some redness on the skin where the shocks were given. Follow these instructions at home:  Do not drive for 24 hours if you were given a sedative during your procedure.  Take over-the-counter and prescription medicines only as told by your health care provider.  Ask your health care provider how to check your pulse. Check it often.  Rest for 48 hours after the procedure or   as told by your health care provider.  Avoid or limit your caffeine use as told by your health care provider.  Keep all follow-up visits as told by your health care provider. This is important. Contact a health care provider if:  You feel like your heart is beating too quickly or your pulse is not regular.  You have a serious muscle cramp that does not go  away. Get help right away if:  You have discomfort in your chest.  You are dizzy or you feel faint.  You have trouble breathing or you are short of breath.  Your speech is slurred.  You have trouble moving an arm or leg on one side of your body.  Your fingers or toes turn cold or blue. Summary  Electrical cardioversion is the delivery of a jolt of electricity to restore a normal rhythm to the heart.  This procedure may be done right away in an emergency or may be a scheduled procedure if the condition is not an emergency.  Generally, this is a safe procedure.  After the procedure, check your pulse often as told by your health care provider. This information is not intended to replace advice given to you by your health care provider. Make sure you discuss any questions you have with your health care provider. Document Revised: 10/03/2018 Document Reviewed: 10/03/2018 Elsevier Patient Education  2020 [employer]  

## 2020-01-29 NOTE — Anesthesia Postprocedure Evaluation (Signed)
Anesthesia Post Note  Patient: Verlan Friends  Procedure(s) Performed: CARDIOVERSION (N/A )     Patient location during evaluation: PACU Anesthesia Type: General Level of consciousness: awake and alert, oriented and patient cooperative Pain management: pain level controlled Vital Signs Assessment: post-procedure vital signs reviewed and stable Respiratory status: spontaneous breathing, nonlabored ventilation and respiratory function stable Cardiovascular status: blood pressure returned to baseline and stable Postop Assessment: no apparent nausea or vomiting Anesthetic complications: no   No complications documented.  Last Vitals:  Vitals:   01/29/20 0820 01/29/20 0825  BP: (!) 94/51 (!) 100/56  Pulse: 61 60  Resp: (!) 22 14  Temp:    SpO2: 95% 92%    Last Pain:  Vitals:   01/29/20 0825  TempSrc:   PainSc: (P) 0-No pain                 Lannie Fields

## 2020-01-29 NOTE — Procedures (Addendum)
Electrical Cardioversion Procedure Note Derrick Marsh [id number] 31-Aug-1945  Procedure: Electrical Cardioversion Indications:  Atrial Fibrillation  Procedure Details Consent: Risks of procedure as well as the alternatives and risks of each were explained to the (patient/caregiver).  Consent for procedure obtained. Time Out: Verified patient identification, verified procedure, site/side was marked, verified correct patient position, special equipment/implants available, medications/allergies/relevent history reviewed, required imaging and test results available.  Performed  Patient placed on cardiac monitor, pulse oximetry, supplemental oxygen as necessary.  Sedation given: Propofol per anesthesiology Pacer pads placed anterior and posterior chest.  Cardioverted 3 times, the 2nd two times with sternal pressure.  Cardioverted at 200J.  Evaluation Findings: Post procedure EKG shows: NSR with PACs Complications: None Patient did tolerate procedure well.   Marca Ancona 01/29/2020, 7:56 AM

## 2020-01-29 NOTE — Progress Notes (Signed)
  Echocardiogram 2D Echocardiogram has been performed.  Derrick Marsh 01/29/2020, 9:32 AM

## 2020-01-29 NOTE — Interval H&P Note (Signed)
History and Physical Interval Note:  01/29/2020 7:48 AM  Derrick Marsh  has presented today for surgery, with the diagnosis of afib.  The various methods of treatment have been discussed with the patient and family. After consideration of risks, benefits and other options for treatment, the patient has consented to  Procedure(s): CARDIOVERSION (N/A) as a surgical intervention.  The patient's history has been reviewed, patient examined, no change in status, stable for surgery.  I have reviewed the patient's chart and labs.  Questions were answered to the patient's satisfaction.     Derrick Marsh [employer]

## 2020-01-31 ENCOUNTER — Ambulatory Visit: Payer: Medicare Other | Admitting: Internal Medicine

## 2020-01-31 ENCOUNTER — Encounter: Payer: Self-pay | Admitting: Internal Medicine

## 2020-01-31 ENCOUNTER — Other Ambulatory Visit: Payer: Self-pay

## 2020-01-31 VITALS — BP 116/66 | HR 79 | Ht 62.0 in | Wt 99.6 lb

## 2020-01-31 DIAGNOSIS — I4819 Other persistent atrial fibrillation: Secondary | ICD-10-CM | POA: Diagnosis not present

## 2020-01-31 DIAGNOSIS — I484 Atypical atrial flutter: Secondary | ICD-10-CM

## 2020-01-31 DIAGNOSIS — I519 Heart disease, unspecified: Secondary | ICD-10-CM | POA: Diagnosis not present

## 2020-01-31 DIAGNOSIS — D6869 Other thrombophilia: Secondary | ICD-10-CM | POA: Diagnosis not present

## 2020-01-31 NOTE — Progress Notes (Signed)
Electrophysiology Office Note   Date:  01/31/2020   ID:  Derrick Marsh, Derrick Marsh 1945/07/28, MRN [id number]  PCP:  Renford Dills, MD  Cardiologist:  Drs Turner/McLean Primary Electrophysiologist: Hillis Range, MD    CC: atrial arrhythmias   History of Present Illness: Derrick Marsh is a 74 y.o. male who presents today for electrophysiology evaluation.   He is referred by Dr Shirlee Latch for EP consultation regarding atrial arrhythmias. The patient has advanced structural heart disease (EF <20%), severe valvular dysfunction, and advanced lung disease.  He also has advanced renal failure.   He developed atypical atrial flutter in 2019 (EKG reviewed).  He converted to sinus rhythm and did well until this past week (without any symptoms of arrhythmia).  This past week, he developed coarse afib.  He required cardioversion.  He is now back to his baseline. He has chronic SOB and intermittently requires home O2.   Past Medical History:  Diagnosis Date  . Allergic rhinitis   . Atherosclerosis of native arteries of the extremities with intermittent claudication   . 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 CHF (congestive heart failure) (HCC)   . HTN (hypertension)   . Hypercholesteremia   . PVC's (premature ventricular contractions)   . Tobacco abuse    Past Surgical History:  Procedure Laterality Date  . CARDIOVERSION N/A 08/30/2017   Procedure: CARDIOVERSION;  Surgeon: Chrystie Nose, MD;  Location: [facility] ENDOSCOPY;  Service: Cardiovascular;  Laterality: N/A;  . CARDIOVERSION N/A 01/29/2020   Procedure: CARDIOVERSION;  Surgeon: Laurey Morale, MD;  Location[facility] ENDOSCOPY;  Service: Cardiovascular;  Laterality: N/A;  . CORONARY ARTERY BYPASS GRAFT  2000  . TEE WITHOUT CARDIOVERSION N/A 08/30/2017   Procedure: TRANSESOPHAGEAL ECHOCARDIOGRAM (TEE);  Surgeon: Chrystie Nose, MD;  Location: [facility] ENDOSCOPY;  Service:  Cardiovascular;  Laterality: N/A;     Current Outpatient Medications  Medication Sig Dispense Refill  . acetaminophen (TYLENOL) 500 MG tablet Take 1,000 mg by mouth every 6 (six) hours as needed for mild pain.     Marland Kitchen apixaban (ELIQUIS) 2.5 MG TABS tablet Take 1 tablet (2.5 mg total) by mouth 2 (two) times daily. 60 tablet 6  . atorvastatin (LIPITOR) 80 MG tablet Take 1 tablet (80 mg total) by mouth daily. 90 tablet 1  . colchicine 0.6 MG tablet Take 0.6 mg by mouth daily as needed (for gout flares).     . furosemide (LASIX) 40 MG tablet Take 1.5 tablets (60 mg total) by mouth daily. 45 tablet 6  . metoprolol succinate (TOPROL-XL) 25 MG 24 hr tablet Take 1 tablet (25 mg total) by mouth 2 (two) times daily. 60 tablet 5  . OXYGEN Inhale into the lungs. As needed for SOB    . trolamine salicylate (ASPERCREME) 10 % cream Apply 1 application topically as needed for muscle pain.     No current facility-administered medications for this visit.    Allergies:   Aleve [naproxen]   Social History:  The patient  reports that he quit smoking about 2 years ago. His smoking use included cigars. He smoked 0.50 packs per day. He has never used smokeless tobacco. He reports that he does not drink alcohol and does not use drugs.   Family History:  The patient's  family history includes CVA in his father; Emphysema in his father; Heart disease in his father; Heart failure  in his mother.    ROS:  Please see the history of present illness.   All other systems are personally reviewed and negative.    PHYSICAL EXAM: VS:  BP 116/66   Pulse 79   Ht 5\' 2"  (1.575 m)   Wt 99 lb 9.6 oz (45.2 kg)   SpO2 (!) 88%   BMI 18.22 kg/m  , BMI Body mass index is 18.22 kg/m.  GEN: thin and frail, in no acute distress HEENT: normal Neck: no JVD  Cardiac: RRR; 2/6 SEM LLSB and apex Respiratory:  Few fine crackles, normal work of breathing GI: soft, nontender, nondistended, + BS MS: diffuse atrophy Skin: warm and dry    Neuro:  Strength and sensation are intact Psych: euthymic mood, full affect  EKG:  EKG is ordered today. The ekg ordered today is personally reviewed and shows sinus with PACs/ PVCs   Recent Labs: 04/06/2019: ALT 11 01/26/2020: BUN 20; Creatinine, Ser 2.23; Hemoglobin 13.1; Platelets 381; Potassium 3.9; Sodium 139  personally reviewed   Lipid Panel     Component Value Date/Time   CHOL 119 01/26/2020 1256   CHOL 124 04/06/2019 1612   TRIG 76 01/26/2020 1256   HDL 45 01/26/2020 1256   HDL 50 04/06/2019 1612   CHOLHDL 2.6 01/26/2020 1256   VLDL 15 01/26/2020 1256   LDLCALC 59 01/26/2020 1256   LDLCALC 59 04/06/2019 1612   personally reviewed   Wt Readings from Last 3 Encounters:  01/31/20 99 lb 9.6 oz (45.2 kg)  01/29/20 100 lb (45.4 kg)  01/26/20 99 lb 12.8 oz (45.3 kg)      Other studies personally reviewed: Additional studies/ records that were reviewed today include: office notes, prior ekgs, echo  Review of the above records today demonstrates: as above   ASSESSMENT AND PLAN:  1.  Persistent atrial fibrillation/ atypical (left) atrial flutter The patient has symptomatic but very infrequent atrial arrhythmias.  This occurs in the setting of advanced structural heart disease, severe valvular dysfunction, and advanced lung disease.  ekg from 2019 is reviewed and reveals atypical atrial flutter.  ekg from 01/26/20 is reviewed and reveals coarse afib.    I agree with Dr 13/12/21 that he does not have AAD options.  He has severe lung disease which prevents amiodarone and  qt/ renal dysfunction which prevents tikosyn.  Given his comorbidities, anticipated success with ablation is prohibitively low and anticipated risks of general anesthesia are prohibitively high. Ideally, MV/ TR repair with MAZE would be his best option however risks would also be too high for this.  Fortunately, episodes are infrequent. For now, I would advise long term anticoagulation and rate  control.    2. Ischemic CM/CAD Given advanced lung disease on home O2 and advanced heart failure/ renal failure, he is not an ideal candidate for ICD.  I agree with Dr Shirlee Latch that he currently does not meet criteria for CRT. We discussed ICD at length today.  Using a shared decision making process, he is clear in his decision to decline ICD at this time.  I think that this is the appropriate decision for him.  Risks, benefits and potential toxicities for medications prescribed and/or refilled reviewed with patient today.    Follow-up with Dr Shirlee Latch as scheduled I will see as needed  Current medicines are reviewed at length with the patient today.   The patient does not have concerns regarding his medicines.  The following changes were made today:  none  Labs/ tests  ordered today include:  No orders of the defined types were placed in this encounter.    Randolm Idol, MD  01/31/2020 9:39 AM     [facility] [address] [place] [address] [phone] (office) [phone] (fax)

## 2020-01-31 NOTE — Patient Instructions (Signed)
Medication Instructions:  Your physician recommends that you continue on your current medications as directed. Please refer to the Current Medication list given to you today.  *If you need a refill on your cardiac medications before your next appointment, please call your pharmacy*  Lab Work: None ordered.  If you have labs (blood work) drawn today and your tests are completely normal, you will receive your results only by: . MyChart Message (if you have MyChart) OR . A paper copy in the mail If you have any lab test that is abnormal or we need to change your treatment, we will call you to review the results.  Testing/Procedures: None ordered.  Follow-Up: At [facility], you and your health needs are our priority.  As part of our continuing mission to provide you with exceptional heart care, we have created designated Provider Care Teams.  These Care Teams include your primary Cardiologist (physician) and Advanced Practice Providers (APPs -  Physician Assistants and Nurse Practitioners) who all work together to provide you with the care you need, when you need it.  We recommend signing up for the patient portal called "MyChart".  Sign up information is provided on this After Visit Summary.  MyChart is used to connect with patients for Virtual Visits (Telemedicine).  Patients are able to view lab/test results, encounter notes, upcoming appointments, etc.  Non-urgent messages can be sent to your provider as well.   To learn more about what you can do with MyChart, go to [email or web address].    Your next appointment:   Your physician wants you to follow-up in: as needed    Other Instructions:  

## 2020-02-12 ENCOUNTER — Encounter (HOSPITAL_COMMUNITY): Payer: Self-pay | Admitting: Cardiology

## 2020-02-12 ENCOUNTER — Other Ambulatory Visit: Payer: Self-pay

## 2020-02-12 ENCOUNTER — Ambulatory Visit (HOSPITAL_COMMUNITY)
Admission: RE | Admit: 2020-02-12 | Discharge: 2020-02-12 | Disposition: A | Discharge status: Home / Self Care | Payer: Medicare Other | Source: Ambulatory Visit | Attending: Cardiology | Admitting: Cardiology

## 2020-02-12 VITALS — BP 90/70 | HR 125 | Wt 97.8 lb

## 2020-02-12 DIAGNOSIS — I255 Ischemic cardiomyopathy: Secondary | ICD-10-CM | POA: Diagnosis not present

## 2020-02-12 DIAGNOSIS — E785 Hyperlipidemia, unspecified: Secondary | ICD-10-CM | POA: Diagnosis not present

## 2020-02-12 DIAGNOSIS — I2510 Atherosclerotic heart disease of native coronary artery without angina pectoris: Secondary | ICD-10-CM | POA: Diagnosis not present

## 2020-02-12 DIAGNOSIS — Z7901 Long term (current) use of anticoagulants: Secondary | ICD-10-CM | POA: Diagnosis not present

## 2020-02-12 DIAGNOSIS — Z8249 Family history of ischemic heart disease and other diseases of the circulatory system: Secondary | ICD-10-CM | POA: Diagnosis not present

## 2020-02-12 DIAGNOSIS — Z951 Presence of aortocoronary bypass graft: Secondary | ICD-10-CM | POA: Diagnosis not present

## 2020-02-12 DIAGNOSIS — I5042 Chronic combined systolic (congestive) and diastolic (congestive) heart failure: Secondary | ICD-10-CM

## 2020-02-12 DIAGNOSIS — I484 Atypical atrial flutter: Secondary | ICD-10-CM | POA: Diagnosis not present

## 2020-02-12 DIAGNOSIS — N183 Chronic kidney disease, stage 3 unspecified: Secondary | ICD-10-CM | POA: Insufficient documentation

## 2020-02-12 DIAGNOSIS — J449 Chronic obstructive pulmonary disease, unspecified: Secondary | ICD-10-CM | POA: Diagnosis not present

## 2020-02-12 DIAGNOSIS — Z87891 Personal history of nicotine dependence: Secondary | ICD-10-CM | POA: Insufficient documentation

## 2020-02-12 DIAGNOSIS — I4892 Unspecified atrial flutter: Secondary | ICD-10-CM

## 2020-02-12 DIAGNOSIS — Z79899 Other long term (current) drug therapy: Secondary | ICD-10-CM | POA: Insufficient documentation

## 2020-02-12 DIAGNOSIS — I252 Old myocardial infarction: Secondary | ICD-10-CM | POA: Diagnosis not present

## 2020-02-12 DIAGNOSIS — J8410 Pulmonary fibrosis, unspecified: Secondary | ICD-10-CM | POA: Insufficient documentation

## 2020-02-12 DIAGNOSIS — I130 Hypertensive heart and chronic kidney disease with heart failure and stage 1 through stage 4 chronic kidney disease, or unspecified chronic kidney disease: Secondary | ICD-10-CM | POA: Insufficient documentation

## 2020-02-12 LAB — BASIC METABOLIC PANEL
Anion gap: 12 (ref 5–15)
BUN: 43 mg/dL — ABNORMAL HIGH (ref 8–23)
CO2: 28 mmol/L (ref 22–32)
Calcium: 9.9 mg/dL (ref 8.9–10.3)
Chloride: 98 mmol/L (ref 98–111)
Creatinine, Ser: 2.84 mg/dL — ABNORMAL HIGH (ref 0.61–1.24)
GFR, Estimated: 23 mL/min — ABNORMAL LOW (ref >60)
Glucose, Bld: 156 mg/dL — ABNORMAL HIGH (ref 70–99)
Potassium: 3.5 mmol/L (ref 3.5–5.1)
Sodium: 138 mmol/L (ref 135–145)

## 2020-02-12 MED ORDER — METOPROLOL SUCCINATE ER 50 MG PO TB24: ORAL_TABLET | ORAL | 3 refills | Status: AC

## 2020-02-12 MED ORDER — DIGOXIN 125 MCG PO TABS: 0.0625 mg | ORAL_TABLET | Freq: Every day | ORAL | 3 refills | Status: AC

## 2020-02-12 NOTE — Progress Notes (Signed)
Advanced Heart Failure Clinic Note    PCP: Renford Dills, MD PCP-Cardiologist: No primary care provider on file.  HF cardiologist: Dr Shirlee Latch  HPI: Derrick Marsh is a 74 y.o. male with a history of hypertension, hyperlipidemia, COPD, gout, CAD, CABGin 2000, PAD, tobacco abuse, CKD stage 3, and PVCs.  Admitted 6/14-6/25/19 with atypical atrial flutter and acute systolic HF. Echo showed EF 20-25% with moderate RV dysfunction. He was diuresed with IV lasix. He had AKI on CKD stage 3 with creatinine peak of 3.17.  Creatinine improved with holding diuresis. He required milrinone for low output HF. Underwent successful TEE/DCCV, but procedure complicated by hypotension requiring pressors and respiratory distress requiring intubation. He converted back into atrial flutter the following day, but then converted to NSR with amiodarone. EP did not think he was a good ablation candidate. Transitioned to po amiodarone and Eliquis prior to discharge. He was also treated for PNA. He was discharged with home O2. DC weight: 106 lbs.    Most recent echo in 10/19 showed EF 20-25%, mild LV dilation, PASP 48 mmHg.   PFTs in 1/20 showed moderate-severe restriction with severely decreased DLCO.  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   I saw him in the office in 11/21, he was in atypical atrial flutter with mild RVR.  I arranged for DCCV in 11/21.  He converted back to NSR but with difficulty.  Echo in 11/21 showed EF 25% (on my read) with basal to mid inferolateral, inferior, and anterolateral akinesis and basal inferoseptal akinesis; PASP 78 mmHg; moderate-severe MR (restricted posterior leaflet) suspect infarct-related (probably 3+); moderate-severe TR, mild RV dysfunction, normal IVC.    He saw Dr. Johney Frame and was in NSR still.  He was not thought to be a candidate for ablation.  He did not want an ICD.  Not a good candidate for any anti-arrhythmic.    He returns today for followup of CHF, atrial arrhythmias, and CAD.  He is back in atypical atrial flutter with RVR today.  SBP is stable in 90s.     He says that he felt his heart racing yesterday when walking through the grocery store.  He got tired and had to sit down for about 10 minutes.  Since yesterday, he has been fatigued with exertion (tired walking into the office today).  Not short of breath per se.  Not lightheaded, no syncope.  No chest pain.   ECG (personally reviewed): Atypical atrial flutter with rate 125, left axis deviation  Labs (7/19): TSH normal, LFTs normal, hgb 11.5 Labs (8/19): K 3.7, creatinine 2.29 Labs (10/19): K 3.5, creatinine 2.68, AST 43, ALT normal, TSH normal, hgb 11.2 Labs (1/20): K 4.4, creatinine 2.5, LDL 97, LFTs normal, TSH normal Labs (3/20): K 4.5, creatinine 2.58 Labs (1/21): LDL 59, HDL 50 Labs (11/21): LDL 59, K 3.9, creatinine 2.50, hgb 13.1  Review of systems complete and found to be negative unless listed in HPI.    PMH: 1. CAD: Acute inferior MI in 2000 with CABG => LIMA-LAD, SVG-OM, SVG-RCA.  2. PAD 3. HTN 4. Hyperlipidemia 5. PVCs 6. COPD: Quit smoking in 5/19.  7. Chronic systolic CHF: Ischemic cardiomyopathy.  - Echo (10/19): EF 20-25%, mild LV dilation, diffuse hypokinesis, PASP 48 mmHg.  - Echo (11/21): EF 25% (on my read) with basal to mid inferolateral, inferior, and anterolateral akinesis and basal inferoseptal akinesis; PASP 78 mmHg; moderate-severe MR (restricted posterior leaflet) suspect infarct-related (  probably 3+); moderate-severe TR, mild RV dysfunction, normal IVC.   8. Gout 9. Atrial flutter: Atypical in 6/19, required DCCV and amiodarone.  Seen by EP, not thought to be a good ablation candidate.  - Recurrent atrial flutter 11/21 => DCCV to NSR but back in atrial flutter within about a week.  10. Interstitial lung disease:  - PFTs (1/20): moderate-severe restriction with severely decreased DLCO. - CT chest (1/20):  Emphysema and pulmonary fibrosis consistent with UIP.    Current Outpatient Medications  Medication Sig Dispense Refill   acetaminophen (TYLENOL) 500 MG tablet Take 1,000 mg by mouth every 6 (six) hours as needed for mild pain.      apixaban (ELIQUIS) 2.5 MG TABS tablet Take 1 tablet (2.5 mg total) by mouth 2 (two) times daily. 60 tablet 6   atorvastatin (LIPITOR) 80 MG tablet Take 1 tablet (80 mg total) by mouth daily. 90 tablet 1   colchicine 0.6 MG tablet Take 0.6 mg by mouth daily as needed (for gout flares).      furosemide (LASIX) 40 MG tablet Take 1.5 tablets (60 mg total) by mouth daily. 45 tablet 6   metoprolol succinate (TOPROL-XL) 50 MG 24 hr tablet Take 1 tablet (50 mg total) by mouth in the morning AND 0.5 tablets (25 mg total) every evening. 45 tablet 3   OXYGEN Inhale into the lungs. As needed for SOB     trolamine salicylate (ASPERCREME) 10 % cream Apply 1 application topically as needed for muscle pain.     digoxin (LANOXIN) 0.125 MG tablet Take 0.5 tablets (0.0625 mg total) by mouth daily. 45 tablet 3   No current facility-administered medications for this encounter.    Allergies  Allergen Reactions   Aleve [Naproxen] Other (See Comments)    Was told by a MD to not take this; conflicted with his other meds      Social History   Socioeconomic History   Marital status: Single    Spouse name: Not on file   Number of children: Not on file   Years of education: Not on file   Highest education level: Not on file  Occupational History   Not on file  Tobacco Use   Smoking status: Former Smoker    Packs/day: 0.50    Types: Cigars    Quit date: 07/14/2017    Years since quitting: 2.5   Smokeless tobacco: Never Used  Vaping Use   Vaping Use: Never used  Substance and Sexual Activity   Alcohol use: No   Drug use: No   Sexual activity: Not on file  Other Topics Concern   Not on file  Social History Narrative   Not on file   Social  Determinants of Health   Financial Resource Strain:    Difficulty of Paying Living Expenses: Not on file  Food Insecurity:    Worried About Programme researcher, broadcasting/film/videounning Out of Food in the Last Year: Not on file   [employer] Out of Food in the Last Year: Not on file  Transportation Needs:    Lack of Transportation (Medical): Not on file   Lack of Transportation (Non-Medical): Not on file  Physical Activity:    Days of Exercise per Week: Not on file   Minutes of Exercise per Session: Not on file  Stress:    Feeling of Stress : Not on file  Social Connections:    Frequency of Communication with Friends and Family: Not on file   Frequency of Social Gatherings with Friends  and Family: Not on file   Attends Religious Services: Not on file   Active Member of Clubs or Organizations: Not on file   Attends Banker Meetings: Not on file   Marital Status: Not on file  Intimate Partner Violence:    Fear of Current or Ex-Partner: Not on file   Emotionally Abused: Not on file   Physically Abused: Not on file   Sexually Abused: Not on file      Family History  Problem Relation Age of Onset   Heart disease Father    Emphysema Father    CVA Father    Heart failure Mother     Vitals:   02/12/20 1044  BP: 90/70  Pulse: (!) 125  SpO2: 92%  Weight: 44.4 kg (97 lb 12.8 oz)   Wt Readings from Last 3 Encounters:  02/12/20 44.4 kg (97 lb 12.8 oz)  01/31/20 45.2 kg (99 lb 9.6 oz)  01/29/20 45.4 kg (100 lb)    PHYSICAL EXAM: General: NAD Neck: No JVD, no thyromegaly or thyroid nodule.  Lungs: Clear to auscultation bilaterally with normal respiratory effort. CV: Nondisplaced PMI.  Heart tachy, regular S1/S2, no S3/S4, 2/6 HSM LLSB/apex.  No peripheral edema.  No carotid bruit.  Normal pedal pulses.  Abdomen: Soft, nontender, no hepatosplenomegaly, no distention.  Skin: Intact without lesions or rashes.  Neurologic: Alert and oriented x 3.  Psych: Normal affect. Extremities: No  clubbing or cyanosis.  HEENT: Normal.   ASSESSMENT & PLAN:  1. Chronic systolic HF:  Ischemic cardiomyopathy.  He also has hx of CABG, but unable to do LHC during 6/19 admission due to AKI on CKD stage 3. During that admission, he required milrinone for low output/cardiogenic shock. Echo (6/19) showed EF 20-25%, moderate RV systolic dysfunction. Echo (10/19) showed EF 20-25%, no significant change. Echo in 11/21 with EF 25% (on my read) with basal to mid inferolateral, inferior, and anterolateral akinesis and basal inferoseptal akinesis; PASP 78 mmHg; moderate-severe MR (restricted posterior leaflet) suspect infarct-related (probably 3+); moderate-severe TR, mild RV dysfunction, normal IVC.   NYHA class III symptoms today, primarily fatigue.  He feels worse in atrial flutter.  He does not appear volume overloaded but he is back in atrial flutter with mild RVR. This led to CHF decompensation in the past.   - Continue Lasix 60 mg daily.    - No ACEI/ARB/ARNI/digoxin/spironolactone with CKD.  - Increase Toprol XL to 50 qam/25 qpm for rate control.  - BMET today.  If creatinine improved, will add low dose digoxin.  If creatinine worse, would not start.  - Cannot rule out progression of CAD with ischemic CMP though doubt ACS at 6/19 admission. Would not do coronary angiography at this point with high creatinine unless he has clear ACS.  - Would likely not be candidate for advanced therapies with his degree of CKD.  - He has not wanted an ICD (discussed with Dr. Johney Frame).  2. Atrial flutter: Atypical at 6/19 admission. He had DCCV with recurrence, then started on amiodarone and back in NSR.  He was seen by EP, not thought to be a good ablation candidate. Amiodarone was stopped due to concern for possible amiodarone lung toxicity.  Recurrent atypical flutter in 11/21 with DCCV (with difficulty) back to NSR.  He is now back in atypical flutter with RVR relatively soon post-DCCV.  He has seen Dr. Johney Frame =>  with possible amiodarone toxicity, CHF, CAD, and elevated creatinine, he is not a good candidate  for any anti-arrhythmic; he is not a candidate for ablation.  He would be high risk for AV nodal ablation/CRT.  He feels worse symptomatically when in flutter (and past CHF exacerbation with hospitalization likely triggered by flutter).  - I will increase Toprol XL to 50 mg qam/25 mg qpm for rate control.  - As above, if creatinine is lower again today, will add low dose digoxin for rate control and inotropy.    - Continue eliquis 2.5 mg BID.  - Without an anti-arrhythmic or ablation option, do not think repeat DCCV would be helpful (would likely not last long).  - I discussed his situation with Dr. Johney Frame again.  For now, favor rate control attempt, with AV nodal ablation/CRT reserved for worsening symptoms (would be high risk). He would be willing to undergo ablation/pacing if it would help him feel better.  3. CKD stage 3: Following with Dr Signe Colt now.  - BMET today.   4. CAD: s/p CABG 2000.  No cath since that time. See discussion above, will not cath unless he has significant symptoms.  No chest pain.   - Continue statin, good lipids in 11/21.  - No ASA given stable CAD with Eliquis use.  5. COPD: He has quit smoking since 5/19.  Not on oxygen.  6. 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  I am concerned that amiodarone could have played a role. He is now off amiodarone.  - He should have an appointment in [facility], have referred.  7. Valvular heart disease: Moderate to severe MR on 11/21 echo, likely 3+ and infarct-related. Moderate-severe TR on 11/21 echo.  I suspect he would not benefit markedly from Mitraclip.   I will see him back in 1 week.  We may not have a lot of good options here.  I think that he will do poorly in long-term atrial flutter with RVR and soft  BP makes pushing Toprol XL difficult.  If he is doing worse and still in RVR next week, will need to revisit ablation/CRT (he is willing to do this if needed).   Marca Ancona, MD 02/12/20

## 2020-02-12 NOTE — Patient Instructions (Signed)
INCREASE Toprol XL to 50mg  in the AM and 25mg  in the PM  START Digoxin 0.0625mg  daily  Routine lab work today. Will notify you of abnormal results  Follow up in 1 week with Dr.McLean

## 2020-02-16 ENCOUNTER — Telehealth (HOSPITAL_COMMUNITY): Payer: Self-pay | Admitting: *Deleted

## 2020-02-16 NOTE — Telephone Encounter (Signed)
Pts daughter called to inform the clinic that patient passed away 04-Mar-2020. Dr.McLean aware

## 2020-02-22 ENCOUNTER — Encounter (HOSPITAL_COMMUNITY): Payer: Medicare Other | Admitting: Cardiology

## 2020-03-16 DEATH — deceased
